# Patient Record
Sex: Female | Born: 1985 | Race: White | Hispanic: No | Marital: Married | Smoking: Never smoker
Health system: Southern US, Community
[De-identification: ages and names within clinical notes are randomized; demographics above are authoritative.]

## PROBLEM LIST (undated history)

## (undated) DIAGNOSIS — IMO0002 Reserved for concepts with insufficient information to code with codable children: Secondary | ICD-10-CM

## (undated) DIAGNOSIS — R51 Headache: Secondary | ICD-10-CM

## (undated) DIAGNOSIS — F4310 Post-traumatic stress disorder, unspecified: Secondary | ICD-10-CM

## (undated) DIAGNOSIS — Z8619 Personal history of other infectious and parasitic diseases: Secondary | ICD-10-CM

## (undated) DIAGNOSIS — G8929 Other chronic pain: Secondary | ICD-10-CM

## (undated) DIAGNOSIS — F329 Major depressive disorder, single episode, unspecified: Secondary | ICD-10-CM

## (undated) DIAGNOSIS — F909 Attention-deficit hyperactivity disorder, unspecified type: Secondary | ICD-10-CM

## (undated) DIAGNOSIS — J45909 Unspecified asthma, uncomplicated: Secondary | ICD-10-CM

## (undated) DIAGNOSIS — N390 Urinary tract infection, site not specified: Secondary | ICD-10-CM

## (undated) DIAGNOSIS — R519 Headache, unspecified: Secondary | ICD-10-CM

## (undated) DIAGNOSIS — E039 Hypothyroidism, unspecified: Secondary | ICD-10-CM

## (undated) DIAGNOSIS — M797 Fibromyalgia: Secondary | ICD-10-CM

## (undated) DIAGNOSIS — F32A Depression, unspecified: Secondary | ICD-10-CM

## (undated) DIAGNOSIS — M329 Systemic lupus erythematosus, unspecified: Secondary | ICD-10-CM

## (undated) DIAGNOSIS — E079 Disorder of thyroid, unspecified: Secondary | ICD-10-CM

## (undated) DIAGNOSIS — F419 Anxiety disorder, unspecified: Secondary | ICD-10-CM

## (undated) HISTORY — DX: Anxiety disorder, unspecified: F41.9

## (undated) HISTORY — DX: Personal history of other infectious and parasitic diseases: Z86.19

## (undated) HISTORY — DX: Major depressive disorder, single episode, unspecified: F32.9

## (undated) HISTORY — DX: Unspecified asthma, uncomplicated: J45.909

## (undated) HISTORY — DX: Depression, unspecified: F32.A

---
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
## 2007-01-01 ENCOUNTER — Encounter: Admission: RE | Admit: 2007-01-01 | Discharge: 2007-01-01 | Payer: Self-pay | Admitting: Internal Medicine

## 2007-09-30 ENCOUNTER — Emergency Department (HOSPITAL_COMMUNITY): Admission: EM | Admit: 2007-09-30 | Discharge: 2007-09-30 | Payer: Self-pay | Admitting: Emergency Medicine

## 2008-09-28 ENCOUNTER — Emergency Department (HOSPITAL_COMMUNITY): Admission: EM | Admit: 2008-09-28 | Discharge: 2008-09-28 | Payer: Self-pay | Admitting: Emergency Medicine

## 2010-06-16 ENCOUNTER — Encounter: Payer: Self-pay | Admitting: Internal Medicine

## 2010-09-03 LAB — COMPREHENSIVE METABOLIC PANEL
ALT: 11 U/L (ref 0–35)
AST: 19 U/L (ref 0–37)
Albumin: 3.8 g/dL (ref 3.5–5.2)
Alkaline Phosphatase: 61 U/L (ref 39–117)
Creatinine, Ser: 0.61 mg/dL (ref 0.4–1.2)
GFR calc Af Amer: >60 mL/min (ref >60)
Total Bilirubin: 0.4 mg/dL (ref 0.3–1.2)

## 2010-09-03 LAB — URINALYSIS, ROUTINE W REFLEX MICROSCOPIC
Glucose, UA: NEGATIVE mg/dL
Hgb urine dipstick: NEGATIVE
Specific Gravity, Urine: 1.028 (ref 1.005–1.030)
Urobilinogen, UA: 0.2 mg/dL (ref 0.0–1.0)

## 2010-09-03 LAB — CBC
HCT: 31.8 % — ABNORMAL LOW (ref 36.0–46.0)
Hemoglobin: 10.8 g/dL — ABNORMAL LOW (ref 12.0–15.0)
MCHC: 34.1 g/dL (ref 30.0–36.0)
MCV: 82.5 fL (ref 78.0–100.0)
Platelets: 276 10*3/uL (ref 150–400)
RBC: 3.85 MIL/uL — ABNORMAL LOW (ref 3.87–5.11)
RDW: 16.2 % — ABNORMAL HIGH (ref 11.5–15.5)
WBC: 9.8 10*3/uL (ref 4.0–10.5)

## 2010-09-03 LAB — DIFFERENTIAL
Basophils Absolute: 0.1 10*3/uL (ref 0.0–0.1)
Basophils Relative: 1 % (ref 0–1)
Eosinophils Absolute: 0.1 10*3/uL (ref 0.0–0.7)
Monocytes Absolute: 0.5 10*3/uL (ref 0.1–1.0)
Monocytes Relative: 5 % (ref 3–12)
Neutrophils Relative %: 73 % (ref 43–77)

## 2010-09-03 LAB — PREGNANCY, URINE: Preg Test, Ur: NEGATIVE

## 2010-09-03 LAB — LIPASE, BLOOD: Lipase: 22 U/L (ref 11–59)

## 2010-10-11 NOTE — Discharge Summary (Signed)
NAME:  ALAYSIA, LIGHTLE                         ACCOUNT NO.:  [id number]   MEDICAL RECORD NO.:  [id number]                   PATIENT TYPE:  INP   LOCATION:  9320                                 FACILITY:  [facility]   PHYSICIAN:  Charles A. Clearance Coots, M.D.             DATE OF BIRTH:  [birth year]   DATE OF ADMISSION:  01/11/2004  DATE OF DISCHARGE:  01/15/2004                                 DISCHARGE SUMMARY   ADMISSION DIAGNOSIS:  Right pyelonephritis.   DISCHARGE DIAGNOSIS:  Right pyelonephritis, status post intravenous  antibiotic therapy.  Discharged home much improved, in good condition.   REASON FOR ADMISSION:  A 25 year old white female, G0, who presents with  report of hematuria 1-1/2 weeks ago.  She was given Uristat by her sister  and felt better until the day of admission, when she complained of severe  back pain, right flank pain, nausea, and fever.   PAST MEDICAL HISTORY:  Surgery:  None.  Illnesses:  None.   MEDICATIONS:  Advil.   ALLERGIES:  No known drug allergies.   SOCIAL HISTORY:  Occasional tobacco.  Marijuana two weeks ago.   PHYSICAL EXAMINATION:  GENERAL:  A well-nourished, well-developed white  female in moderate distress.  VITAL SIGNS:  Temperature 102.3, pulse 118, respiratory rate 18, blood  pressure 110/53.  CHEST:  Lungs were clear to auscultation bilaterally.  CARDIAC:  Regular rate and rhythm.  ABDOMEN:  Soft, mild tenderness over the suprapubic.  BACK:  Positive right CVA tenderness.   ADMISSION LABORATORY DATA:  Hemoglobin 9.9, hematocrit 30, white blood cell  count 22,000, platelets 428,000.  Urinalysis:  Specific gravity 1.015,  hemoglobin trace, ketones 40, moderate leukocyte esterase.   HOSPITAL COURSE:  The patient was admitted and started on IV antibiotic  therapy.  She was much improved by hospital day #3 with no flank pain and no  fever, and she was discharged home on hospital day #4 with no flank pain and  afebrile for 48 hours.   DISCHARGE  LABORATORY DATA:  Urine culture:  75,000 E. coli, pansensitive.  Blood cultures are negative.  White blood cell count 18,800.   DISCHARGE DISPOSITION:  1. Medications:  Ceftin 500 mg twice a day for seven days.  2. Follow-up in the office in two weeks for scheduling of a urology     consultation.                                               Charles A. Clearance Coots, M.D.    CAH/MEDQ  D:  01/15/2004  T:  01/15/2004  Job:  [id number]

## 2012-05-08 ENCOUNTER — Emergency Department (HOSPITAL_COMMUNITY): Payer: BC Managed Care – PPO

## 2012-05-08 ENCOUNTER — Emergency Department (HOSPITAL_COMMUNITY)
Admission: EM | Admit: 2012-05-08 | Discharge: 2012-05-08 | Disposition: A | Discharge status: Home / Self Care | Payer: BC Managed Care – PPO | Attending: Emergency Medicine | Admitting: Emergency Medicine

## 2012-05-08 ENCOUNTER — Encounter (HOSPITAL_COMMUNITY): Payer: Self-pay | Admitting: Emergency Medicine

## 2012-05-08 DIAGNOSIS — R300 Dysuria: Secondary | ICD-10-CM | POA: Insufficient documentation

## 2012-05-08 DIAGNOSIS — R110 Nausea: Secondary | ICD-10-CM | POA: Insufficient documentation

## 2012-05-08 DIAGNOSIS — R109 Unspecified abdominal pain: Secondary | ICD-10-CM | POA: Insufficient documentation

## 2012-05-08 LAB — URINALYSIS, ROUTINE W REFLEX MICROSCOPIC
Glucose, UA: NEGATIVE mg/dL
Leukocytes, UA: NEGATIVE
Protein, ur: NEGATIVE mg/dL

## 2012-05-08 LAB — PREGNANCY, URINE: Preg Test, Ur: NEGATIVE

## 2012-05-08 LAB — POCT I-STAT, CHEM 8
Calcium, Ion: 1.22 mmol/L (ref 1.12–1.23)
Chloride: 106 mEq/L (ref 96–112)
HCT: 37 % (ref 36.0–46.0)
Hemoglobin: 12.6 g/dL (ref 12.0–15.0)
TCO2: 24 mmol/L (ref 0–100)

## 2012-05-08 LAB — POCT PREGNANCY, URINE: Preg Test, Ur: NEGATIVE

## 2012-05-08 MED ORDER — FENTANYL CITRATE 0.05 MG/ML IJ SOLN
100.0000 ug | Freq: Once | INTRAMUSCULAR | Status: DC
  Filled 2012-05-08: qty 2

## 2012-05-08 MED ORDER — HYDROCODONE-ACETAMINOPHEN 5-325 MG PO TABS
1.0000 | ORAL_TABLET | Freq: Once | ORAL | Status: AC
  Administered 2012-05-08: 1 via ORAL
  Filled 2012-05-08: qty 1

## 2012-05-08 MED ORDER — ONDANSETRON HCL 4 MG/2ML IJ SOLN
4.0000 mg | Freq: Once | INTRAMUSCULAR | Status: DC
  Filled 2012-05-08: qty 2

## 2012-05-08 MED ORDER — ONDANSETRON 4 MG PO TBDP
ORAL_TABLET | ORAL | Status: AC
  Administered 2012-05-08: 4 mg
  Filled 2012-05-08: qty 1

## 2012-05-08 MED ORDER — HYDROCODONE-ACETAMINOPHEN 5-325 MG PO TABS: 1.0000 | ORAL_TABLET | Freq: Four times a day (QID) | ORAL | Status: DC | PRN

## 2012-05-08 NOTE — ED Provider Notes (Signed)
History     CSN: [id number] Arrival date & time 05/08/12  1740 First MD Initiated Contact with Patient 05/08/12 1822      Chief Complaint  Patient presents with  . Flank Pain     HPI Pt had some dysuria a few days ago.  Prior to that she had a "stomach bug" but that resolved. She saw a PCP and was started on abx for a UTI 2 days ago. Since then  she has started to have pain in the left flank which was not there initially.  No fever.  No vomiting.  Still some nausea.  She has been trying [employer] [place] poweder witout relief.  History reviewed. No pertinent past medical history.  History reviewed. No pertinent past surgical history.  No family history on file.  History  Substance Use Topics  . Smoking status: Never Smoker   . Smokeless tobacco: Never Used  . Alcohol Use: Yes     Comment: rarely    OB History    Grav Para Term Preterm Abortions TAB SAB Ect Mult Living                  Review of Systems  All other systems reviewed and are negative.    Allergies  Review of patient's allergies indicates no known allergies.  Home Medications   Current Outpatient Rx  Name  Route  Sig  Dispense  Refill  . BUPROPION HCL ER (XL) 300 MG PO TB24   Oral   Take 300 mg by mouth daily.         Marland Kitchen CIPROFLOXACIN HCL 500 MG PO TABS   Oral   Take 500 mg by mouth 2 (two) times daily. For 10 days. Started on 05-06-12         . DROSPIRENONE-ETHINYL ESTRADIOL 3-0.03 MG PO TABS   Oral   Take 1 tablet by mouth daily.         Marland Kitchen ONDANSETRON 4 MG PO TBDP   Oral   Take 4 mg by mouth every 8 (eight) hours as needed. Nausea           BP 137/86  Pulse 103  Temp 98.5 F (36.9 C) (Oral)  Resp 14  SpO2 100%  LMP 03/21/2012  Physical Exam  Nursing note and vitals reviewed. Constitutional: She appears well-developed and well-nourished. No distress.  HENT:  Head: Normocephalic and atraumatic.  Right Ear: External ear normal.  Left Ear: External ear normal.  Eyes: Conjunctivae  normal are normal. Right eye exhibits no discharge. Left eye exhibits no discharge. No scleral icterus.  Neck: Neck supple. No tracheal deviation present.  Cardiovascular: Normal rate, regular rhythm and intact distal pulses.   Pulmonary/Chest: Effort normal and breath sounds normal. No stridor. No respiratory distress. She has no wheezes. She has no rales.  Abdominal: Soft. Bowel sounds are normal. She exhibits no distension. There is no tenderness. There is CVA tenderness. There is no rebound and no guarding.  Musculoskeletal: She exhibits no edema and no tenderness.  Neurological: She is alert. She has normal strength. No sensory deficit. Cranial nerve deficit:  no gross defecits noted. She exhibits normal muscle tone. She displays no seizure activity. Coordination normal.  Skin: Skin is warm and dry. No rash noted.  Psychiatric: She has a normal mood and affect.    ED Course  Procedures (including critical care time)  Labs Reviewed  URINALYSIS, ROUTINE W REFLEX MICROSCOPIC - Abnormal; Notable for the following:    Specific Gravity,  Urine 1.004 (*)     All other components within normal limits  POCT I-STAT, CHEM 8 - Abnormal; Notable for the following:    BUN 5 (*)     All other components within normal limits  POCT PREGNANCY, URINE  PREGNANCY, URINE   Ct Abdomen Pelvis Wo Contrast  05/08/2012  *RADIOLOGY REPORT*  Clinical Data: Left flank pain question kidney stone, negative pregnancy test  CT ABDOMEN AND PELVIS WITHOUT CONTRAST  Technique:  Multidetector CT imaging of the abdomen and pelvis was performed following the standard protocol without intravenous contrast. Sagittal and coronal MPR images reconstructed from axial data set.  Comparison: 09/05/2005  Findings: Lung bases clear. Tiny nonobstructing right renal calculi. Questionable tiny nonobstructing mid left renal calculus coronal image 38. No additional urinary tract calcification, hydronephrosis, or ureteral dilatation. Within  limits of a nonenhanced exam, liver, spleen, pancreas, kidneys, and adrenal glands unremarkable.  Normal appendix. Unremarkable bladder, uterus and adnexae. Stomach and bowel loops unremarkable for technique. Tiny amount of nonspecific free pelvic fluid. No definite mass, adenopathy, free air or inflammatory process. Few scattered normal-sized mesenteric nodes identified. No hernia or acute bony lesions.  IMPRESSION: Probable tiny bilateral nonobstructing renal calculi. No definite ureteral calcification or hydroureteronephrosis identified.   Original Report Authenticated By: Ulyses Southward, M.D.     Chief complaint: Flank Pain   MDM  The patient presented to the emergency room with complaints of persistent flank pain. Her urinalysis is reassuring. CT scan does not show evidence of a definite ureteral stone. She does have probable tiny bilateral nonobstructing renal calculi but I do not think that would be related to the flank pain that she is having today. At this time there does not appear to be evidence of an acute emergency medical condition. I will have her continue her antibiotics. I will give her prescription for medications take for pain        Celene Kras, MD 05/08/12 2034

## 2012-05-08 NOTE — ED Notes (Signed)
Pt presents w/ left flank pain, starting 2 days ago, nausea w/ emesis but has been getting over a stomach. Bug. Pt Dx at [facility] w/ UTI on Wednesday and has been taking her antibx, her urgency and frequency have subsided, flank pain is a new Sx

## 2012-05-08 NOTE — ED Notes (Signed)
Informed MD about pt's pain level.

## 2013-02-03 ENCOUNTER — Emergency Department (HOSPITAL_COMMUNITY)
Admission: EM | Admit: 2013-02-03 | Discharge: 2013-02-04 | Disposition: A | Discharge status: Home / Self Care | Payer: BC Managed Care – PPO | Attending: Emergency Medicine | Admitting: Emergency Medicine

## 2013-02-03 ENCOUNTER — Emergency Department (HOSPITAL_COMMUNITY): Payer: BC Managed Care – PPO

## 2013-02-03 ENCOUNTER — Encounter (HOSPITAL_COMMUNITY): Payer: Self-pay | Admitting: *Deleted

## 2013-02-03 DIAGNOSIS — H81399 Other peripheral vertigo, unspecified ear: Secondary | ICD-10-CM | POA: Insufficient documentation

## 2013-02-03 DIAGNOSIS — H9319 Tinnitus, unspecified ear: Secondary | ICD-10-CM | POA: Insufficient documentation

## 2013-02-03 DIAGNOSIS — Z3202 Encounter for pregnancy test, result negative: Secondary | ICD-10-CM | POA: Insufficient documentation

## 2013-02-03 DIAGNOSIS — Z79899 Other long term (current) drug therapy: Secondary | ICD-10-CM | POA: Insufficient documentation

## 2013-02-03 DIAGNOSIS — R112 Nausea with vomiting, unspecified: Secondary | ICD-10-CM | POA: Insufficient documentation

## 2013-02-03 DIAGNOSIS — J329 Chronic sinusitis, unspecified: Secondary | ICD-10-CM | POA: Insufficient documentation

## 2013-02-03 DIAGNOSIS — J3489 Other specified disorders of nose and nasal sinuses: Secondary | ICD-10-CM | POA: Insufficient documentation

## 2013-02-03 HISTORY — DX: Other chronic pain: G89.29

## 2013-02-03 HISTORY — DX: Headache, unspecified: R51.9

## 2013-02-03 HISTORY — DX: Headache: R51

## 2013-02-03 LAB — URINALYSIS, ROUTINE W REFLEX MICROSCOPIC
Bilirubin Urine: NEGATIVE
Hgb urine dipstick: NEGATIVE
Ketones, ur: NEGATIVE mg/dL
Protein, ur: NEGATIVE mg/dL
Urobilinogen, UA: 0.2 mg/dL (ref 0.0–1.0)

## 2013-02-03 MED ORDER — PROMETHAZINE HCL 25 MG/ML IJ SOLN
12.5000 mg | Freq: Once | INTRAMUSCULAR | Status: AC
  Administered 2013-02-03: 12.5 mg via INTRAVENOUS
  Filled 2013-02-03: qty 1

## 2013-02-03 MED ORDER — SODIUM CHLORIDE 0.9 % IV BOLUS (SEPSIS)
500.0000 mL | Freq: Once | INTRAVENOUS | Status: AC
  Administered 2013-02-03: 500 mL via INTRAVENOUS

## 2013-02-03 MED ORDER — SODIUM CHLORIDE 0.9 % IV SOLN: INTRAVENOUS | Status: DC

## 2013-02-03 MED ORDER — PROMETHAZINE HCL 25 MG PO TABS: 25.0000 mg | ORAL_TABLET | Freq: Four times a day (QID) | ORAL | Status: DC | PRN

## 2013-02-03 NOTE — ED Notes (Signed)
Patient transported to CT 

## 2013-02-03 NOTE — ED Notes (Signed)
Pt states today started having a headache, nausea/vomiting, and ears ringing.

## 2013-02-03 NOTE — ED Provider Notes (Signed)
CSN: [id number]     Arrival date & time 02/03/13  1718 History   First MD Initiated Contact with Patient 02/03/13 2127     Chief Complaint  Patient presents with  . Tinnitus  . Nausea  . Emesis  . Headache    HPI Pt was seen at 2140. Per pt, c/o gradual onset and persistence of constant "dizziness" for the past 1 week. Pt describes the dizziness as "everything is moving around." Has been associated with nausea, "ringing" in her ears, frontal headache, sinus and nasal congestion. Symptoms worsen when she changes body positions. Denies headache, no focal motor weakness, no tingling/numnbess in extremities, no visual changes, no CP/palpitations, no SOB/cough, no abd pain, no vomiting/diarrhea, no fevers, no rash, no head or neck injury, no syncope/near syncope.    Past Medical History  Diagnosis Date  . Chronic headache     History reviewed. No pertinent past surgical history.  History  Substance Use Topics  . Smoking status: Never Smoker   . Smokeless tobacco: Never Used  . Alcohol Use: Yes     Comment: rarely    Review of Systems ROS: Statement: All systems negative except as marked or noted in the HPI; Constitutional: Negative for fever and chills. ; ; Eyes: Negative for eye pain, redness and discharge. ; ; ENMT: Negative for ear pain, hoarseness, sore throat. +nasal congestion, sinus pressure, ears "ringing.";; Cardiovascular: Negative for chest pain, palpitations, diaphoresis, dyspnea and peripheral edema. ; ; Respiratory: Negative for cough, wheezing and stridor. ; ; Gastrointestinal: +nausea. Negative for vomiting, diarrhea, abdominal pain, blood in stool, hematemesis, jaundice and rectal bleeding. . ; ; Genitourinary: Negative for dysuria, flank pain and hematuria. ; ; Musculoskeletal: Negative for back pain and neck pain. Negative for swelling and trauma.; ; Skin: Negative for pruritus, rash, abrasions, blisters, bruising and skin lesion.; ; Neuro: +"dizziness." Negative for  lightheadedness and neck stiffness. Negative for weakness, altered level of consciousness , altered mental status, extremity weakness, paresthesias, involuntary movement, seizure and syncope.      Allergies  Review of patient's allergies indicates no known allergies.  Home Medications   Current Outpatient Rx  Name  Route  Sig  Dispense  Refill  . aspirin-acetaminophen-caffeine (EXCEDRIN MIGRAINE) 250-250-65 MG per tablet   Oral   Take 2 tablets by mouth every 6 (six) hours as needed for pain.         Marland Kitchen buPROPion (WELLBUTRIN XL) 300 MG 24 hr tablet   Oral   Take 300 mg by mouth daily.         . drospirenone-ethinyl estradiol (OCELLA) 3-0.03 MG tablet   Oral   Take 1 tablet by mouth daily.         Marland Kitchen HYDROcodone-acetaminophen (NORCO) 5-325 MG per tablet   Oral   Take 1-2 tablets by mouth every 6 (six) hours as needed for pain.   20 tablet   0    BP 135/92  Pulse 87  Temp(Src) 98.6 F (37 C) (Oral)  Resp 20  Ht 4\' 11"  (1.499 m)  Wt 98 lb (44.453 kg)  BMI 19.78 kg/m2  SpO2 100%  LMP 01/27/2013 Physical Exam 2145: Physical examination:  Nursing notes reviewed; Vital signs and O2 SAT reviewed;  Constitutional: Well developed, Well nourished, Well hydrated, In no acute distress; Head:  Normocephalic, atraumatic; Eyes: EOMI, PERRL, No scleral icterus; ENMT: TM's clear bilat. +edemetous nasal turbinates bilat with clear rhinorrhea.  Mouth and pharynx normal, Mucous membranes moist; Neck: Supple, Full range of  motion, No lymphadenopathy; Cardiovascular: Regular rate and rhythm, No murmur, rub, or gallop; Respiratory: Breath sounds clear & equal bilaterally, No rales, rhonchi, wheezes.  Speaking full sentences with ease, Normal respiratory effort/excursion; Chest: Nontender, Movement normal; Abdomen: Soft, Nontender, Nondistended, Normal bowel sounds; Genitourinary: No CVA tenderness; Extremities: Pulses normal, No tenderness, No edema, No calf edema or asymmetry.; Neuro: AA&Ox3,  Major CN grossly intact.  Strength 5/5 equal bilat UE's and LE's.  DTR 2/4 equal bilat UE's and LE's.  No gross sensory deficits.  Normal cerebellar testing bilat UE's (finger-nose) and LE's (heel-shin). Speech clear.  No facial droop.  +left horizontal end gaze fatigable nystagmus which reproduces pts symptoms.;; Skin: Color normal, Warm, Dry.   ED Course  Procedures    MDM  MDM Reviewed: previous chart, nursing note and vitals Interpretation: labs and CT scan     Results for orders placed during the hospital encounter of 02/03/13  PREGNANCY, URINE      Result Value Range   Preg Test, Ur NEGATIVE  NEGATIVE  URINALYSIS, ROUTINE W REFLEX MICROSCOPIC      Result Value Range   Color, Urine YELLOW  YELLOW   APPearance CLEAR  CLEAR   Specific Gravity, Urine 1.017  1.005 - 1.030   pH 6.5  5.0 - 8.0   Glucose, UA NEGATIVE  NEGATIVE mg/dL   Hgb urine dipstick NEGATIVE  NEGATIVE   Bilirubin Urine NEGATIVE  NEGATIVE   Ketones, ur NEGATIVE  NEGATIVE mg/dL   Protein, ur NEGATIVE  NEGATIVE mg/dL   Urobilinogen, UA 0.2  0.0 - 1.0 mg/dL   Nitrite NEGATIVE  NEGATIVE   Leukocytes, UA NEGATIVE  NEGATIVE   Ct Head Wo Contrast 02/03/2013   CLINICAL DATA:  Tinnitus, nausea, emesis, and headache.  EXAM: CT HEAD WITHOUT CONTRAST  TECHNIQUE: Contiguous axial images were obtained from the base of the skull through the vertex without intravenous contrast.  COMPARISON:  09/05/2005  FINDINGS: Ventricles and sulci appear symmetrical. No mass effect or midline shift. No abnormal extra-axial fluid collections. Gray-white matter junctions are distinct. Basal cisterns are not effaced. No evidence of acute intracranial hemorrhage. No depressed skull fractures. Visualized paranasal sinuses demonstrate mucosal thickening without acute air-fluid level. Mastoid air cells are not opacified.  IMPRESSION: No acute intracranial abnormalities.   Electronically Signed   By: Burman Nieves   On: 02/03/2013 22:43    2345:   Pt has ambulated while in the ED with steady gait. Denies "dizziness." States nausea has improved. Has tol PO well while in the ED without N/V. Wants to go home now. Dx and testing d/w pt and family.  Questions answered.  Verb understanding, agreeable to d/c home with outpt f/u.   Laray Anger, DO 02/07/13 [phone]

## 2013-02-03 NOTE — ED Notes (Signed)
Pt ambulated in hall with standby assist.  Pt denies any N/V or dizziness.

## 2013-02-03 NOTE — ED Notes (Signed)
Patient now consenting to CT. Cory in CT notified.

## 2013-02-03 NOTE — ED Notes (Signed)
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 Patient reports trying various home remedies such as lavander oil and nausea bands without relief. Patient feeling increased stress due to lack of insurance and concern on how she will pay for today's visit. Patient seen by Amy, Case Management.

## 2013-02-03 NOTE — ED Notes (Signed)
Pt states that standing still makes her more dizzy than walking around.

## 2013-02-03 NOTE — Progress Notes (Signed)
Patient confirms that she does not have a pcp.  Patient also reports that she does not have insurance anymore.  Patient confirms that she takes Welbutrin and sometimes has difficulty paying for it.  [facility] provided patient with a list of pcps who accept self pay patients, list of discount pharmacies and website needymeds.org fro medication assistance.  Patient reports she tried applying for Obama care and Medicaid but was unsuccessful.  EDCM provided patient information on the orange card.  Informed patient that the orange card is not insurance.  Also provided patient with information on the MAP program and infromed patient that she must find a pcp to be eligible for this program.  Patient verbalized understanding.  Patient thankful for resources.  No further needs at this time.

## 2013-03-09 ENCOUNTER — Ambulatory Visit: Payer: Self-pay | Attending: Internal Medicine

## 2013-03-09 ENCOUNTER — Ambulatory Visit: Payer: Self-pay | Attending: Internal Medicine | Admitting: Internal Medicine

## 2013-03-09 VITALS — BP 126/85 | HR 102 | Temp 98.2°F | Resp 17 | Wt 97.6 lb

## 2013-03-09 DIAGNOSIS — J329 Chronic sinusitis, unspecified: Secondary | ICD-10-CM | POA: Insufficient documentation

## 2013-03-09 DIAGNOSIS — J45909 Unspecified asthma, uncomplicated: Secondary | ICD-10-CM | POA: Insufficient documentation

## 2013-03-09 DIAGNOSIS — H8110 Benign paroxysmal vertigo, unspecified ear: Secondary | ICD-10-CM

## 2013-03-09 MED ORDER — MECLIZINE HCL 25 MG PO TABS: 25.0000 mg | ORAL_TABLET | Freq: Three times a day (TID) | ORAL | Status: DC | PRN

## 2013-03-09 MED ORDER — LORATADINE 10 MG PO TABS: 10.0000 mg | ORAL_TABLET | Freq: Every day | ORAL | Status: DC | PRN

## 2013-03-09 NOTE — Progress Notes (Signed)
Patient ID: Valerie Evans, female   DOB: 1985/06/13, 27 y.o.   MRN: [id number] Patient Demographics  Valerie Evans, is a 27 y.o. female  WUJ:[id number]  [id number]  DOB - Oct 06, 1985  Chief Complaint  Patient presents with  . Hospitalization Follow-up        Subjective:   Valerie Evans today is here to establish primary care.  The patient is a 27 year old female with history of asthma, only uses it is cueing he had as needed, was seen in the [facility] ED for vertigo. Patient reports that at that time she had sinusitis, was given Phenergan which did improve her symptoms of nausea however she felt sleepy and affecting schoolwork (currently in nursing school). She states that the right ear discomfort and the vertigo has improved.   Patient has No headache, No chest pain, No abdominal pain - No Nausea, No new weakness tingling or numbness, No Cough - SOB.    Objective:    Filed Vitals:   03/09/13 1030  BP: 126/85  Pulse: 102  Temp: 98.2 F (36.8 C)  Resp: 17  Weight: 97 lb 9.6 oz (44.271 kg)  SpO2: 100%     ALLERGIES:  No Known Allergies  PAST MEDICAL HISTORY: Past Medical History  Diagnosis Date  . Chronic headache     PAST SURGICAL HISTORY: No past surgical history on file.  FAMILY HISTORY: No family history on file.  MEDICATIONS AT HOME: Prior to Admission medications   Medication Sig Start Date End Date Taking? Authorizing Provider  aspirin-acetaminophen-caffeine (EXCEDRIN MIGRAINE) [phone] MG per tablet Take 2 tablets by mouth every 6 (six) hours as needed for pain.    Historical Provider, MD  buPROPion (WELLBUTRIN XL) 300 MG 24 hr tablet Take 300 mg by mouth daily.    Historical Provider, MD  drospirenone-ethinyl estradiol (OCELLA) 3-0.03 MG tablet Take 1 tablet by mouth daily.    Historical Provider, MD  HYDROcodone-acetaminophen (NORCO) 5-325 MG per tablet Take 1-2 tablets by mouth every 6 (six) hours as needed for pain. 05/08/12   Celene Kras, MD   loratadine (CLARITIN) 10 MG tablet Take 1 tablet (10 mg total) by mouth daily as needed for allergies. 03/09/13   Ripudeep Jenna Luo, MD  meclizine (ANTIVERT) 25 MG tablet Take 1 tablet (25 mg total) by mouth 3 (three) times daily as needed for dizziness (vertigo, also available OTC). 03/09/13   Ripudeep Jenna Luo, MD  promethazine (PHENERGAN) 25 MG tablet Take 1 tablet (25 mg total) by mouth every 6 (six) hours as needed for nausea (or dizziness). 02/03/13   Laray Anger, DO    REVIEW OF SYSTEMS:  Constitutional:   No   Fevers, chills, fatigue.  HEENT:    No headaches, Sore throat,   Cardio-vascular: No chest pain,  Orthopnea, swelling in lower extremities, anasarca, palpitations  GI:  No abdominal pain, nausea, vomiting, diarrhea  Resp: No shortness of breath,  No coughing up of blood.No cough.No wheezing.  Skin:  no rash or lesions.  GU:  no dysuria, change in color of urine, no urgency or frequency.  No flank pain.  Musculoskeletal: No joint pain or swelling.  No decreased range of motion.  No back pain.  Psych: No change in mood or affect. No depression or anxiety.  No memory loss.   Exam  General appearance :Awake, alert, NAD, Speech Clear. HEENT: Atraumatic and Normocephalic, PERLA both ears clean no significant discharge or tenderness.  Neck: supple, no JVD. No cervical lymphadenopathy.  Chest:  clear to auscultation bilaterally, no wheezing, rales or rhonchi CVS: S1 S2 regular, no murmurs.  Abdomen: soft, NBS, NT, ND, no gaurding, rigidity or rebound. Extremities: No cyanosis, clubbing, B/L Lower Ext shows no edema,  Neurology: Awake alert, and oriented X 3, CN II-XII intact, Non focal Skin:No Rash or lesions Wounds: N/A    Data Review   Basic Metabolic Panel: No results found for this basename: NA, K, CL, CO2, GLUCOSE, BUN, CREATININE, CALCIUM, MG, PHOS,  in the last 168 hours Liver Function Tests: No results found for this basename: AST, ALT, ALKPHOS,  BILITOT, PROT, ALBUMIN,  in the last 168 hours  CBC: No results found for this basename: WBC, NEUTROABS, HGB, HCT, MCV, PLT,  in the last 168 hours ------------------------------------------------------------------------------------------------------------------ No results found for this basename: HGBA1C,  in the last 72 hours ------------------------------------------------------------------------------------------------------------------ No results found for this basename: CHOL, HDL, LDLCALC, TRIG, CHOLHDL, LDLDIRECT,  in the last 72 hours ------------------------------------------------------------------------------------------------------------------ No results found for this basename: TSH, T4TOTAL, FREET3, T3FREE, THYROIDAB,  in the last 72 hours ------------------------------------------------------------------------------------------------------------------ No results found for this basename: VITAMINB12, FOLATE, FERRITIN, TIBC, IRON, RETICCTPCT,  in the last 72 hours  Coagulation profile  No results found for this basename: INR, PROTIME,  in the last 168 hours    Assessment & Plan   Active Problems: Sinusitis with vertigo - Place on claritin and meclizine PRN  History of chronic asthma: Currently stable - States that she does not need any albuterol inhaler at this time, has many doses and she does not use it daily  Health screening: Patient reports that she had a Pap smear this summer - States that she has received Flu shot through the school  Follow-up in 3 months PRN   RAI,RIPUDEEP M.D. 03/09/2013, 10:42 AM

## 2013-03-09 NOTE — Progress Notes (Signed)
Patient here for follow up from ED Was diagnosed with vertigo

## 2013-03-14 ENCOUNTER — Encounter (HOSPITAL_COMMUNITY): Payer: Self-pay | Admitting: Emergency Medicine

## 2013-03-14 ENCOUNTER — Emergency Department (INDEPENDENT_AMBULATORY_CARE_PROVIDER_SITE_OTHER)
Admission: EM | Admit: 2013-03-14 | Discharge: 2013-03-14 | Disposition: A | Discharge status: Home / Self Care | Payer: Self-pay | Source: Home / Self Care | Attending: Family Medicine | Admitting: Family Medicine

## 2013-03-14 DIAGNOSIS — F411 Generalized anxiety disorder: Secondary | ICD-10-CM

## 2013-03-14 DIAGNOSIS — F419 Anxiety disorder, unspecified: Secondary | ICD-10-CM

## 2013-03-14 LAB — POCT I-STAT, CHEM 8
BUN: 12 mg/dL (ref 6–23)
Sodium: 141 mEq/L (ref 135–145)
TCO2: 25 mmol/L (ref 0–100)

## 2013-03-14 MED ORDER — ALPRAZOLAM 0.25 MG PO TABS: 0.2500 mg | ORAL_TABLET | Freq: Three times a day (TID) | ORAL | Status: DC | PRN

## 2013-03-14 NOTE — ED Notes (Signed)
C/o feeling shaky, nausea, rapid heart rate, difficulty speaking, working hard to breath. States also experienced same symptoms yesterday morning. Hx of anxiety.  Pt has taking meclizine with some relief of symptoms.

## 2013-03-14 NOTE — ED Provider Notes (Signed)
CSN: [id number]     Arrival date & time 03/14/13  1141 History   First MD Initiated Contact with Patient 03/14/13 1348     Chief Complaint  Patient presents with  . Anxiety   (Consider location/radiation/quality/duration/timing/severity/associated sxs/prior Treatment) Patient is a 27 y.o. female presenting with anxiety. The history is provided by the patient. No language interpreter was used.  Anxiety This is a new problem. The current episode started 2 days ago. The problem occurs constantly. Associated symptoms include shortness of breath. Pertinent negatives include no chest pain. Nothing aggravates the symptoms. Nothing relieves the symptoms. She has tried nothing for the symptoms.  Pt complains of feeling tremulous yesterday.    Past Medical History  Diagnosis Date  . Chronic headache    History reviewed. No pertinent past surgical history. History reviewed. No pertinent family history. History  Substance Use Topics  . Smoking status: Never Smoker   . Smokeless tobacco: Never Used  . Alcohol Use: Yes     Comment: rarely   OB History   Grav Para Term Preterm Abortions TAB SAB Ect Mult Living                 Review of Systems  Respiratory: Positive for shortness of breath.   Cardiovascular: Negative for chest pain.  All other systems reviewed and are negative.    Allergies  Review of patient's allergies indicates no known allergies.  Home Medications   Current Outpatient Rx  Name  Route  Sig  Dispense  Refill  . buPROPion (WELLBUTRIN XL) 300 MG 24 hr tablet   Oral   Take 300 mg by mouth daily.         . drospirenone-ethinyl estradiol (OCELLA) 3-0.03 MG tablet   Oral   Take 1 tablet by mouth daily.         Marland Kitchen HYDROcodone-acetaminophen (NORCO) 5-325 MG per tablet   Oral   Take 1-2 tablets by mouth every 6 (six) hours as needed for pain.   20 tablet   0   . meclizine (ANTIVERT) 25 MG tablet   Oral   Take 1 tablet (25 mg total) by mouth 3 (three) times  daily as needed for dizziness (vertigo, also available OTC).   60 tablet   3   . aspirin-acetaminophen-caffeine (EXCEDRIN MIGRAINE) 250-250-65 MG per tablet   Oral   Take 2 tablets by mouth every 6 (six) hours as needed for pain.         Marland Kitchen loratadine (CLARITIN) 10 MG tablet   Oral   Take 1 tablet (10 mg total) by mouth daily as needed for allergies.   30 tablet   5   . promethazine (PHENERGAN) 25 MG tablet   Oral   Take 1 tablet (25 mg total) by mouth every 6 (six) hours as needed for nausea (or dizziness).   8 tablet   0    BP 126/87  Pulse 108  Temp(Src) 97.2 F (36.2 C) (Oral)  Resp 16  SpO2 100%  LMP 03/07/2013 Physical Exam  Nursing note and vitals reviewed. Constitutional: She is oriented to person, place, and time. She appears well-developed and well-nourished.  HENT:  Head: Normocephalic.  Right Ear: External ear normal.  Left Ear: External ear normal.  Nose: Nose normal.  Mouth/Throat: Oropharynx is clear and moist.  Eyes: Conjunctivae and EOM are normal. Pupils are equal, round, and reactive to light.  Neck: Normal range of motion. Neck supple.  Cardiovascular: Normal rate.   Pulmonary/Chest: Effort  normal.  Musculoskeletal: Normal range of motion.  Neurological: She is alert and oriented to person, place, and time. She has normal reflexes.  Skin: Skin is warm.  Psychiatric: She has a normal mood and affect.    ED Course  Procedures (including critical care time) Labs Review Labs Reviewed  POCT I-STAT, CHEM 8 - Abnormal; Notable for the following:    Calcium, Ion 1.26 (*)    Hemoglobin 15.6 (*)    All other components within normal limits   Imaging Review No results found.  EKG Interpretation     Ventricular Rate:    PR Interval:    QRS Duration:   QT Interval:    QTC Calculation:   R Axis:     Text Interpretation:              MDM   1. Anxiety     I advised pt to stop sudafed.  Encourage fluids.  Lonia Skinner [place],  PA-C 03/14/13 [phone]

## 2013-03-14 NOTE — ED Notes (Addendum)
Was asked to eval this pt on arrival by front office clerk. Pt c/o no energy, heart racing, feels hard to breathe, fingers and toes are tingling, arms shaking; w/d/color good NAD; alert conversant; denies pain; pt to wait in lobby until a bed is later available

## 2013-03-16 NOTE — ED Provider Notes (Signed)
Medical screening examination/treatment/procedure(s) were performed by resident physician or non-physician practitioner and as supervising physician I was immediately available for consultation/collaboration.   Lima Chillemi DOUGLAS MD.   Jariyah Hackley D Kaitlynn Tramontana, MD 03/16/13 2059 

## 2013-03-17 ENCOUNTER — Encounter: Payer: Self-pay | Admitting: Internal Medicine

## 2013-03-17 ENCOUNTER — Ambulatory Visit: Payer: No Typology Code available for payment source | Attending: Internal Medicine | Admitting: Internal Medicine

## 2013-03-17 VITALS — BP 118/76 | HR 89 | Temp 97.8°F | Ht 60.0 in | Wt 97.6 lb

## 2013-03-17 DIAGNOSIS — R4589 Other symptoms and signs involving emotional state: Secondary | ICD-10-CM

## 2013-03-17 DIAGNOSIS — F410 Panic disorder [episodic paroxysmal anxiety] without agoraphobia: Secondary | ICD-10-CM

## 2013-03-17 DIAGNOSIS — F411 Generalized anxiety disorder: Secondary | ICD-10-CM

## 2013-03-17 LAB — TSH: TSH: 2.304 u[IU]/mL (ref 0.350–4.500)

## 2013-03-17 MED ORDER — ALPRAZOLAM 0.25 MG PO TABS: 0.2500 mg | ORAL_TABLET | Freq: Three times a day (TID) | ORAL | Status: DC | PRN

## 2013-03-17 NOTE — Patient Instructions (Signed)

## 2013-03-17 NOTE — Progress Notes (Signed)
Patient ID: Valerie Evans, female   DOB: Jun 26, 1985, 27 y.o.   MRN: [id number] Patient Demographics  Valerie Evans, is a 27 y.o. female  WUJ:[id number]  [id number]  DOB - 07/18/1985  Chief Complaint  Patient presents with  . Anxiety        Subjective:   Valerie Evans is a 27 y.o. female here today for a follow up visit. Patient was seen in the ER recently because of panic attacks. 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 She is due to graduate in December, she is also looking for job, no distinct constitute stress giving her panic attacks. She holds after all this done she will be free of anxiety. She has history of depression. He is on Wellbutrin by psychiatrist. Today she has no symptoms. She is here for followup of ER visit. Patient has No headache, No chest pain, No abdominal pain - No Nausea, No new weakness tingling or numbness, No Cough - SOB.  ALLERGIES: No Known Allergies  PAST MEDICAL HISTORY: Past Medical History  Diagnosis Date  . Chronic headache     MEDICATIONS AT HOME: Prior to Admission medications   Medication Sig Start Date End Date Taking? Authorizing Provider  ALPRAZolam (XANAX) 0.25 MG tablet Take 1 tablet (0.25 mg total) by mouth 3 (three) times daily as needed for anxiety. 03/17/13  Yes Jeanann Lewandowsky, MD  aspirin-acetaminophen-caffeine (EXCEDRIN MIGRAINE) [phone] MG per tablet Take 2 tablets by mouth every 6 (six) hours as needed for pain.   Yes Historical Provider, MD  buPROPion (WELLBUTRIN XL) 300 MG 24 hr tablet Take 300 mg by mouth daily.   Yes Historical Provider, MD  drospirenone-ethinyl estradiol (OCELLA) 3-0.03 MG tablet Take 1 tablet by mouth daily.   Yes Historical Provider, MD  HYDROcodone-acetaminophen (NORCO) 5-325 MG per tablet Take 1-2 tablets by mouth every 6 (six) hours as needed for pain. 05/08/12  Yes Celene Kras, MD  loratadine (CLARITIN) 10 MG tablet Take 1 tablet (10 mg total)  by mouth daily as needed for allergies. 03/09/13  Yes Ripudeep Jenna Luo, MD  meclizine (ANTIVERT) 25 MG tablet Take 1 tablet (25 mg total) by mouth 3 (three) times daily as needed for dizziness (vertigo, also available OTC). 03/09/13  Yes Ripudeep Jenna Luo, MD  promethazine (PHENERGAN) 25 MG tablet Take 1 tablet (25 mg total) by mouth every 6 (six) hours as needed for nausea (or dizziness). 02/03/13  Yes Laray Anger, DO     Objective:   Filed Vitals:   03/17/13 0938  BP: 118/76  Pulse: 89  Temp: 97.8 F (36.6 C)  TempSrc: Oral  Height: 5' (1.524 m)  Weight: 97 lb 9.6 oz (44.271 kg)  SpO2: 100%    Exam General appearance : Awake, alert, not in any distress. Speech Clear. Not toxic looking HEENT: Atraumatic and Normocephalic, pupils equally reactive to light and accomodation Neck: supple, no JVD. No cervical lymphadenopathy.  Chest:Good air entry bilaterally, no added sounds  CVS: S1 S2 regular, no murmurs.  Abdomen: Bowel sounds present, Non tender and not distended with no gaurding, rigidity or rebound. Extremities: B/L Lower Ext shows no edema, both legs are warm to touch Neurology: Awake alert, and oriented X 3, CN II-XII intact, Non focal Skin:No Rash Wounds:N/A   Data Review   CBC  Recent Labs Lab 03/14/13 1509  HGB 15.6*  HCT 46.0    Chemistries    Recent Labs Lab 03/14/13 1509  NA  141  K 4.2  CL 103  GLUCOSE 80  BUN 12  CREATININE 1.00   ------------------------------------------------------------------------------------------------------------------ No results found for this basename: HGBA1C,  in the last 72 hours ------------------------------------------------------------------------------------------------------------------ No results found for this basename: CHOL, HDL, LDLCALC, TRIG, CHOLHDL, LDLDIRECT,  in the last 72 hours ------------------------------------------------------------------------------------------------------------------ No  results found for this basename: TSH, T4TOTAL, FREET3, T3FREE, THYROIDAB,  in the last 72 hours ------------------------------------------------------------------------------------------------------------------ No results found for this basename: VITAMINB12, FOLATE, FERRITIN, TIBC, IRON, RETICCTPCT,  in the last 72 hours  Coagulation profile  No results found for this basename: INR, PROTIME,  in the last 168 hours    Assessment & Plan   Patient Active Problem List   Diagnosis Date Noted  . Panic attack as reaction to stress 03/17/2013  . Generalized anxiety disorder 03/17/2013  . Sinusitis, chronic 03/09/2013  . Asthma, chronic 03/09/2013  . Benign paroxysmal positional vertigo 03/09/2013     Plan: Xanax 0.25 mg tablet by mouth 3 times a day when necessary anxiety Patient has been extensively counseled about panic attacks, behavioral modification therapy for anxiety  Labs: TSH  Follow up in 3 months when necessary  The patient was given clear instructions to go to ER or return to medical center if symptoms don't improve, worsen or new problems develop. The patient verbalized understanding. The patient was told to call to get lab results if they haven't heard anything in the next week.    Jeanann Lewandowsky, MD, MHA, FACP, FAAP [facility] [place], [place] [phone]   03/17/2013, 10:08 AM

## 2013-03-18 ENCOUNTER — Telehealth: Payer: Self-pay

## 2013-03-18 NOTE — Telephone Encounter (Signed)
Message copied by Lestine Mount on Fri Mar 18, 2013 11:32 AM ------      Message from: Quentin Angst      Created: Fri Mar 18, 2013  9:37 AM       Please inform patient that her TSH is normal ------

## 2013-03-18 NOTE — Telephone Encounter (Signed)
Patient is aware of her lab results 

## 2013-03-31 ENCOUNTER — Ambulatory Visit: Payer: No Typology Code available for payment source | Attending: Internal Medicine

## 2013-03-31 VITALS — BP 123/86 | HR 87 | Temp 98.7°F | Resp 16

## 2013-03-31 DIAGNOSIS — F410 Panic disorder [episodic paroxysmal anxiety] without agoraphobia: Secondary | ICD-10-CM

## 2013-03-31 DIAGNOSIS — F411 Generalized anxiety disorder: Secondary | ICD-10-CM

## 2013-03-31 MED ORDER — ALPRAZOLAM 0.25 MG PO TABS: 0.2500 mg | ORAL_TABLET | Freq: Three times a day (TID) | ORAL | Status: DC | PRN

## 2013-03-31 NOTE — Progress Notes (Unsigned)
Pt here for medication refill Xanax for severe anxiety Pt prescribed #30 10/23. Has 9 pills left. Taking 3/day

## 2013-04-10 ENCOUNTER — Emergency Department (INDEPENDENT_AMBULATORY_CARE_PROVIDER_SITE_OTHER)
Admission: EM | Admit: 2013-04-10 | Discharge: 2013-04-10 | Disposition: A | Discharge status: Home / Self Care | Payer: No Typology Code available for payment source | Source: Home / Self Care

## 2013-04-10 ENCOUNTER — Encounter (HOSPITAL_COMMUNITY): Payer: Self-pay | Admitting: Emergency Medicine

## 2013-04-10 DIAGNOSIS — R300 Dysuria: Secondary | ICD-10-CM

## 2013-04-10 DIAGNOSIS — N390 Urinary tract infection, site not specified: Secondary | ICD-10-CM

## 2013-04-10 HISTORY — DX: Urinary tract infection, site not specified: N39.0

## 2013-04-10 LAB — POCT URINALYSIS DIP (DEVICE)
Glucose, UA: 100 mg/dL — AB
Nitrite: POSITIVE — AB
Specific Gravity, Urine: <=1.005 (ref 1.005–1.030)
Urobilinogen, UA: 1 mg/dL (ref 0.0–1.0)

## 2013-04-10 MED ORDER — NITROFURANTOIN MONOHYD MACRO 100 MG PO CAPS: 100.0000 mg | ORAL_CAPSULE | Freq: Two times a day (BID) | ORAL | Status: DC

## 2013-04-10 NOTE — ED Provider Notes (Signed)
Medical screening examination/treatment/procedure(s) were performed by non-physician practitioner and as supervising physician I was immediately available for consultation/collaboration.  Leslee Home, M.D.   Reuben Likes, MD 04/10/13 [phone]

## 2013-04-10 NOTE — ED Notes (Signed)
Reports history of uti.  Patient has taken azo this am.  Reports noticing painful urination and frequency yesterday and symptoms have worsened.  Denies back pain, denies abdominal pain.

## 2013-04-10 NOTE — ED Provider Notes (Signed)
CSN: [id number]     Arrival date & time 04/10/13  0920 History   None    Chief Complaint  Patient presents with  . Urinary Tract Infection   (Consider location/radiation/quality/duration/timing/severity/associated sxs/prior Treatment) HPI Comments: Patient presents with a 2-3 day history of dysuria, hematuria and urgency. She denies abdominal or flank pain. No N or V. No CVA pain. No fever or chills.   Patient is a 27 y.o. female presenting with urinary tract infection. The history is provided by the patient.  Urinary Tract Infection    Past Medical History  Diagnosis Date  . Chronic headache   . UTI (lower urinary tract infection)    History reviewed. No pertinent past surgical history. History reviewed. No pertinent family history. History  Substance Use Topics  . Smoking status: Never Smoker   . Smokeless tobacco: Never Used  . Alcohol Use: Yes     Comment: rarely   OB History   Grav Para Term Preterm Abortions TAB SAB Ect Mult Living                 Review of Systems  All other systems reviewed and are negative.    Allergies  Review of patient's allergies indicates no known allergies.  Home Medications   Current Outpatient Rx  Name  Route  Sig  Dispense  Refill  . AMITRIPTYLINE HCL PO   Oral   Take by mouth.         . drospirenone-ethinyl estradiol (OCELLA) 3-0.03 MG tablet   Oral   Take 1 tablet by mouth daily.         Marland Kitchen HYDROcodone-acetaminophen (NORCO) 5-325 MG per tablet   Oral   Take 1-2 tablets by mouth every 6 (six) hours as needed for pain.   20 tablet   0   . meclizine (ANTIVERT) 25 MG tablet   Oral   Take 1 tablet (25 mg total) by mouth 3 (three) times daily as needed for dizziness (vertigo, also available OTC).   60 tablet   3   . ALPRAZolam (XANAX) 0.25 MG tablet   Oral   Take 1 tablet (0.25 mg total) by mouth 3 (three) times daily as needed for anxiety.   15 tablet   0   . aspirin-acetaminophen-caffeine (EXCEDRIN MIGRAINE)  250-250-65 MG per tablet   Oral   Take 2 tablets by mouth every 6 (six) hours as needed for pain.         Marland Kitchen buPROPion (WELLBUTRIN XL) 300 MG 24 hr tablet   Oral   Take 300 mg by mouth daily.         Marland Kitchen loratadine (CLARITIN) 10 MG tablet   Oral   Take 1 tablet (10 mg total) by mouth daily as needed for allergies.   30 tablet   5   . nitrofurantoin, macrocrystal-monohydrate, (MACROBID) 100 MG capsule   Oral   Take 1 capsule (100 mg total) by mouth 2 (two) times daily.   10 capsule   0   . promethazine (PHENERGAN) 25 MG tablet   Oral   Take 1 tablet (25 mg total) by mouth every 6 (six) hours as needed for nausea (or dizziness).   8 tablet   0    BP 113/69  Pulse 93  Temp(Src) 98.5 F (36.9 C) (Oral)  Resp 19  SpO2 95%  LMP 03/31/2013 Physical Exam  Nursing note and vitals reviewed. Constitutional: She is oriented to person, place, and time. She appears well-developed and well-nourished.  HENT:  Head: Normocephalic and atraumatic.  Cardiovascular: Normal rate and regular rhythm.   Pulmonary/Chest: Effort normal and breath sounds normal.  Abdominal: Soft. She exhibits no distension. There is no rebound and no guarding.  Neurological: She is alert and oriented to person, place, and time.  Skin: Skin is warm and dry.  Psychiatric: Her behavior is normal.    ED Course  Procedures (including critical care time) Labs Review Labs Reviewed  POCT URINALYSIS DIP (DEVICE) - Abnormal; Notable for the following:    Glucose, UA 100 (*)    Hgb urine dipstick SMALL (*)    Nitrite POSITIVE (*)    Leukocytes, UA SMALL (*)    All other components within normal limits   Imaging Review No results found.  EKG Interpretation     Ventricular Rate:    PR Interval:    QRS Duration:   QT Interval:    QTC Calculation:   R Axis:     Text Interpretation:              MDM   1. UTI (lower urinary tract infection)   2. Dysuria   Treat with Nitrofurantoin-Push  fluids     Riki Sheer, PA-C 04/10/13 1032

## 2013-04-20 ENCOUNTER — Encounter (HOSPITAL_COMMUNITY): Payer: Self-pay | Admitting: Emergency Medicine

## 2013-04-20 ENCOUNTER — Emergency Department (INDEPENDENT_AMBULATORY_CARE_PROVIDER_SITE_OTHER)
Admission: EM | Admit: 2013-04-20 | Discharge: 2013-04-20 | Disposition: A | Discharge status: Home / Self Care | Payer: No Typology Code available for payment source | Source: Home / Self Care

## 2013-04-20 DIAGNOSIS — R300 Dysuria: Secondary | ICD-10-CM

## 2013-04-20 DIAGNOSIS — N390 Urinary tract infection, site not specified: Secondary | ICD-10-CM

## 2013-04-20 LAB — POCT URINALYSIS DIP (DEVICE)
Ketones, ur: NEGATIVE mg/dL
Protein, ur: NEGATIVE mg/dL
Specific Gravity, Urine: 1.01 (ref 1.005–1.030)
pH: 7 (ref 5.0–8.0)

## 2013-04-20 MED ORDER — CEPHALEXIN 500 MG PO CAPS: 500.0000 mg | ORAL_CAPSULE | Freq: Four times a day (QID) | ORAL | Status: DC

## 2013-04-20 NOTE — ED Provider Notes (Signed)
CSN: [id number]     Arrival date & time 04/20/13  0807 History   None    No chief complaint on file.  (Consider location/radiation/quality/duration/timing/severity/associated sxs/prior Treatment) HPI Comments: 27 year old female was at this urgent care 10 days ago for UTI. She was treated with nitrofurantoin for 5 days. She is  continuing to have some dysuria ,left flank discomfort and mild suprapubic discomfort. Denies urgency.    Past Medical History  Diagnosis Date  . Chronic headache   . UTI (lower urinary tract infection)    No past surgical history on file. No family history on file. History  Substance Use Topics  . Smoking status: Never Smoker   . Smokeless tobacco: Never Used  . Alcohol Use: Yes     Comment: rarely   OB History   Grav Para Term Preterm Abortions TAB SAB Ect Mult Living                 Review of Systems  Constitutional: Negative.  Negative for fever, chills, activity change and fatigue.  Respiratory: Negative.   Gastrointestinal: Negative.   Genitourinary: Positive for dysuria and flank pain. Negative for hematuria, vaginal discharge, difficulty urinating, vaginal pain, menstrual problem and pelvic pain.  Musculoskeletal: Negative.   Neurological: Negative.     Allergies  Review of patient's allergies indicates no known allergies.  Home Medications   Current Outpatient Rx  Name  Route  Sig  Dispense  Refill  . ALPRAZolam (XANAX) 0.25 MG tablet   Oral   Take 1 tablet (0.25 mg total) by mouth 3 (three) times daily as needed for anxiety.   15 tablet   0   . AMITRIPTYLINE HCL PO   Oral   Take by mouth.         Marland Kitchen aspirin-acetaminophen-caffeine (EXCEDRIN MIGRAINE) 250-250-65 MG per tablet   Oral   Take 2 tablets by mouth every 6 (six) hours as needed for pain.         Marland Kitchen buPROPion (WELLBUTRIN XL) 300 MG 24 hr tablet   Oral   Take 300 mg by mouth daily.         . cephALEXin (KEFLEX) 500 MG capsule   Oral   Take 1 capsule (500 mg  total) by mouth 4 (four) times daily.   28 capsule   0   . drospirenone-ethinyl estradiol (OCELLA) 3-0.03 MG tablet   Oral   Take 1 tablet by mouth daily.         Marland Kitchen HYDROcodone-acetaminophen (NORCO) 5-325 MG per tablet   Oral   Take 1-2 tablets by mouth every 6 (six) hours as needed for pain.   20 tablet   0   . loratadine (CLARITIN) 10 MG tablet   Oral   Take 1 tablet (10 mg total) by mouth daily as needed for allergies.   30 tablet   5   . meclizine (ANTIVERT) 25 MG tablet   Oral   Take 1 tablet (25 mg total) by mouth 3 (three) times daily as needed for dizziness (vertigo, also available OTC).   60 tablet   3   . nitrofurantoin, macrocrystal-monohydrate, (MACROBID) 100 MG capsule   Oral   Take 1 capsule (100 mg total) by mouth 2 (two) times daily.   10 capsule   0   . promethazine (PHENERGAN) 25 MG tablet   Oral   Take 1 tablet (25 mg total) by mouth every 6 (six) hours as needed for nausea (or dizziness).   8 tablet  0    BP 162/85  Pulse 72  Temp(Src) 98.9 F (37.2 C) (Oral)  Resp 18  SpO2 98%  LMP 03/31/2013 Physical Exam  Nursing note and vitals reviewed. Constitutional: She is oriented to person, place, and time. She appears well-developed and well-nourished. No distress.  Eyes: EOM are normal.  Neck: Normal range of motion. Neck supple.  Cardiovascular: Normal rate.   Pulmonary/Chest: Effort normal. No respiratory distress.  Abdominal: Soft. She exhibits no distension and no mass. There is no tenderness. There is no rebound and no guarding.  Musculoskeletal: She exhibits no edema and no tenderness.  Neurological: She is alert and oriented to person, place, and time.  Skin: Skin is warm and dry.  Psychiatric: She has a normal mood and affect.    ED Course  Procedures (including critical care time) Labs Review Labs Reviewed  POCT URINALYSIS DIP (DEVICE) - Abnormal; Notable for the following:    Hgb urine dipstick TRACE (*)    All other  components within normal limits  URINE CULTURE   Imaging Review No results found. Results for orders placed during the hospital encounter of 04/20/13  POCT URINALYSIS DIP (DEVICE)      Result Value Range   Glucose, UA NEGATIVE  NEGATIVE mg/dL   Bilirubin Urine NEGATIVE  NEGATIVE   Ketones, ur NEGATIVE  NEGATIVE mg/dL   Specific Gravity, Urine 1.010  1.005 - 1.030   Hgb urine dipstick TRACE (*) NEGATIVE   pH 7.0  5.0 - 8.0   Protein, ur NEGATIVE  NEGATIVE mg/dL   Urobilinogen, UA 0.2  0.0 - 1.0 mg/dL   Nitrite NEGATIVE  NEGATIVE   Leukocytes, UA NEGATIVE  NEGATIVE      MDM   1. Dysuria      Urine culture ordered. Pt continues to have sx's with essentially nl urine. Suspect the course of ABX may have been short or not completely bacteriostatic but was effective to an extent. Keflex 500 qid for 7 d   Hayden Rasmussen, NP 04/20/13 4540  Hayden Rasmussen, NP 04/20/13 [phone]

## 2013-04-20 NOTE — ED Notes (Signed)
C/o UTI  Patient was here recently and could tell UTI wasn't getting better Antibiotics was given and finished.

## 2013-04-20 NOTE — ED Provider Notes (Signed)
Medical screening examination/treatment/procedure(s) were performed by non-physician practitioner and as supervising physician I was immediately available for consultation/collaboration.  Azalie Harbeck, M.D.  Imberly Troxler C Earla Charlie, MD 04/20/13 0902 

## 2013-04-21 LAB — URINE CULTURE: Colony Count: NO GROWTH

## 2013-06-09 ENCOUNTER — Ambulatory Visit: Payer: No Typology Code available for payment source | Attending: Internal Medicine | Admitting: Internal Medicine

## 2013-06-09 ENCOUNTER — Encounter: Payer: Self-pay | Admitting: Internal Medicine

## 2013-06-09 VITALS — BP 118/86 | HR 98 | Temp 98.0°F | Resp 16 | Ht 59.0 in | Wt 102.0 lb

## 2013-06-09 DIAGNOSIS — F430 Acute stress reaction: Secondary | ICD-10-CM

## 2013-06-09 DIAGNOSIS — F410 Panic disorder [episodic paroxysmal anxiety] without agoraphobia: Secondary | ICD-10-CM | POA: Insufficient documentation

## 2013-06-09 DIAGNOSIS — F411 Generalized anxiety disorder: Secondary | ICD-10-CM | POA: Insufficient documentation

## 2013-06-09 DIAGNOSIS — R4589 Other symptoms and signs involving emotional state: Secondary | ICD-10-CM

## 2013-06-09 MED ORDER — ALPRAZOLAM 0.25 MG PO TABS: 0.2500 mg | ORAL_TABLET | Freq: Three times a day (TID) | ORAL | Status: DC | PRN

## 2013-06-09 NOTE — Patient Instructions (Signed)

## 2013-06-09 NOTE — Progress Notes (Signed)
Patient ID: Valerie Evans, female   DOB: 03/08/1986, 28 y.o.   MRN: [id number] Patient Demographics  Valerie Evans, is a 28 y.o. female  [id number]  [id number]  DOB - 02/14/1986  Chief Complaint  Patient presents with  . Follow-up        Subjective:   Valerie Evans is a 28 y.o. female here today for a follow up visit. Patient is known to have generalized anxiety disorder, panic attacks and depression. She is here for followup. She has an exam coming up and she is being very anxious, having palpitations. She claims to have a support system now in a boyfriend, she was seen in the mental health department recently, her Wellbutrin was changed to amitriptyline, she claims amitriptyline is not working as well. She doesn't sleep well, she has headaches when she does not have a good sleep. 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 Patient has No headache, No chest pain, No abdominal pain - No Nausea, No new weakness tingling or numbness, No Cough - SOB.  ALLERGIES: No Known Allergies  PAST MEDICAL HISTORY: Past Medical History  Diagnosis Date  . Chronic headache   . UTI (lower urinary tract infection)   . Anxiety   . Depression     MEDICATIONS AT HOME: Prior to Admission medications   Medication Sig Start Date End Date Taking? Authorizing Provider  ALPRAZolam (XANAX) 0.25 MG tablet Take 1 tablet (0.25 mg total) by mouth 3 (three) times daily as needed for anxiety. 06/09/13  Yes Jeanann Lewandowskylugbemiga Damoney Julia, MD  AMITRIPTYLINE HCL PO Take by mouth.   Yes Historical Provider, MD  aspirin-acetaminophen-caffeine (EXCEDRIN MIGRAINE) [phone] MG per tablet Take 2 tablets by mouth every 6 (six) hours as needed for pain.   Yes Historical Provider, MD  drospirenone-ethinyl estradiol (OCELLA) 3-0.03 MG tablet Take 1 tablet by mouth daily.   Yes Historical Provider, MD  loratadine (CLARITIN) 10 MG tablet Take 1 tablet (10 mg total) by mouth daily as needed for allergies. 03/09/13  Yes  Ripudeep Jenna LuoK Rai, MD  meclizine (ANTIVERT) 25 MG tablet Take 1 tablet (25 mg total) by mouth 3 (three) times daily as needed for dizziness (vertigo, also available OTC). 03/09/13  Yes Ripudeep Jenna LuoK Rai, MD  buPROPion (WELLBUTRIN XL) 300 MG 24 hr tablet Take 300 mg by mouth daily.    Historical Provider, MD  cephALEXin (KEFLEX) 500 MG capsule Take 1 capsule (500 mg total) by mouth 4 (four) times daily. 04/20/13   Hayden Rasmussenavid Mabe, NP  HYDROcodone-acetaminophen (NORCO) 5-325 MG per tablet Take 1-2 tablets by mouth every 6 (six) hours as needed for pain. 05/08/12   Celene KrasJon R Knapp, MD  nitrofurantoin, macrocrystal-monohydrate, (MACROBID) 100 MG capsule Take 1 capsule (100 mg total) by mouth 2 (two) times daily. 04/10/13   Riki SheerMichelle G Young, PA-C  promethazine (PHENERGAN) 25 MG tablet Take 1 tablet (25 mg total) by mouth every 6 (six) hours as needed for nausea (or dizziness). 02/03/13   Laray AngerKathleen M McManus, DO     Objective:   Filed Vitals:   06/09/13 1014  BP: 118/86  Pulse: 98  Temp: 98 F (36.7 C)  TempSrc: Oral  Resp: 16  Height: 4\' 11"  (1.499 m)  Weight: 102 lb (46.267 kg)  SpO2: 100%    Exam General appearance : Awake, alert, not in any distress. Speech Clear. Not toxic looking HEENT: Atraumatic and Normocephalic, pupils equally reactive to light and accomodation Neck: supple, no JVD. No cervical lymphadenopathy.  Chest:Good air  entry bilaterally, no added sounds  CVS: S1 S2 regular, no murmurs.  Abdomen: Bowel sounds present, Non tender and not distended with no gaurding, rigidity or rebound. Extremities: B/L Lower Ext shows no edema, both legs are warm to touch Neurology: Awake alert, and oriented X 3, CN II-XII intact, Non focal Skin:No Rash Wounds:N/A   Data Review   CBC No results found for this basename: WBC, HGB, HCT, PLT, MCV, MCH, MCHC, RDW, NEUTRABS, LYMPHSABS, MONOABS, EOSABS, BASOSABS, BANDABS, BANDSABD,  in the last 168 hours  Chemistries   No results found for this  basename: NA, K, CL, CO2, GLUCOSE, BUN, CREATININE, GFRCGP, CALCIUM, MG, AST, ALT, ALKPHOS, BILITOT,  in the last 168 hours ------------------------------------------------------------------------------------------------------------------ No results found for this basename: HGBA1C,  in the last 72 hours ------------------------------------------------------------------------------------------------------------------ No results found for this basename: CHOL, HDL, LDLCALC, TRIG, CHOLHDL, LDLDIRECT,  in the last 72 hours ------------------------------------------------------------------------------------------------------------------ No results found for this basename: TSH, T4TOTAL, FREET3, T3FREE, THYROIDAB,  in the last 72 hours ------------------------------------------------------------------------------------------------------------------ No results found for this basename: VITAMINB12, FOLATE, FERRITIN, TIBC, IRON, RETICCTPCT,  in the last 72 hours  Coagulation profile  No results found for this basename: INR, PROTIME,  in the last 168 hours    Assessment & Plan   1. Panic attack as reaction to stress  - ALPRAZolam (XANAX) 0.25 MG tablet; Take 1 tablet (0.25 mg total) by mouth 3 (three) times daily as needed for anxiety.  Dispense: 30 tablet; Refill: 0  2. Generalized anxiety disorder  - ALPRAZolam (XANAX) 0.25 MG tablet; Take 1 tablet (0.25 mg total) by mouth 3 (three) times daily as needed for anxiety.  Dispense: 30 tablet; Refill: 0  Patient was again encouraged to follow mental health department Patient was extensively counseled about nutrition and exercise  patient was encouraged to engage in more social activities  Follow up in 6 months or when necessary   The patient was given clear instructions to go to ER or return to medical center if symptoms don't improve, worsen or new problems develop. The patient verbalized understanding. The patient was told to call to get lab  results if they haven't heard anything in the next week.    Jeanann Lewandowsky, MD, MHA, FACP, FAAP [facility] [place], [place] [phone]   06/09/2013, 10:40 AM

## 2013-06-09 NOTE — Progress Notes (Signed)
Pt is here following up on her anxiety and depression.

## 2013-07-04 ENCOUNTER — Encounter (HOSPITAL_COMMUNITY): Payer: Self-pay | Admitting: Emergency Medicine

## 2013-07-04 ENCOUNTER — Emergency Department (INDEPENDENT_AMBULATORY_CARE_PROVIDER_SITE_OTHER)
Admission: EM | Admit: 2013-07-04 | Discharge: 2013-07-04 | Disposition: A | Discharge status: Home / Self Care | Payer: Self-pay | Source: Home / Self Care

## 2013-07-04 DIAGNOSIS — R0600 Dyspnea, unspecified: Secondary | ICD-10-CM

## 2013-07-04 DIAGNOSIS — R0609 Other forms of dyspnea: Secondary | ICD-10-CM

## 2013-07-04 DIAGNOSIS — J029 Acute pharyngitis, unspecified: Secondary | ICD-10-CM

## 2013-07-04 DIAGNOSIS — R0989 Other specified symptoms and signs involving the circulatory and respiratory systems: Secondary | ICD-10-CM

## 2013-07-04 LAB — POCT RAPID STREP A: STREPTOCOCCUS, GROUP A SCREEN (DIRECT): NEGATIVE

## 2013-07-04 MED ORDER — ACETAMINOPHEN-CODEINE #3 300-30 MG PO TABS: 1.0000 | ORAL_TABLET | ORAL | Status: DC | PRN

## 2013-07-04 NOTE — ED Notes (Signed)
C/o severe sore throat.  Pain with swallowing. Dry nonproductive cough.  Runny nose and fever on Saturday.  Denies vomiting and diarrhea.  Mild relief with otc meds.

## 2013-07-04 NOTE — Discharge Instructions (Signed)
Pharyngitis °Pharyngitis is redness, pain, and swelling (inflammation) of your pharynx.  °CAUSES  °Pharyngitis is usually caused by infection. Most of the time, these infections are from viruses (viral) and are part of a cold. However, sometimes pharyngitis is caused by bacteria (bacterial). Pharyngitis can also be caused by allergies. Viral pharyngitis may be spread from person to person by coughing, sneezing, and personal items or utensils (cups, forks, spoons, toothbrushes). Bacterial pharyngitis may be spread from person to person by more intimate contact, such as kissing.  °SIGNS AND SYMPTOMS  °Symptoms of pharyngitis include:   °· Sore throat.   °· Tiredness (fatigue).   °· Low-grade fever.   °· Headache. °· Joint pain and muscle aches. °· Skin rashes. °· Swollen lymph nodes. °· Plaque-like film on throat or tonsils (often seen with bacterial pharyngitis). °DIAGNOSIS  °Your health care provider will ask you questions about your illness and your symptoms. Your medical history, along with a physical exam, is often all that is needed to diagnose pharyngitis. Sometimes, a rapid strep test is done. Other lab tests may also be done, depending on the suspected cause.  °TREATMENT  °Viral pharyngitis will usually get better in 3 4 days without the use of medicine. Bacterial pharyngitis is treated with medicines that kill germs (antibiotics).  °HOME CARE INSTRUCTIONS  °· Drink enough water and fluids to keep your urine clear or pale yellow.   °· Only take over-the-counter or prescription medicines as directed by your health care provider:   °· If you are prescribed antibiotics, make sure you finish them even if you start to feel better.   °· Do not take aspirin.   °· Get lots of rest.   °· Gargle with 8 oz of salt water (½ tsp of salt per 1 qt of water) as often as every 1 2 hours to soothe your throat.   °· Throat lozenges (if you are not at risk for choking) or sprays may be used to soothe your throat. °SEEK MEDICAL  CARE IF:  °· You have large, tender lumps in your neck. °· You have a rash. °· You cough up green, yellow-brown, or bloody spit. °SEEK IMMEDIATE MEDICAL CARE IF:  °· Your neck becomes stiff. °· You drool or are unable to swallow liquids. °· You vomit or are unable to keep medicines or liquids down. °· You have severe pain that does not go away with the use of recommended medicines. °· You have trouble breathing (not caused by a stuffy nose). °MAKE SURE YOU:  °· Understand these instructions. °· Will watch your condition. °· Will get help right away if you are not doing well or get worse. °Document Released: 05/12/2005 Document Revised: 03/02/2013 Document Reviewed: 01/17/2013 °ExitCare® Patient Information ©2014 [employer]. ° °Sore Throat °A sore throat is pain, burning, irritation, or scratchiness of the throat. There is often pain or tenderness when swallowing or talking. A sore throat may be accompanied by other symptoms, such as coughing, sneezing, fever, and swollen neck glands. A sore throat is often the first sign of another sickness, such as a cold, flu, strep throat, or mononucleosis (commonly known as mono). Most sore throats go away without medical treatment. °CAUSES  °The most common causes of a sore throat include: °· A viral infection, such as a cold, flu, or mono. °· A bacterial infection, such as strep throat, tonsillitis, or whooping cough. °· Seasonal allergies. °· Dryness in the air. °· Irritants, such as smoke or pollution. °· Gastroesophageal reflux disease (GERD). °HOME CARE INSTRUCTIONS  °· Only take over-the-counter   medicines as directed by your caregiver. °· Drink enough fluids to keep your urine clear or pale yellow. °· Rest as needed. °· Try using throat sprays, lozenges, or sucking on hard candy to ease any pain (if older than 4 years or as directed). °· Sip warm liquids, such as broth, herbal tea, or warm water with honey to relieve pain temporarily. You may also eat or drink cold or  frozen liquids such as frozen ice pops. °· Gargle with salt water (mix 1 tsp salt with 8 oz of water). °· Do not smoke and avoid secondhand smoke. °· Put a cool-mist humidifier in your bedroom at night to moisten the air. You can also turn on a hot shower and sit in the bathroom with the door closed for 5 10 minutes. °SEEK IMMEDIATE MEDICAL CARE IF: °· You have difficulty breathing. °· You are unable to swallow fluids, soft foods, or your saliva. °· You have increased swelling in the throat. °· Your sore throat does not get better in 7 days. °· You have nausea and vomiting. °· You have a fever or persistent symptoms for more than 2 3 days. °· You have a fever and your symptoms suddenly get worse. °MAKE SURE YOU:  °· Understand these instructions. °· Will watch your condition. °· Will get help right away if you are not doing well or get worse. °Document Released: 06/19/2004 Document Revised: 04/28/2012 Document Reviewed: 01/18/2012 °ExitCare® Patient Information ©2014 [employer]. ° °

## 2013-07-04 NOTE — ED Provider Notes (Signed)
CSN: [id number]     Arrival date & time 07/04/13  1310 History   First MD Initiated Contact with Patient 07/04/13 1409     Chief Complaint  Patient presents with  . Sore Throat  . Cough     (Consider location/radiation/quality/duration/timing/severity/associated sxs/prior Treatment) HPI Comments: 28 year old female presents with a sore throat for 4 days. She states the pain is worse over the weekend but still is "bad". She has had mild PND associated with a loss of voice. Today she developed a cough which she describes as a dry nonproductive. [name] had no fevers since Saturday, 2 days ago,.   Past Medical History  Diagnosis Date  . Chronic headache   . UTI (lower urinary tract infection)   . Anxiety   . Depression    History reviewed. No pertinent past surgical history. Family History  Problem Relation Age of Onset  . Hypertension Mother   . Depression Mother   . Heart disease Father   . Depression Father   . Cancer Maternal Aunt    History  Substance Use Topics  . Smoking status: Never Smoker   . Smokeless tobacco: Never Used  . Alcohol Use: Yes     Comment: rarely   OB History   Grav Para Term Preterm Abortions TAB SAB Ect Mult Living                 Review of Systems  Constitutional: Positive for fever. Negative for activity change and appetite change.  HENT: Positive for postnasal drip and rhinorrhea.   Respiratory: Positive for cough. Negative for wheezing.   Gastrointestinal: Negative.   Genitourinary: Negative.   Musculoskeletal: Negative.       Allergies  Review of patient's allergies indicates no known allergies.  Home Medications   Current Outpatient Rx  Name  Route  Sig  Dispense  Refill  . ALPRAZolam (XANAX) 0.25 MG tablet   Oral   Take 1 tablet (0.25 mg total) by mouth 3 (three) times daily as needed for anxiety.   30 tablet   0   . AMITRIPTYLINE HCL PO   Oral   Take by mouth.         . drospirenone-ethinyl estradiol (OCELLA) 3-0.03  MG tablet   Oral   Take 1 tablet by mouth daily.         Marland Kitchen acetaminophen-codeine (TYLENOL #3) 300-30 MG per tablet   Oral   Take 1 tablet by mouth every 4 (four) hours as needed for moderate pain.   15 tablet   0   . aspirin-acetaminophen-caffeine (EXCEDRIN MIGRAINE) 250-250-65 MG per tablet   Oral   Take 2 tablets by mouth every 6 (six) hours as needed for pain.         Marland Kitchen buPROPion (WELLBUTRIN XL) 300 MG 24 hr tablet   Oral   Take 300 mg by mouth daily.         . cephALEXin (KEFLEX) 500 MG capsule   Oral   Take 1 capsule (500 mg total) by mouth 4 (four) times daily.   28 capsule   0   . HYDROcodone-acetaminophen (NORCO) 5-325 MG per tablet   Oral   Take 1-2 tablets by mouth every 6 (six) hours as needed for pain.   20 tablet   0   . loratadine (CLARITIN) 10 MG tablet   Oral   Take 1 tablet (10 mg total) by mouth daily as needed for allergies.   30 tablet   5   .  meclizine (ANTIVERT) 25 MG tablet   Oral   Take 1 tablet (25 mg total) by mouth 3 (three) times daily as needed for dizziness (vertigo, also available OTC).   60 tablet   3   . nitrofurantoin, macrocrystal-monohydrate, (MACROBID) 100 MG capsule   Oral   Take 1 capsule (100 mg total) by mouth 2 (two) times daily.   10 capsule   0   . promethazine (PHENERGAN) 25 MG tablet   Oral   Take 1 tablet (25 mg total) by mouth every 6 (six) hours as needed for nausea (or dizziness).   8 tablet   0    BP 115/72  Pulse 109  Temp(Src) 98.2 F (36.8 C) (Oral)  Resp 20  SpO2 100%  LMP 05/23/2013 Physical Exam  Nursing note and vitals reviewed. Constitutional: She is oriented to person, place, and time. She appears well-developed and well-nourished. No distress.  HENT:  Bilateral TMs are normal Oral pharynx is erythematous, no exudates.  Eyes: Conjunctivae and EOM are normal.  Neck: Normal range of motion. Neck supple.  Cardiovascular: Normal rate, regular rhythm and normal heart sounds.    Pulmonary/Chest: Effort normal and breath sounds normal. No respiratory distress. She has no wheezes. She has no rales.  Abdominal: Soft. There is no tenderness.  Lymphadenopathy:    She has cervical adenopathy.  Neurological: She is alert and oriented to person, place, and time. She exhibits normal muscle tone.  Skin: Skin is warm.  Psychiatric: She has a normal mood and affect.    ED Course  Procedures (including critical care time) Labs Review Labs Reviewed - No data to display Imaging Review No results found.    MDM  Pharyngitis PND Final diagnoses:  None     Continue to Chloraseptic lozenges Ibuprofen 600 mg every 6-8 hours Drink plenty of fluids and stay well-hydrated Tylenol No. 3 one every 4 hours when necessary pain Followup with her PCP as needed.  Hayden Rasmussenavid Kayliegh Boyers, NP 07/04/13 [phone]

## 2013-07-06 LAB — CULTURE, GROUP A STREP

## 2013-07-06 NOTE — ED Provider Notes (Signed)
Medical screening examination/treatment/procedure(s) were performed by resident physician or non-physician practitioner and as supervising physician I was immediately available for consultation/collaboration.   Barkley BrunsKINDL,JAMES DOUGLAS MD.   Linna HoffJames D Kindl, MD 07/06/13 [phone]

## 2014-03-05 ENCOUNTER — Emergency Department (HOSPITAL_COMMUNITY)
Admission: EM | Admit: 2014-03-05 | Discharge: 2014-03-05 | Disposition: A | Discharge status: Home / Self Care | Payer: 59 | Attending: Emergency Medicine | Admitting: Emergency Medicine

## 2014-03-05 ENCOUNTER — Encounter (HOSPITAL_COMMUNITY): Payer: Self-pay | Admitting: Emergency Medicine

## 2014-03-05 ENCOUNTER — Emergency Department (HOSPITAL_COMMUNITY): Payer: 59

## 2014-03-05 DIAGNOSIS — Z79899 Other long term (current) drug therapy: Secondary | ICD-10-CM | POA: Insufficient documentation

## 2014-03-05 DIAGNOSIS — R519 Headache, unspecified: Secondary | ICD-10-CM

## 2014-03-05 DIAGNOSIS — Z87440 Personal history of urinary (tract) infections: Secondary | ICD-10-CM | POA: Diagnosis not present

## 2014-03-05 DIAGNOSIS — Z792 Long term (current) use of antibiotics: Secondary | ICD-10-CM | POA: Diagnosis not present

## 2014-03-05 DIAGNOSIS — F419 Anxiety disorder, unspecified: Secondary | ICD-10-CM | POA: Diagnosis not present

## 2014-03-05 DIAGNOSIS — R531 Weakness: Secondary | ICD-10-CM | POA: Diagnosis not present

## 2014-03-05 DIAGNOSIS — R51 Headache: Secondary | ICD-10-CM | POA: Insufficient documentation

## 2014-03-05 DIAGNOSIS — R55 Syncope and collapse: Secondary | ICD-10-CM | POA: Insufficient documentation

## 2014-03-05 DIAGNOSIS — F329 Major depressive disorder, single episode, unspecified: Secondary | ICD-10-CM | POA: Insufficient documentation

## 2014-03-05 DIAGNOSIS — Z3202 Encounter for pregnancy test, result negative: Secondary | ICD-10-CM | POA: Diagnosis not present

## 2014-03-05 DIAGNOSIS — G8929 Other chronic pain: Secondary | ICD-10-CM | POA: Diagnosis not present

## 2014-03-05 LAB — CBG MONITORING, ED: Glucose-Capillary: 70 mg/dL (ref 70–99)

## 2014-03-05 LAB — URINE MICROSCOPIC-ADD ON

## 2014-03-05 LAB — BASIC METABOLIC PANEL
Anion gap: 13 (ref 5–15)
BUN: 8 mg/dL (ref 6–23)
CALCIUM: 9.8 mg/dL (ref 8.4–10.5)
CO2: 27 mEq/L (ref 19–32)
CREATININE: 0.73 mg/dL (ref 0.50–1.10)
Chloride: 102 mEq/L (ref 96–112)
GFR calc Af Amer: >90 mL/min (ref >90)
GLUCOSE: 94 mg/dL (ref 70–99)
POTASSIUM: 3.8 meq/L (ref 3.7–5.3)
Sodium: 142 mEq/L (ref 137–147)

## 2014-03-05 LAB — URINALYSIS, ROUTINE W REFLEX MICROSCOPIC
Bilirubin Urine: NEGATIVE
Glucose, UA: NEGATIVE mg/dL
Ketones, ur: NEGATIVE mg/dL
Leukocytes, UA: NEGATIVE
NITRITE: NEGATIVE
Protein, ur: NEGATIVE mg/dL
SPECIFIC GRAVITY, URINE: 1.01 (ref 1.005–1.030)
UROBILINOGEN UA: 0.2 mg/dL (ref 0.0–1.0)
pH: 6 (ref 5.0–8.0)

## 2014-03-05 LAB — CBC
HEMATOCRIT: 40.4 % (ref 36.0–46.0)
HEMOGLOBIN: 13.9 g/dL (ref 12.0–15.0)
MCH: 30.9 pg (ref 26.0–34.0)
MCHC: 34.4 g/dL (ref 30.0–36.0)
MCV: 89.8 fL (ref 78.0–100.0)
Platelets: 241 10*3/uL (ref 150–400)
RBC: 4.5 MIL/uL (ref 3.87–5.11)
RDW: 12.3 % (ref 11.5–15.5)
WBC: 6.1 10*3/uL (ref 4.0–10.5)

## 2014-03-05 LAB — PREGNANCY, URINE: PREG TEST UR: NEGATIVE

## 2014-03-05 NOTE — ED Notes (Signed)
Pt states that she saw the door of room open and close that didn't, denies hearing voices, hx of depression, pt denies SI or HI

## 2014-03-05 NOTE — ED Notes (Addendum)
Pt was working upstairs and had a "[facility] Pt reports feels weak,lethargic and " I can't think." pt reports dizziness with movement.

## 2014-03-05 NOTE — ED Provider Notes (Signed)
CSN: [id number]     Arrival date & time 03/05/14  1617 History  This chart was scribed for Geoffery Lyonsouglas Veto Macqueen, MD by Tonye RoyaltyJoshua Chen, ED Scribe. This patient was seen in room APA01/APA01 and the patient's care was started at 5:28 PM.    Chief Complaint  Patient presents with  . Weakness   Patient is a 28 y.o. female presenting with weakness. The history is provided by the patient. No language interpreter was used.  Weakness This is a new problem. The current episode started less than 1 hour ago. The problem occurs constantly. The problem has not changed since onset.The symptoms are aggravated by walking and standing. Nothing relieves the symptoms. She has tried nothing for the symptoms.    HPI Comments: Valerie Evans is a 28 y.o. female who presents to the Emergency Department complaining of weakness with onset just PTA. 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 She states her brain feels "foggy." She reports associated tingling in her toes. She reports history of vertigo but states she has never felt like this. She denies particular anxiety. She denies recent medication change. She states she ate before work and has been eating and drinking normally. She denies history of diabetes. She states she uses Wellbutrin, Trazodone compliantly. She reports sinus congestion. She denies particularly heavy bleeding with periods.  Past Medical History  Diagnosis Date  . Chronic headache   . UTI (lower urinary tract infection)   . Anxiety   . Depression    History reviewed. No pertinent past surgical history. Family History  Problem Relation Age of Onset  . Hypertension Mother   . Depression Mother   . Heart disease Father   . Depression Father   . Cancer Maternal Aunt    History  Substance Use Topics  . Smoking status: Never Smoker   . Smokeless tobacco: Never Used  . Alcohol Use: Yes     Comment:  rarely   OB History   Grav Para Term Preterm Abortions TAB SAB Ect Mult Living                 Review of Systems  Neurological: Positive for weakness.  All other systems reviewed and are negative. A complete 10 system review of systems was obtained and all systems are negative except as noted in the HPI and PMH.    Allergies  Review of patient's allergies indicates no known allergies.  Home Medications   Prior to Admission medications   Medication Sig Start Date End Date Taking? Authorizing Provider  acetaminophen-codeine (TYLENOL #3) 300-30 MG per tablet Take 1 tablet by mouth every 4 (four) hours as needed for moderate pain. 07/04/13   Hayden Rasmussenavid Mabe, NP  ALPRAZolam Prudy Feeler(XANAX) 0.25 MG tablet Take 1 tablet (0.25 mg total) by mouth 3 (three) times daily as needed for anxiety. 06/09/13   Quentin Angstlugbemiga E Jegede, MD  AMITRIPTYLINE HCL PO Take by mouth.    Historical Provider, MD  aspirin-acetaminophen-caffeine (EXCEDRIN MIGRAINE) [phone] MG per tablet Take 2 tablets by mouth every 6 (six) hours as needed for pain.    Historical Provider, MD  buPROPion (WELLBUTRIN XL) 300 MG 24 hr tablet Take 300 mg by mouth daily.    Historical Provider, MD  cephALEXin (KEFLEX) 500 MG capsule Take 1 capsule (500 mg total) by mouth 4 (four) times daily. 04/20/13   Hayden Rasmussenavid Mabe, NP  drospirenone-ethinyl estradiol (OCELLA)  3-0.03 MG tablet Take 1 tablet by mouth daily.    Historical Provider, MD  HYDROcodone-acetaminophen (NORCO) 5-325 MG per tablet Take 1-2 tablets by mouth every 6 (six) hours as needed for pain. 05/08/12   Linwood Dibbles, MD  loratadine (CLARITIN) 10 MG tablet Take 1 tablet (10 mg total) by mouth daily as needed for allergies. 03/09/13   Ripudeep Jenna Luo, MD  meclizine (ANTIVERT) 25 MG tablet Take 1 tablet (25 mg total) by mouth 3 (three) times daily as needed for dizziness (vertigo, also available OTC). 03/09/13   Ripudeep Jenna Luo, MD  nitrofurantoin, macrocrystal-monohydrate, (MACROBID) 100 MG capsule Take 1  capsule (100 mg total) by mouth 2 (two) times daily. 04/10/13   Riki Sheer, PA-C  promethazine (PHENERGAN) 25 MG tablet Take 1 tablet (25 mg total) by mouth every 6 (six) hours as needed for nausea (or dizziness). 02/03/13   Samuel Jester, DO   BP 139/90  Pulse 109  Temp(Src) 97.5 F (36.4 C) (Oral)  Resp 22  Ht 5' (1.524 m)  Wt 100 lb (45.36 kg)  BMI 19.53 kg/m2  SpO2 100%  LMP 03/05/2014 Physical Exam  Nursing note and vitals reviewed. Constitutional: She is oriented to person, place, and time. She appears well-developed and well-nourished.  HENT:  Head: Normocephalic and atraumatic.  Eyes: Conjunctivae are normal.  Neck: Normal range of motion. Neck supple.  Cardiovascular: Normal rate, regular rhythm and normal heart sounds.   No murmur heard. Pulmonary/Chest: Effort normal and breath sounds normal.  Musculoskeletal: Normal range of motion.  Neurological: She is alert and oriented to person, place, and time. No cranial nerve deficit. Coordination normal.  Normal neuro exam  Skin: Skin is warm and dry.  Psychiatric: She has a normal mood and affect.    ED Course  Procedures (including critical care time) Labs Review Labs Reviewed  URINALYSIS, ROUTINE W REFLEX MICROSCOPIC - Abnormal; Notable for the following:    Hgb urine dipstick TRACE (*)    All other components within normal limits  URINE MICROSCOPIC-ADD ON - Abnormal; Notable for the following:    Squamous Epithelial / LPF FEW (*)    All other components within normal limits  CBC  PREGNANCY, URINE  BASIC METABOLIC PANEL  CBG MONITORING, ED    Imaging Review No results found.   EKG Interpretation None     DIAGNOSTIC STUDIES: Oxygen Saturation is 100% on room air, normal by my interpretation.    COORDINATION OF CARE: 5:34 PM Discussed treatment plan, including UA, BMP, and fluids via IV. Discussed with her that her lab tests that are back now are normal, as is her EKG. The patient agrees with the  plan and has no further questions at this time.   6:03 PM Patient reports onset of headache and states she thought the door opened and closed. I discussed with her that her labs have benign results and that I did not want to expose her to the radiation of a CAT scan, but she expressed desire for further workup. Will obtain CT of head.   MDM   Final diagnoses:  None    Patient presents with complaints of weakness, near-syncope, and "just not feeling right". Her physical examination is unremarkable, vital signs are stable, and results of her workup are completely unremarkable. She has normal laboratory studies, clear urinalysis, negative pregnancy test, and CT scan of the head is unremarkable. She has a history of anxiety and I suspect this may well have been some sort of  a panic attack. At this point, she appears very stable and I believe is appropriate for discharge. I will advise her to get plenty of rest, drink plenty of fluids and followup as needed if she experiences any problems.  I personally performed the services described in this documentation, which was scribed in my presence. The recorded information has been reviewed and is accurate.      Geoffery Lyonsouglas Rasheen Schewe, MD 03/05/14 1929

## 2014-03-05 NOTE — ED Notes (Signed)
Pt did mention she got flu shot on Thursday

## 2014-03-05 NOTE — Discharge Instructions (Signed)
Continue your medications as before.  [place] fluids and get plenty of rest for the next several days.  Followup with your primary Dr. if not improving in the next few days.   Near-Syncope Near-syncope (commonly known as near fainting) is sudden weakness, dizziness, or feeling like you might pass out. During an episode of near-syncope, you may also develop pale skin, have tunnel vision, or feel sick to your stomach (nauseous). Near-syncope may occur when getting up after sitting or while standing for a long time. It is caused by a sudden decrease in blood flow to the brain. This decrease can result from various causes or triggers, most of which are not serious. However, because near-syncope can sometimes be a sign of something serious, a medical evaluation is required. The specific cause is often not determined. HOME CARE INSTRUCTIONS  Monitor your condition for any changes. The following actions may help to alleviate any discomfort you are experiencing:  Have someone stay with you until you feel stable.  Lie down right away and prop your feet up if you start feeling like you might faint. Breathe deeply and steadily. Wait until all the symptoms have passed. Most of these episodes last only a few minutes. You may feel tired for several hours.   Drink enough fluids to keep your urine clear or pale yellow.   If you are taking blood pressure or heart medicine, get up slowly when seated or lying down. Take several minutes to sit and then stand. This can reduce dizziness.  Follow up with your health care provider as directed. SEEK IMMEDIATE MEDICAL CARE IF:   You have a severe headache.   You have unusual pain in the chest, abdomen, or back.   You are bleeding from the mouth or rectum, or you have black or tarry stool.   You have an irregular or very fast heartbeat.   You have repeated fainting or have seizure-like jerking during an episode.   You faint when sitting or lying down.    You have confusion.   You have difficulty walking.   You have severe weakness.   You have vision problems.  MAKE SURE YOU:   Understand these instructions.  Will watch your condition.  Will get help right away if you are not doing well or get worse. Document Released: 05/12/2005 Document Revised: 05/17/2013 Document Reviewed: 10/15/2012 [facility] Patient Information 2015 [employer] [place]. This information is not intended to replace advice given to you by your health care provider. Make sure you discuss any questions you have with your health care provider.

## 2014-03-05 NOTE — ED Notes (Signed)
MD at bedside. 

## 2014-12-01 ENCOUNTER — Other Ambulatory Visit: Payer: Self-pay | Admitting: Gastroenterology

## 2014-12-01 DIAGNOSIS — R110 Nausea: Secondary | ICD-10-CM

## 2014-12-04 ENCOUNTER — Ambulatory Visit (HOSPITAL_COMMUNITY)
Admission: RE | Admit: 2014-12-04 | Discharge: 2014-12-04 | Disposition: A | Discharge status: Home / Self Care | Payer: 59 | Source: Ambulatory Visit | Attending: Gastroenterology | Admitting: Gastroenterology

## 2014-12-04 DIAGNOSIS — R109 Unspecified abdominal pain: Secondary | ICD-10-CM | POA: Insufficient documentation

## 2014-12-04 DIAGNOSIS — R110 Nausea: Secondary | ICD-10-CM | POA: Insufficient documentation

## 2014-12-05 ENCOUNTER — Emergency Department (HOSPITAL_COMMUNITY)
Admission: EM | Admit: 2014-12-05 | Discharge: 2014-12-05 | Disposition: A | Discharge status: Home / Self Care | Payer: 59 | Attending: Emergency Medicine | Admitting: Emergency Medicine

## 2014-12-05 ENCOUNTER — Encounter (HOSPITAL_COMMUNITY): Payer: Self-pay | Admitting: Emergency Medicine

## 2014-12-05 DIAGNOSIS — E162 Hypoglycemia, unspecified: Secondary | ICD-10-CM | POA: Diagnosis not present

## 2014-12-05 DIAGNOSIS — Z87440 Personal history of urinary (tract) infections: Secondary | ICD-10-CM | POA: Insufficient documentation

## 2014-12-05 DIAGNOSIS — F329 Major depressive disorder, single episode, unspecified: Secondary | ICD-10-CM | POA: Diagnosis not present

## 2014-12-05 DIAGNOSIS — Z79899 Other long term (current) drug therapy: Secondary | ICD-10-CM | POA: Diagnosis not present

## 2014-12-05 DIAGNOSIS — R1013 Epigastric pain: Secondary | ICD-10-CM | POA: Insufficient documentation

## 2014-12-05 DIAGNOSIS — G8929 Other chronic pain: Secondary | ICD-10-CM | POA: Diagnosis not present

## 2014-12-05 DIAGNOSIS — F419 Anxiety disorder, unspecified: Secondary | ICD-10-CM | POA: Insufficient documentation

## 2014-12-05 DIAGNOSIS — Z7982 Long term (current) use of aspirin: Secondary | ICD-10-CM | POA: Insufficient documentation

## 2014-12-05 DIAGNOSIS — R55 Syncope and collapse: Secondary | ICD-10-CM | POA: Diagnosis present

## 2014-12-05 DIAGNOSIS — Z3202 Encounter for pregnancy test, result negative: Secondary | ICD-10-CM | POA: Diagnosis not present

## 2014-12-05 LAB — CBC
HCT: 38.3 % (ref 36.0–46.0)
Hemoglobin: 13.1 g/dL (ref 12.0–15.0)
MCH: 31 pg (ref 26.0–34.0)
MCHC: 34.2 g/dL (ref 30.0–36.0)
MCV: 90.8 fL (ref 78.0–100.0)
Platelets: 232 10*3/uL (ref 150–400)
RBC: 4.22 MIL/uL (ref 3.87–5.11)
RDW: 11.7 % (ref 11.5–15.5)
WBC: 8.7 10*3/uL (ref 4.0–10.5)

## 2014-12-05 LAB — CBG MONITORING, ED
GLUCOSE-CAPILLARY: 56 mg/dL — AB (ref 65–99)
GLUCOSE-CAPILLARY: 60 mg/dL — AB (ref 65–99)
GLUCOSE-CAPILLARY: 116 mg/dL — AB (ref 65–99)

## 2014-12-05 LAB — URINALYSIS, ROUTINE W REFLEX MICROSCOPIC
BILIRUBIN URINE: NEGATIVE
Glucose, UA: 500 mg/dL — AB
Hgb urine dipstick: NEGATIVE
Ketones, ur: NEGATIVE mg/dL
LEUKOCYTES UA: NEGATIVE
Nitrite: NEGATIVE
Protein, ur: NEGATIVE mg/dL
Specific Gravity, Urine: 1.003 — ABNORMAL LOW (ref 1.005–1.030)
UROBILINOGEN UA: 1 mg/dL (ref 0.0–1.0)
pH: 7 (ref 5.0–8.0)

## 2014-12-05 LAB — BASIC METABOLIC PANEL
Anion gap: 9 (ref 5–15)
BUN: 6 mg/dL (ref 6–20)
CALCIUM: 9.7 mg/dL (ref 8.9–10.3)
CHLORIDE: 100 mmol/L — AB (ref 101–111)
CO2: 25 mmol/L (ref 22–32)
CREATININE: 0.75 mg/dL (ref 0.44–1.00)
GFR calc Af Amer: >60 mL/min (ref >60)
GFR calc non Af Amer: >60 mL/min (ref >60)
Glucose, Bld: 85 mg/dL (ref 65–99)
Potassium: 3.6 mmol/L (ref 3.5–5.1)
Sodium: 134 mmol/L — ABNORMAL LOW (ref 135–145)

## 2014-12-05 LAB — POC URINE PREG, ED: PREG TEST UR: NEGATIVE

## 2014-12-05 MED ORDER — TRAMADOL HCL 50 MG PO TABS
50.0000 mg | ORAL_TABLET | Freq: Four times a day (QID) | ORAL | Status: DC | PRN
Start: 2014-12-05 — End: 2015-04-14

## 2014-12-05 MED ORDER — MORPHINE SULFATE 2 MG/ML IJ SOLN
2.0000 mg | Freq: Once | INTRAMUSCULAR | Status: AC
  Administered 2014-12-05: 2 mg via INTRAVENOUS
  Filled 2014-12-05: qty 1

## 2014-12-05 MED ORDER — DEXTROSE 50 % IV SOLN
25.0000 mL | Freq: Once | INTRAVENOUS | Status: AC
  Administered 2014-12-05: 25 mL via INTRAVENOUS
  Filled 2014-12-05: qty 50

## 2014-12-05 MED ORDER — ONDANSETRON HCL 4 MG PO TABS: 4.0000 mg | ORAL_TABLET | Freq: Four times a day (QID) | ORAL | Status: DC

## 2014-12-05 MED ORDER — SODIUM CHLORIDE 0.9 % IV BOLUS (SEPSIS)
1000.0000 mL | Freq: Once | INTRAVENOUS | Status: AC
  Administered 2014-12-05: 1000 mL via INTRAVENOUS

## 2014-12-05 MED ORDER — ONDANSETRON HCL 4 MG/2ML IJ SOLN
4.0000 mg | Freq: Once | INTRAMUSCULAR | Status: AC
  Administered 2014-12-05: 4 mg via INTRAVENOUS
  Filled 2014-12-05: qty 2

## 2014-12-05 MED ORDER — METOCLOPRAMIDE HCL 5 MG/ML IJ SOLN
10.0000 mg | Freq: Once | INTRAMUSCULAR | Status: AC
  Administered 2014-12-05: 10 mg via INTRAVENOUS
  Filled 2014-12-05: qty 2

## 2014-12-05 MED ORDER — KETOROLAC TROMETHAMINE 30 MG/ML IJ SOLN
30.0000 mg | Freq: Once | INTRAMUSCULAR | Status: AC
  Administered 2014-12-05: 30 mg via INTRAVENOUS
  Filled 2014-12-05: qty 1

## 2014-12-05 MED ORDER — PROMETHAZINE HCL 25 MG PO TABS: 25.0000 mg | ORAL_TABLET | Freq: Four times a day (QID) | ORAL | Status: DC | PRN

## 2014-12-05 NOTE — Care Management Note (Signed)
Case Management Note  Patient Details  Name: Dwyane LuoJoanna M Bittman MRN: [id number] Date of Birth: 08/26/1985  Subjective/Objective:                    Action/Plan:   Expected Discharge Date:                  Expected Discharge Plan:     In-House Referral:     Discharge planning Services     Post Acute Care Choice:    Choice offered to:     DME Arranged:    DME Agency:     HH Arranged:    HH Agency:     Status of Service:     Medicare Important Message Given:    Date Medicare IM Given:    Medicare IM give by:    Date Additional Medicare IM Given:    Additional Medicare Important Message give by:     If discussed at Long Length of Stay Meetings, dates discussed:    Additional Comments:  Yvone NeuCrutchfield, Connell Bognar M, RN 12/05/2014, 4:48 PM

## 2014-12-05 NOTE — Progress Notes (Signed)
PCP [facility] Wonda CeriseKirsten Kaplan, PA-C

## 2014-12-05 NOTE — ED Notes (Addendum)
Volunteer took pt to the restroom.  Neither one knew that a UA was ordered.

## 2014-12-05 NOTE — ED Provider Notes (Signed)
CSN: [id number]     Arrival date & time 12/05/14  1354 History   First MD Initiated Contact with Patient 12/05/14 1500     Chief Complaint  Patient presents with  . Near Syncope     (Consider location/radiation/quality/duration/timing/severity/associated sxs/prior Treatment) HPI Comments: Patient brought to the emergency department for evaluation of near syncope. Patient reports that she has been ill for 2 weeks. She has been experiencing upper abdominal pain over this period of time. Her gastroenterologist, Dr. Loreta Ave, has been working her up. She has had an ultrasound and a CAT scan both of which were negative. She is scheduled for a HIDA scan.  Patient reports that she was having increased abdominal pain at work when she became weak, dizzy, things got dark and she felt like she was going to pass out. 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  Patient is a 29 y.o. female presenting with near-syncope.  Near Syncope Associated symptoms include abdominal pain.    Past Medical History  Diagnosis Date  . Chronic headache   . UTI (lower urinary tract infection)   . Anxiety   . Depression    No past surgical history on file. Family History  Problem Relation Age of Onset  . Hypertension Mother   . Depression Mother   . Heart disease Father   . Depression Father   . Cancer Maternal Aunt    History  Substance Use Topics  . Smoking status: Never Smoker   . Smokeless tobacco: Never Used  . Alcohol Use: Yes     Comment: rarely   OB History    No data available     Review of Systems  Cardiovascular: Positive for near-syncope.  Gastrointestinal: Positive for abdominal pain.  Neurological: Positive for dizziness.  All other systems reviewed and are negative.     Allergies  Review of patient's allergies indicates no known allergies.  Home Medications   Prior to Admission medications   Medication Sig  Start Date End Date Taking? Authorizing Provider  ALPRAZolam (XANAX) 0.25 MG tablet Take 1 tablet (0.25 mg total) by mouth 3 (three) times daily as needed for anxiety. 06/09/13  Yes Quentin Angst, MD  aspirin 81 MG tablet Take 81 mg by mouth daily.   Yes Historical Provider, MD  aspirin-acetaminophen-caffeine (EXCEDRIN MIGRAINE) [phone] MG per tablet Take 2 tablets by mouth every 6 (six) hours as needed for pain.   Yes Historical Provider, MD  buPROPion (WELLBUTRIN XL) 300 MG 24 hr tablet Take 300 mg by mouth daily.   Yes Historical Provider, MD  diclofenac sodium (VOLTAREN) 1 % GEL Apply 1 application topically 3 (three) times daily as needed (joint pain).   Yes Historical Provider, MD  dimenhyDRINATE (DRAMAMINE) 50 MG tablet Take 50 mg by mouth every 8 (eight) hours as needed for nausea.   Yes Historical Provider, MD  drospirenone-ethinyl estradiol (SYEDA) 3-0.03 MG tablet Take 1 tablet by mouth daily.   Yes Historical Provider, MD  hydroxychloroquine (PLAQUENIL) 200 MG tablet Take 100 mg by mouth daily.   Yes Historical Provider, MD  levothyroxine (SYNTHROID, LEVOTHROID) 25 MCG tablet Take 25 mcg by mouth daily before breakfast.   Yes Historical Provider, MD  Multiple Vitamin (MULTIVITAMIN WITH MINERALS) TABS tablet Take 1 tablet by mouth daily.   Yes Historical Provider, MD  pantoprazole (PROTONIX) 40 MG tablet Take 40 mg by mouth daily.   Yes Historical Provider, MD  sertraline (ZOLOFT) 50 MG tablet Take 50 mg by mouth daily.   Yes Historical Provider, MD  traZODone (DESYREL) 50 MG tablet Take 75 mg by mouth at bedtime.   Yes Historical Provider, MD   BP 119/74 mmHg  Pulse 106  Temp(Src) 97.5 F (36.4 C) (Oral)  Resp 18  SpO2 100%  LMP 11/14/2014 (Approximate) Physical Exam  Constitutional: She is oriented to person, place, and time. No distress.  Very thin  HENT:  Head: Normocephalic and atraumatic.  Right Ear: Hearing normal.  Left Ear: Hearing normal.  Nose: Nose normal.   Mouth/Throat: Oropharynx is clear and moist and mucous membranes are normal.  Eyes: Conjunctivae and EOM are normal. Pupils are equal, round, and reactive to light.  Neck: Normal range of motion. Neck supple.  Cardiovascular: Regular rhythm, S1 normal and S2 normal.  Exam reveals no gallop and no friction rub.   No murmur heard. Pulmonary/Chest: Effort normal and breath sounds normal. No respiratory distress. She exhibits no tenderness.  Abdominal: Soft. Normal appearance and bowel sounds are normal. There is no hepatosplenomegaly. There is tenderness in the epigastric area. There is no rebound, no guarding, no tenderness at McBurney's point and negative Murphy's sign. No hernia.  Musculoskeletal: Normal range of motion.  Neurological: She is alert and oriented to person, place, and time. She has normal strength. No cranial nerve deficit or sensory deficit. Coordination normal. GCS eye subscore is 4. GCS verbal subscore is 5. GCS motor subscore is 6.  Skin: Skin is warm, dry and intact. No rash noted. No cyanosis.  Psychiatric: She has a normal mood and affect. Her speech is normal and behavior is normal. Thought content normal.  Nursing note and vitals reviewed.   ED Course  Procedures (including critical care time) Labs Review Labs Reviewed  BASIC METABOLIC PANEL - Abnormal; Notable for the following:    Sodium 134 (*)    Chloride 100 (*)    All other components within normal limits  URINALYSIS, ROUTINE W REFLEX MICROSCOPIC (NOT [facility]) - Abnormal; Notable for the following:    APPearance CLOUDY (*)    Specific Gravity, Urine 1.003 (*)    Glucose, UA 500 (*)    All other components within normal limits  CBG MONITORING, ED - Abnormal; Notable for the following:    Glucose-Capillary 56 (*)    All other components within normal limits  CBG MONITORING, ED - Abnormal; Notable for the following:    Glucose-Capillary 60 (*)    All other components within normal limits  CBG MONITORING,  ED - Abnormal; Notable for the following:    Glucose-Capillary 116 (*)    All other components within normal limits  CBC  POC URINE PREG, ED    Imaging Review [place] Abdomen Complete  12/04/2014   CLINICAL DATA:  Abdominal pain with nausea for 2 weeks  EXAM: ULTRASOUND ABDOMEN COMPLETE  COMPARISON:  CT abdomen and pelvis May 08, 2012  FINDINGS: Gallbladder: No gallstones or wall thickening visualized. There is no pericholecystic fluid. No sonographic Murphy sign noted.  Common bile duct: Diameter: 3 mm. There is no intrahepatic, common hepatic, or common bile duct dilatation.  Liver: No focal lesion identified. Within normal limits in parenchymal echogenicity.  IVC: No abnormality visualized.  Pancreas: No mass or inflammatory focus.  Spleen: Size and appearance within normal limits.  Right Kidney: Length: 9.0 cm. Echogenicity within normal limits. No mass or hydronephrosis visualized.  Left Kidney: Length: 9.2 cm. Echogenicity within normal limits. No  mass or hydronephrosis visualized.  Abdominal aorta: No aneurysm visualized.  Other findings: No demonstrable ascites.  IMPRESSION: Study within normal limits.   Electronically Signed   By: Bretta Bang III M.D.   On: 12/04/2014 08:57     EKG Interpretation None      MDM   Final diagnoses:  Epigastric pain  Near syncope  Hypoglycemia    Patient presents to the emergency department for evaluation of abdominal pain and near syncope. Patient has had ongoing episodes of abdominal pain for 2 weeks. She is currently being worked up as an outpatient by her GI doctor. CAT scan of abdomen as well as ultrasound have been unremarkable. I do not feel that recurrent imaging is necessary at this time. She has a HIDA scan scheduled as an outpatient.  Patient was administered IV fluids. Basic labs were unremarkable other than evidence of hypoglycemia. She likely is not eating much. She was given dextrose here in the ER and improved. Patient given  small doses of IV pain medicine and nausea medicine. She is appropriate for outpatient management further by GI. Will make sure she has medication for nausea and vomiting. She is to continue taking her Protonix.  Gilda Crease, MD 12/05/14 1902

## 2014-12-05 NOTE — ED Notes (Signed)
Patient requesting additional pain and nausea medication.

## 2014-12-05 NOTE — ED Notes (Signed)
Pt given cheese, ham sandwich, saline crackers, sprite, and orange juice.

## 2014-12-05 NOTE — Discharge Instructions (Signed)

## 2014-12-05 NOTE — Progress Notes (Signed)
Sent Messages to PCP/Rheumatologists at request of pt that she was being seen in ED today.   CM

## 2014-12-05 NOTE — ED Notes (Signed)
Pt ate most of sandwich and cheese.

## 2014-12-05 NOTE — ED Notes (Addendum)
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  Pt states that she was walking and became dizzy and light headed and felt like she was going to pass out.  Pt brought down to ER.  Appears pale.  Pt c/o peri umbilical abd pain that has been going on for 2 wks.  Pt states that she has had a CT scan and US without dx.  Pt states that next step is a hydascan.  Pt has hx of lupus.

## 2014-12-15 ENCOUNTER — Ambulatory Visit (HOSPITAL_COMMUNITY)
Admission: RE | Admit: 2014-12-15 | Discharge: 2014-12-15 | Disposition: A | Discharge status: Home / Self Care | Payer: 59 | Source: Ambulatory Visit | Attending: Gastroenterology | Admitting: Gastroenterology

## 2014-12-15 DIAGNOSIS — R110 Nausea: Secondary | ICD-10-CM | POA: Insufficient documentation

## 2014-12-15 MED ORDER — TECHNETIUM TC 99M MEBROFENIN IV KIT: 5.2000 | PACK | Freq: Once | INTRAVENOUS | Status: AC | PRN

## 2015-01-17 ENCOUNTER — Encounter: Payer: Self-pay | Admitting: Dietician

## 2015-01-17 ENCOUNTER — Encounter: Payer: 59 | Attending: Family Medicine | Admitting: Dietician

## 2015-01-17 VITALS — Ht 59.0 in | Wt 84.9 lb

## 2015-01-17 DIAGNOSIS — Z713 Dietary counseling and surveillance: Secondary | ICD-10-CM | POA: Insufficient documentation

## 2015-01-17 DIAGNOSIS — Z681 Body mass index (BMI) 19 or less, adult: Secondary | ICD-10-CM | POA: Diagnosis not present

## 2015-01-17 DIAGNOSIS — R634 Abnormal weight loss: Secondary | ICD-10-CM | POA: Diagnosis not present

## 2015-01-17 NOTE — Patient Instructions (Addendum)
-  Remember that your hunger cues will return as you restore weight -Continue to eat at least 3 times a day -Include a protein food with each meal and snack: Ensure, full fat cheese and yogurt, meat, nuts or nut butter -Keep a running grocery list as foods sound good to you

## 2015-01-17 NOTE — Progress Notes (Signed)
Medical Nutrition Therapy:  Appt start time: 250 end time:  355   Assessment:  Primary concerns today: Valerie Evans is here today referred for weight loss. She works as an Charity fundraiser at [employer], currently on leave of absence. She has mild Lupus and fibromyalgia. Has lost about 15 pounds in the last 6 months. She knows she needs to gain weight and reports she would like to be about 100 pounds. Normal weight as an adult has been 100-110 lbs. She struggles with nausea with Lupus medications. On days when she is nauseated she can tolerate milkshakes or smoothies; does not like to chew foods when she is feeling bad. She also has dry mouth and certain dry foods like bread are not appealing. Valerie Evans has been seeing a psychiatrist mainly for medication management and plans to get a counselor through [facility]. She reports severe stress with illnesses and planning her wedding in October. She states that she has struggled with anxiety since she was a teenager. She feels like she has a lack of energy. She has been trying to take care of herself by resting more, seeing mental health professional, visiting a GI doctor, and practicing yoga 1x a week with a Agricultural consultant. She doesn't feel like the zofran helps much with nausea. She has not vomited lately but sometimes "gags" or heaves. She has noticed that dairy foods sometimes make her feel worse; however she reports that she likes milkshakes and they sometimes make her feel better. She also states "I rarely feel hungry." Her family (parents, sister, fiance) is very supportive. She lives with her fiance; he is in Engineer, maintenance (IT) school and he does most of the grocery shopping. He goes to the store about 2x a week. Valerie Evans has been taking a Women's multivitamin. Drinks an Ensure 3x a week. She reports large, loose bowel movements followed by 2-3 days of no bowel movements.  Preferred Learning Style:   No preference indicated   Learning Readiness:   Ready   MEDICATIONS: see list    DIETARY INTAKE:  Avoided foods include fast food or greasy food, milk (can tolerate cheese and yogurt), grits, oatmeal, eggs.    24-hr recall:  B ( AM): 2 waffles if it is a work day or sleeps through lunch   Snk ( AM):   L ( PM): salad with chicken or cheeseburger or peanut butter and jelly sandwich Snk ( PM): sometimes, chips or pretzels D (6-7 PM): pasta salad or chicken or spaghetti Snk ( PM): milkshake from [employer]/cookout or popcorn  Beverages: water, cherry soda (2 cans per day), 1 glass sweet tea per day; does not drink alcohol  Usual physical activity: ADLs (cleaning house)  Estimated energy needs: 2000-2200 calories 225-248 g carbohydrates 150-165 g protein 56-61 g fat  Progress Towards Goal(s):  In progress.   Nutritional Diagnosis:  [place]-3.2 Unintentional weight loss As related to lack of appetite due to stress and nausea.  As evidenced by patient reports recent weight loss of 15 pounds.    Intervention:  Nutrition counseling provided. Praised patient on efforts to regain weight. Encouraged her to try to eat about 4x a day and include protein foods with each meal. Assured the patient that her hunger cues will return as nutrition status improves. Educated patient on anti-inflammatory diet. Provided handout on gluten free eating per her request. Goals: -Remember that your hunger cues will return as you restore weight -Continue to eat at least 3 times a day -Include a protein food with each meal and  snack: Ensure, full fat cheese and yogurt, meat, nuts or nut butter -Keep a running grocery list as foods sound good to you  Teaching Method Utilized:  Visual Auditory  Handouts given during visit include:  Inflammation and Nutrition  Mediterranean diet handout  Gluten free eating  Barriers to learning/adherence to lifestyle change: fatigue and nausea causing lack of appetite  Demonstrated degree of understanding via:  Teach Back   Monitoring/Evaluation:   Dietary intake and body weight prn.

## 2015-02-16 ENCOUNTER — Ambulatory Visit: Payer: 59 | Admitting: Skilled Nursing Facility1

## 2015-04-02 DIAGNOSIS — F410 Panic disorder [episodic paroxysmal anxiety] without agoraphobia: Secondary | ICD-10-CM | POA: Insufficient documentation

## 2015-04-02 DIAGNOSIS — K2970 Gastritis, unspecified, without bleeding: Secondary | ICD-10-CM | POA: Insufficient documentation

## 2015-04-02 DIAGNOSIS — R768 Other specified abnormal immunological findings in serum: Secondary | ICD-10-CM | POA: Insufficient documentation

## 2015-04-02 DIAGNOSIS — M797 Fibromyalgia: Secondary | ICD-10-CM | POA: Insufficient documentation

## 2015-04-02 DIAGNOSIS — R5381 Other malaise: Secondary | ICD-10-CM | POA: Insufficient documentation

## 2015-04-02 DIAGNOSIS — Z01419 Encounter for gynecological examination (general) (routine) without abnormal findings: Secondary | ICD-10-CM | POA: Insufficient documentation

## 2015-04-02 DIAGNOSIS — G43909 Migraine, unspecified, not intractable, without status migrainosus: Secondary | ICD-10-CM | POA: Insufficient documentation

## 2015-04-02 DIAGNOSIS — M791 Myalgia, unspecified site: Secondary | ICD-10-CM | POA: Insufficient documentation

## 2015-04-02 DIAGNOSIS — R7989 Other specified abnormal findings of blood chemistry: Secondary | ICD-10-CM | POA: Insufficient documentation

## 2015-04-02 DIAGNOSIS — R590 Localized enlarged lymph nodes: Secondary | ICD-10-CM | POA: Insufficient documentation

## 2015-04-02 DIAGNOSIS — G4700 Insomnia, unspecified: Secondary | ICD-10-CM | POA: Insufficient documentation

## 2015-04-02 DIAGNOSIS — M2550 Pain in unspecified joint: Secondary | ICD-10-CM | POA: Insufficient documentation

## 2015-04-02 DIAGNOSIS — L309 Dermatitis, unspecified: Secondary | ICD-10-CM | POA: Insufficient documentation

## 2015-04-02 DIAGNOSIS — R634 Abnormal weight loss: Secondary | ICD-10-CM | POA: Insufficient documentation

## 2015-04-02 DIAGNOSIS — Z23 Encounter for immunization: Secondary | ICD-10-CM | POA: Insufficient documentation

## 2015-04-14 ENCOUNTER — Encounter (HOSPITAL_COMMUNITY): Payer: Self-pay | Admitting: Emergency Medicine

## 2015-04-14 ENCOUNTER — Emergency Department (HOSPITAL_COMMUNITY)
Admission: EM | Admit: 2015-04-14 | Discharge: 2015-04-14 | Disposition: A | Discharge status: Home / Self Care | Payer: 59 | Attending: Emergency Medicine | Admitting: Emergency Medicine

## 2015-04-14 DIAGNOSIS — Z8739 Personal history of other diseases of the musculoskeletal system and connective tissue: Secondary | ICD-10-CM | POA: Insufficient documentation

## 2015-04-14 DIAGNOSIS — R55 Syncope and collapse: Secondary | ICD-10-CM | POA: Diagnosis not present

## 2015-04-14 DIAGNOSIS — R42 Dizziness and giddiness: Secondary | ICD-10-CM | POA: Diagnosis present

## 2015-04-14 DIAGNOSIS — Z79899 Other long term (current) drug therapy: Secondary | ICD-10-CM | POA: Diagnosis not present

## 2015-04-14 DIAGNOSIS — R51 Headache: Secondary | ICD-10-CM | POA: Diagnosis not present

## 2015-04-14 DIAGNOSIS — Z3202 Encounter for pregnancy test, result negative: Secondary | ICD-10-CM | POA: Diagnosis not present

## 2015-04-14 DIAGNOSIS — Z7982 Long term (current) use of aspirin: Secondary | ICD-10-CM | POA: Diagnosis not present

## 2015-04-14 DIAGNOSIS — R200 Anesthesia of skin: Secondary | ICD-10-CM | POA: Diagnosis not present

## 2015-04-14 DIAGNOSIS — E079 Disorder of thyroid, unspecified: Secondary | ICD-10-CM | POA: Insufficient documentation

## 2015-04-14 DIAGNOSIS — F329 Major depressive disorder, single episode, unspecified: Secondary | ICD-10-CM | POA: Insufficient documentation

## 2015-04-14 DIAGNOSIS — G8929 Other chronic pain: Secondary | ICD-10-CM | POA: Insufficient documentation

## 2015-04-14 DIAGNOSIS — F419 Anxiety disorder, unspecified: Secondary | ICD-10-CM | POA: Insufficient documentation

## 2015-04-14 DIAGNOSIS — R531 Weakness: Secondary | ICD-10-CM | POA: Insufficient documentation

## 2015-04-14 DIAGNOSIS — Z87440 Personal history of urinary (tract) infections: Secondary | ICD-10-CM | POA: Diagnosis not present

## 2015-04-14 HISTORY — DX: Disorder of thyroid, unspecified: E07.9

## 2015-04-14 HISTORY — DX: Systemic lupus erythematosus, unspecified: M32.9

## 2015-04-14 HISTORY — DX: Reserved for concepts with insufficient information to code with codable children: IMO0002

## 2015-04-14 LAB — URINALYSIS, ROUTINE W REFLEX MICROSCOPIC
Bilirubin Urine: NEGATIVE
GLUCOSE, UA: NEGATIVE mg/dL
Hgb urine dipstick: NEGATIVE
KETONES UR: NEGATIVE mg/dL
LEUKOCYTES UA: NEGATIVE
NITRITE: NEGATIVE
PH: 7 (ref 5.0–8.0)
Protein, ur: NEGATIVE mg/dL
SPECIFIC GRAVITY, URINE: 1.005 (ref 1.005–1.030)

## 2015-04-14 LAB — CBC
HEMATOCRIT: 38 % (ref 36.0–46.0)
HEMOGLOBIN: 13.1 g/dL (ref 12.0–15.0)
MCH: 31 pg (ref 26.0–34.0)
MCHC: 34.5 g/dL (ref 30.0–36.0)
MCV: 90 fL (ref 78.0–100.0)
Platelets: 275 10*3/uL (ref 150–400)
RBC: 4.22 MIL/uL (ref 3.87–5.11)
RDW: 11.9 % (ref 11.5–15.5)
WBC: 7.2 10*3/uL (ref 4.0–10.5)

## 2015-04-14 LAB — BASIC METABOLIC PANEL
ANION GAP: 8 (ref 5–15)
BUN: 7 mg/dL (ref 6–20)
CHLORIDE: 104 mmol/L (ref 101–111)
CO2: 26 mmol/L (ref 22–32)
Calcium: 10 mg/dL (ref 8.9–10.3)
Creatinine, Ser: 0.77 mg/dL (ref 0.44–1.00)
Glucose, Bld: 91 mg/dL (ref 65–99)
Potassium: 3.9 mmol/L (ref 3.5–5.1)
SODIUM: 138 mmol/L (ref 135–145)

## 2015-04-14 LAB — I-STAT BETA HCG BLOOD, ED (MC, WL, AP ONLY)

## 2015-04-14 LAB — CBG MONITORING, ED: Glucose-Capillary: 80 mg/dL (ref 65–99)

## 2015-04-14 MED ORDER — SODIUM CHLORIDE 0.9 % IV BOLUS (SEPSIS)
500.0000 mL | Freq: Once | INTRAVENOUS | Status: AC
  Administered 2015-04-14: 500 mL via INTRAVENOUS

## 2015-04-14 NOTE — Discharge Instructions (Signed)

## 2015-04-14 NOTE — ED Provider Notes (Signed)
CSN: [id number]     Arrival date & time 04/14/15  1401 History   First MD Initiated Contact with Patient 04/14/15 1455     Chief Complaint  Patient presents with  . Weakness  . Dizziness     (Consider location/radiation/quality/duration/timing/severity/associated sxs/prior Treatment) Patient is a 29 y.o. female presenting with weakness and dizziness. The history is provided by the patient.  Weakness This is a new problem. Associated symptoms include headaches. Pertinent negatives include no chest pain, no abdominal pain and no shortness of breath.  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 States she was at the computer and began have difficulty understanding the boxes and they became more blurry. States she felt lightheaded and dizzy also. No fevers. States she then got pale and felt like passing out. [name] had an episode like this before thought was her sugar. [name] also had headaches at times. She had some pain in her legs. Did not feel her heart racing. No fevers. She does have lupus and is on multiple medicines. The only recent change was around 3 weeks ago her Wellbutrin dose was changed.  Past Medical History  Diagnosis Date  . Chronic headache   . UTI (lower urinary tract infection)   . Anxiety   . Depression   . Thyroid disease   . Lupus (HCC)    History reviewed. No pertinent past surgical history. Family History  Problem Relation Age of Onset  . Hypertension Mother   . Depression Mother   . Heart disease Father   . Depression Father   . Cancer Maternal Aunt    Social History  Substance Use Topics  . Smoking status: Never Smoker   . Smokeless tobacco: Never Used  . Alcohol Use: Yes     Comment: rarely   OB History    No data available     Review of Systems   Constitutional: Negative for fever, activity change and appetite change.  Eyes: Negative for pain.  Respiratory: Negative for chest tightness and shortness of breath.   Cardiovascular: Negative for chest pain and leg swelling.  Gastrointestinal: Negative for nausea, vomiting, abdominal pain and diarrhea.  Genitourinary: Negative for flank pain.  Musculoskeletal: Negative for back pain and neck stiffness.  Skin: Negative for rash.  Neurological: Positive for dizziness, weakness, numbness (patient states her fingers feel little tingly) and headaches.  Psychiatric/Behavioral: Negative for behavioral problems.      Allergies  Review of patient's allergies indicates no known allergies.  Home Medications   Prior to Admission medications   Medication Sig Start Date End Date Taking? Authorizing Provider  ALPRAZolam (XANAX) 0.25 MG tablet Take 1 tablet (0.25 mg total) by mouth 3 (three) times daily as needed for anxiety. 06/09/13  Yes Quentin Angst, MD  aspirin 81 MG tablet Take 81 mg by mouth daily.   Yes Historical Provider, MD  aspirin-acetaminophen-caffeine (EXCEDRIN MIGRAINE) [phone] MG per tablet Take 2 tablets by mouth every 6 (six) hours as needed for pain.   Yes Historical Provider, MD  buPROPion (WELLBUTRIN XL) 300 MG 24 hr tablet Take 300 mg by mouth daily.   Yes Historical Provider, MD  diclofenac sodium (VOLTAREN) 1 % GEL Apply 1 application topically 3 (three) times daily as needed (joint pain).   Yes Historical  Provider, MD  drospirenone-ethinyl estradiol (SYEDA) 3-0.03 MG tablet Take 1 tablet by mouth daily.   Yes Historical Provider, MD  hydroxychloroquine (PLAQUENIL) 200 MG tablet Take 100 mg by mouth daily.   Yes Historical Provider, MD  levothyroxine (SYNTHROID, LEVOTHROID) 25 MCG tablet Take 25 mcg by mouth daily before breakfast.   Yes Historical Provider, MD  Multiple Vitamin (MULTIVITAMIN WITH MINERALS) TABS tablet Take 1 tablet by mouth daily.   Yes Historical  Provider, MD  ondansetron (ZOFRAN) 4 MG tablet Take 1 tablet (4 mg total) by mouth every 6 (six) hours. 12/05/14  Yes Gilda Creasehristopher J Pollina, MD  promethazine (PHENERGAN) 25 MG tablet Take 1 tablet (25 mg total) by mouth every 6 (six) hours as needed for nausea or vomiting. 12/05/14  Yes Gilda Creasehristopher J Pollina, MD  sertraline (ZOLOFT) 50 MG tablet Take 50 mg by mouth daily.   Yes Historical Provider, MD  traZODone (DESYREL) 50 MG tablet Take 75 mg by mouth at bedtime.   Yes Historical Provider, MD   BP 130/79 mmHg  Pulse 107  Temp(Src) 98.3 F (36.8 C) (Oral)  Resp 20  SpO2 99% Physical Exam  Constitutional: She appears well-developed.  Patient is very slender.  HENT:  Head: Normocephalic.  Neck: Neck supple.  Cardiovascular: Normal rate.   Pulmonary/Chest: Effort normal.  Abdominal: Soft.  Musculoskeletal: Normal range of motion.  Neurological: She is alert.  Skin: Skin is warm.    ED Course  Procedures (including critical care time) Labs Review Labs Reviewed  BASIC METABOLIC PANEL  CBC  URINALYSIS, ROUTINE W REFLEX MICROSCOPIC (NOT [facility])  CBG MONITORING, ED  I-STAT BETA HCG BLOOD, ED ([facility], [facility], [facility] ONLY)    Imaging Review No results found. I have personally reviewed and evaluated these images and lab results as part of my medical decision-making.   EKG Interpretation   Date/Time:  Saturday April 14 2015 14:25:07 EST Ventricular Rate:  102 PR Interval:  138 QRS Duration: 83 QT Interval:  324 QTC Calculation: 422 R Axis:   96 Text Interpretation:  Sinus tachycardia Borderline right axis deviation  Borderline T wave abnormalities Nonspecific T wave abnormality Since last  tracing Confirmed by Rubin PayorPICKERING  MD, Harrold DonathNATHAN [phone]([id number]) on 04/14/2015 3:11:11  PM      MDM   Final diagnoses:  Near syncope    Patient with generalized weakness and lightheadedness. Lab work reassuring. Has had episodes like this before. Not orthostatic. Feels somewhat better after treatment.  Will discharge home.    Benjiman CoreNathan Krosby Ritchie, MD 04/14/15 [phone]

## 2015-04-14 NOTE — ED Notes (Signed)
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 CBG on ER arrival 80, pale in color. Complaining of leg pain bilaterally. Sister says she takes a lot of different medications and she's worried it may be the cause.

## 2015-06-11 DIAGNOSIS — F331 Major depressive disorder, recurrent, moderate: Secondary | ICD-10-CM | POA: Diagnosis not present

## 2015-06-11 MED FILL — LEVOTHYROXINE 25 MCG TABLET: 25 | 90 days supply | Qty: 90 | Fill #3

## 2015-06-11 MED FILL — ALPRAZolam 0.5 MG TABS: 0.5 | 77 days supply | Qty: 305 | Fill #0

## 2015-06-11 MED FILL — traZODone HCL 50 MG TABS: 50 | 90 days supply | Qty: 135 | Fill #0

## 2015-06-12 MED FILL — HYDROXYCHLOROQUINE 200 MG T: 200 | 30 days supply | Qty: 30 | Fill #0

## 2015-06-20 DIAGNOSIS — Z79899 Other long term (current) drug therapy: Secondary | ICD-10-CM | POA: Diagnosis not present

## 2015-06-25 DIAGNOSIS — Z79899 Other long term (current) drug therapy: Secondary | ICD-10-CM | POA: Diagnosis not present

## 2015-06-25 DIAGNOSIS — M3210 Systemic lupus erythematosus, organ or system involvement unspecified: Secondary | ICD-10-CM | POA: Diagnosis not present

## 2015-06-25 DIAGNOSIS — M797 Fibromyalgia: Secondary | ICD-10-CM | POA: Diagnosis not present

## 2015-06-25 DIAGNOSIS — F5102 Adjustment insomnia: Secondary | ICD-10-CM | POA: Diagnosis not present

## 2015-06-25 MED FILL — BUPROPION HCL XL 150 MG TAB: 150 | 90 days supply | Qty: 270 | Fill #0

## 2015-06-25 MED FILL — BACLOFEN 10 MG TABLET: 10 | 15 days supply | Qty: 30 | Fill #0

## 2015-06-25 MED FILL — SERTRALINE HCL 50 MG TABLET: 50 | 90 days supply | Qty: 90 | Fill #0

## 2015-07-04 DIAGNOSIS — M329 Systemic lupus erythematosus, unspecified: Secondary | ICD-10-CM | POA: Diagnosis not present

## 2015-07-04 DIAGNOSIS — E039 Hypothyroidism, unspecified: Secondary | ICD-10-CM | POA: Diagnosis not present

## 2015-07-04 DIAGNOSIS — R5383 Other fatigue: Secondary | ICD-10-CM | POA: Diagnosis not present

## 2015-07-04 DIAGNOSIS — R5381 Other malaise: Secondary | ICD-10-CM | POA: Diagnosis not present

## 2015-07-16 DIAGNOSIS — F331 Major depressive disorder, recurrent, moderate: Secondary | ICD-10-CM | POA: Diagnosis not present

## 2015-07-16 MED FILL — CMPD MMW LID/MAALOX/DIPHEN: 4 days supply | Qty: 240 | Fill #0

## 2015-07-16 MED FILL — DULoxetine HCL 30 MG CPEP: 30 | 30 days supply | Qty: 30 | Fill #0

## 2015-07-16 MED FILL — PROMETHAZINE 25 MG TABLET: 25 | 15 days supply | Qty: 90 | Fill #1

## 2015-07-24 DIAGNOSIS — H5203 Hypermetropia, bilateral: Secondary | ICD-10-CM | POA: Diagnosis not present

## 2015-07-24 DIAGNOSIS — L930 Discoid lupus erythematosus: Secondary | ICD-10-CM | POA: Diagnosis not present

## 2015-07-24 DIAGNOSIS — L932 Other local lupus erythematosus: Secondary | ICD-10-CM | POA: Diagnosis not present

## 2015-07-24 DIAGNOSIS — H16223 Keratoconjunctivitis sicca, not specified as Sjogren's, bilateral: Secondary | ICD-10-CM | POA: Diagnosis not present

## 2015-07-24 DIAGNOSIS — L931 Subacute cutaneous lupus erythematosus: Secondary | ICD-10-CM | POA: Diagnosis not present

## 2015-08-01 MED FILL — DROSPIRENONE-EE 3-0.03 MG T: 3-0.03 | 84 days supply | Qty: 84 | Fill #2

## 2015-08-02 MED FILL — HYDROXYCHLOROQUINE 200 MG T: 200 | 90 days supply | Qty: 90 | Fill #0

## 2015-08-08 ENCOUNTER — Ambulatory Visit (HOSPITAL_COMMUNITY): Payer: Self-pay | Admitting: Physical Therapy

## 2015-08-09 MED FILL — BACLOFEN 10 MG TABLET: 10 | 15 days supply | Qty: 30 | Fill #1

## 2015-08-09 MED FILL — DULoxetine HCL 30 MG CPEP: 30 | 30 days supply | Qty: 30 | Fill #1

## 2015-08-13 DIAGNOSIS — F331 Major depressive disorder, recurrent, moderate: Secondary | ICD-10-CM | POA: Diagnosis not present

## 2015-08-14 MED FILL — ZALEPLON 5 MG CAPSULE: 5 | 30 days supply | Qty: 60 | Fill #0

## 2015-08-15 ENCOUNTER — Ambulatory Visit (HOSPITAL_COMMUNITY): Payer: 59 | Attending: Family Medicine | Admitting: Physical Therapy

## 2015-08-15 DIAGNOSIS — G729 Myopathy, unspecified: Secondary | ICD-10-CM | POA: Diagnosis not present

## 2015-08-15 DIAGNOSIS — S239XXD Sprain of unspecified parts of thorax, subsequent encounter: Secondary | ICD-10-CM | POA: Insufficient documentation

## 2015-08-15 DIAGNOSIS — R293 Abnormal posture: Secondary | ICD-10-CM | POA: Diagnosis not present

## 2015-08-15 DIAGNOSIS — X58XXXD Exposure to other specified factors, subsequent encounter: Secondary | ICD-10-CM | POA: Insufficient documentation

## 2015-08-15 DIAGNOSIS — M6289 Other specified disorders of muscle: Secondary | ICD-10-CM

## 2015-08-15 DIAGNOSIS — M6281 Muscle weakness (generalized): Secondary | ICD-10-CM | POA: Insufficient documentation

## 2015-08-15 DIAGNOSIS — M2560 Stiffness of unspecified joint, not elsewhere classified: Secondary | ICD-10-CM | POA: Insufficient documentation

## 2015-08-15 DIAGNOSIS — M546 Pain in thoracic spine: Secondary | ICD-10-CM | POA: Insufficient documentation

## 2015-08-15 NOTE — Patient Instructions (Signed)
   Cat - Cow  Kneeling on hands and knees, round your back and hips up into a "cat" position (see pic), then allow your midsection to lower into a "cow" position. Return to neutral and repeat.   You may hold the "cat" phase longer if you feel like it is helping you and stretching the sore muscles.   Repeat 10 times, twice a day.     THORACIC ROTATION - QUADRUPED  While in a crawl position, lower your buttock a little towards your feet to get in a lower position as shown.   Next, with a hand behind your head, rotate your body and your head to the side, then return.   Repeat 10 times each side, twice a day.

## 2015-08-15 NOTE — Therapy (Signed)
[facility] [address] [place], [place], [address] Phone: [phone]   Fax:  [phone]  Physical Therapy Evaluation  Patient Details  Name: Valerie Evans MRN: [id number] Date of Birth: 03/14/1986 Referring Provider: Lanell Personshomas Kingsley   Encounter Date: 08/15/2015      PT End of Session - 08/15/15 1751    Visit Number 1   Number of Visits 8   Date for PT Re-Evaluation 09/12/15   Authorization Type [employer] (6 sessions given initially but checking to see if we can get more at time of initial eval)   Authorization Time Period 08/15/15 to 09/15/15   Authorization - Visit Number 1   Authorization - Number of Visits 6   PT Start Time 1607   PT Stop Time 1645   PT Time Calculation (min) 38 min   Activity Tolerance Patient tolerated treatment well   [facility] assessed/performed      Past Medical History  Diagnosis Date  . Chronic headache   . UTI (lower urinary tract infection)   . Anxiety   . Depression   . Thyroid disease   . Lupus (HCC)     No past surgical history on file.  There were no vitals filed for this visit.  Visit Diagnosis:  Sprain of thoracic spine, subsequent encounter - Plan: PT plan of care cert/re-cert  Poor posture - Plan: PT plan of care cert/re-cert  Muscle stiffness - Plan: PT plan of care cert/re-cert  Joint stiffness of spine - Plan: PT plan of care cert/re-cert  Right-sided thoracic back pain - Plan: PT plan of care cert/re-cert  Muscle weakness - Plan: PT plan of care cert/re-cert      Subjective Assessment - 08/15/15 1610    Subjective Patient reports that she works as Charity fundraiserN at [employer]; she was trying to keep a patient, who was much taller and larger than her and combative, from falling. She tried to keep her up and from falling. She reports that she hurt her back in this situation, this all occurred in January.  It takes her awhile wo get cofmortable  during sleep. She reports that the pain radiates out into her shoulder a bit; not constant, but when it does hurt, its a spasming and sore sort of feeling. Has been using heat and biofreeze as well as essential oils; usese aleive and advil during the day. Has been trying to do some exercises to stretch area, which was recommended by MD.    Pertinent History hx of BPPV, anxiety, lupus    How long can you sit comfortably? sitting still would be just minutes; if she can wiggle around and weight shift, maybe an hour    Patient Stated Goals go back to full duty at work; sleep better    Currently in Pain? Yes   Pain Score 8    Pain Location Thoracic   Pain Orientation Right   Pain Descriptors / Indicators Spasm;Sore;Aching   Pain Type Acute pain   Pain Radiating Towards radiates out towards shoulder a bit    Pain Onset More than a month ago   Pain Frequency Intermittent   Aggravating Factors  sitting, sleeping/laying down, not getting enough sleep    Pain Relieving Factors heat   Effect of Pain on Daily Activities not at full duty at work, has a hard time with her regular yoga exercises  [facility] PT Assessment - 08/15/15 0001    Assessment   Medical Diagnosis thoracic spien sprain    Referring Provider Lanell Persons    Onset Date/Surgical Date --  January 2017   Next MD Visit Theresia Lo in one week    Precautions   Precaution Comments cannot lift more than 20 pounds, no overhead reaching , pushing, pulling    Balance Screen   Has the patient fallen in the past 6 months Yes   How many times? 1- went down steps and just missed a step and fell    Has the patient had a decrease in activity level because of a fear of falling?  No   Is the patient reluctant to leave their home because of a fear of falling?  No   Prior Function   Level of Independence Independent;Independent with basic ADLs;Independent with gait;Independent with transfers   Vocation Full time employment   Vocation  Requirements on her feet a lot, overhead reaching ,patient management    Leisure yoga    Observation/Other Assessments   Observations significant scapular winging noted with quadruped/pushup position, indicating scapular muscle weakness and possible dyskinesia    Focus on Therapeutic Outcomes (FOTO)  44% limited    Posture/Postural Control   Posture Comments forward head, reduced spinal curves    AROM   Overall AROM Comments cervical and shoulder ROM WFL    Right Shoulder Internal Rotation --  T6   Right Shoulder External Rotation --  T2   Left Shoulder Internal Rotation --  T6   Left Shoulder External Rotation --  T2   Thoracic Flexion moderate limitation    Thoracic Extension min limitation, some discomfort    Thoracic - Right Side Bend [facility]    Thoracic - Left Side Bend moderate limitation    Thoracic - Right Rotation [facility]    Thoracic - Left Rotation min-mod limitation, some discomfort    Strength   Overall Strength Comments L upper trap 4+/5, R 4/5; scap retraction L 4+/5 and L 4/5 measured in sitting    Palpation   Palpation comment displays multiple trigger points, however R levator scap and rhomboids are especially tight; L subscapularis much more knotted than R    Ambulation/Gait   Gait Comments very stiff posturing of spine, R shoulder lower than L                            PT Education - 08/15/15 1750    Education provided Yes   Education Details prognosis, plan of care, HEP; front desk checking to see if we can get more than 6 sessions from workers comp   [employer]) Educated Patient   Methods Explanation;Demonstration;Handout   Comprehension Verbalized understanding;Returned demonstration;Need further instruction          PT Short Term Goals - 08/15/15 1803    PT SHORT TERM GOAL #1   Title Patient to demonstate no to minimal limitation on all planes of Thoracic spine motion in order to improve overall function and posture, reduce pain    Time 2    Period Weeks   Status New   PT SHORT TERM GOAL #2   Title Patient to be able to maintain correct posture during all functional tasks and in all functional positions at least 75% of the time in order to assist in improving mechanics and reducing pain    Time 2   Period Weeks   Status New  PT SHORT TERM GOAL #3   Title Patient to experience pain no more than 4/10 in her thoracic region in order to improve general QOL and functional task performance    Time 2   Period Weeks   Status New   PT SHORT TERM GOAL #4   Title Patient to be independent in correctly and consistently performing appriopriate HEP, to be updated PRN    Time 2   Period Weeks   Status New           PT Long Term Goals - 08/15/15 1805    PT LONG TERM GOAL #1   Title Patient to demonstrate 5/5 strength in all tested muscle groups and will demonstrate no to minimal scapular winging in order to demonstrate improved stability of region and to assist in preventing reoccurrance of injury    Time 4   Period Weeks   Status New   PT LONG TERM GOAL #2   Title Patient to demonstrate no to minimal muscle guarding/tightness and trigger points throughout bilateral shoulder girdle and thoracic spine regions in order to assist in reducing pain and improving posture    Time 4   Period Weeks   Status New   PT LONG TERM GOAL #3   Title Patient to report that she has been experiencing no more than 2/10 pain in her thoracic region in order to improve her sleep patterns and allow her to return to yoga exercises for improved QOL    Time 4   Period Weeks   Status New   PT LONG TERM GOAL #4   Title Patient to report she has been able to return to full duties at work as an Charity fundraiser with no exacerbation of pain and with correct posture/mechanics throughout in order to reduce chances of injury recurrance and improve QOL    Time 4   Period Weeks   Status New               Plan - 08/15/15 1752    Clinical Impression Statement  Patient arrives reporting of R sided thoracic spine pain that radiates out into her shoulder a bit; the initial injjury occurred when she was trying to keep a combative patient from falling at her job as a Charity fundraiser with [employer]. Patient very thin in general, ribs and rib heads, other boney structures very easily palpated today; no increased pain with inspiration and note that patient does have Lupus. Upon examination, patient does display  considerable weakness of scapular stabilizers and scapular winging with quadruped, some thoracic spine ROM limitations, significant amount of trigger points and muscule stiffness/guarding, poor posture, and reduced functional task performance at this time. Recommend skilled PT services to address functional limitations and to assist in reaching optimal level of function.    Pt will benefit from skilled therapeutic intervention in order to improve on the following deficits Hypomobility;Decreased strength;Pain;Increased muscle spasms;Decreased range of motion;Improper body mechanics;Impaired flexibility;Postural dysfunction   Rehab Potential Excellent   PT Frequency 2x / week   PT Duration 4 weeks   PT Treatment/Interventions ADLs/Self Care Home Management;Cryotherapy;Biofeedback;Moist Heat;Therapeutic activities;Therapeutic exercise;Neuromuscular re-education;Patient/family education;Manual techniques;Passive range of motion;Taping   PT Next Visit Plan reivew HEP and goals; mobility exercises for thoracic spine; manual PRN; postural training; follow up on extension of visits by workers comp    PT Home Exercise Plan given    Consulted and Agree with Plan of Care Patient         Problem List Patient Active Problem  List   Diagnosis Date Noted  . Panic attack as reaction to stress 03/17/2013  . Generalized anxiety disorder 03/17/2013  . Sinusitis, chronic 03/09/2013  . Asthma, chronic 03/09/2013  . Benign paroxysmal positional vertigo 03/09/2013    Nedra Hai PT,  DPT [phone]  [facility] [address] [place], [place], [address] Phone: [phone]   Fax:  [phone]  Name: Valerie Evans MRN: [id number] Date of Birth: [birth year]

## 2015-08-17 ENCOUNTER — Telehealth (HOSPITAL_COMMUNITY): Payer: Self-pay | Admitting: Physical Therapy

## 2015-08-17 NOTE — Telephone Encounter (Signed)
Amy from [employer] called to state that patient will have f/u w/MD on monday. After that visit she will review the MD note and will call us if more visit will be approved for this patient at that time. NF 08/17/15

## 2015-08-21 DIAGNOSIS — Z09 Encounter for follow-up examination after completed treatment for conditions other than malignant neoplasm: Secondary | ICD-10-CM | POA: Diagnosis not present

## 2015-08-21 DIAGNOSIS — R5381 Other malaise: Secondary | ICD-10-CM | POA: Diagnosis not present

## 2015-08-21 DIAGNOSIS — M797 Fibromyalgia: Secondary | ICD-10-CM | POA: Diagnosis not present

## 2015-08-21 DIAGNOSIS — M3219 Other organ or system involvement in systemic lupus erythematosus: Secondary | ICD-10-CM | POA: Diagnosis not present

## 2015-08-21 MED FILL — VENTOLIN HFA 90 MCG INHALER: 108 (90 BAS | 16 days supply | Qty: 18 | Fill #0

## 2015-08-23 ENCOUNTER — Encounter (HOSPITAL_COMMUNITY): Payer: Self-pay

## 2015-08-27 ENCOUNTER — Ambulatory Visit (HOSPITAL_COMMUNITY): Payer: PRIVATE HEALTH INSURANCE | Attending: Family Medicine | Admitting: Physical Therapy

## 2015-08-27 DIAGNOSIS — R252 Cramp and spasm: Secondary | ICD-10-CM | POA: Diagnosis not present

## 2015-08-27 DIAGNOSIS — M546 Pain in thoracic spine: Secondary | ICD-10-CM | POA: Insufficient documentation

## 2015-08-27 DIAGNOSIS — G729 Myopathy, unspecified: Secondary | ICD-10-CM | POA: Insufficient documentation

## 2015-08-27 DIAGNOSIS — S239XXD Sprain of unspecified parts of thorax, subsequent encounter: Secondary | ICD-10-CM | POA: Insufficient documentation

## 2015-08-27 DIAGNOSIS — X58XXXD Exposure to other specified factors, subsequent encounter: Secondary | ICD-10-CM | POA: Insufficient documentation

## 2015-08-27 DIAGNOSIS — M2560 Stiffness of unspecified joint, not elsewhere classified: Secondary | ICD-10-CM | POA: Diagnosis not present

## 2015-08-27 DIAGNOSIS — R293 Abnormal posture: Secondary | ICD-10-CM | POA: Insufficient documentation

## 2015-08-27 DIAGNOSIS — M6281 Muscle weakness (generalized): Secondary | ICD-10-CM | POA: Insufficient documentation

## 2015-08-27 DIAGNOSIS — M6289 Other specified disorders of muscle: Secondary | ICD-10-CM

## 2015-08-27 NOTE — Therapy (Signed)
[facility] [address] [place], [place], [address] Phone: [phone]   Fax:  [phone]  Physical Therapy Treatment  Patient Details  Name: Valerie LuoJoanna M Labarbera MRN: [id number] Date of Birth: 12/28/1985 Referring Provider: Lanell Personshomas Kingsley   Encounter Date: 08/27/2015      PT End of Session - 08/27/15 1810    Visit Number 2   Number of Visits 8   Date for PT Re-Evaluation 09/12/15   Authorization Type [employer] (6 sessions given initially but checking to see if we can get more at time of initial eval)   Authorization Time Period 08/15/15 to 09/15/15   Authorization - Visit Number 2   Authorization - Number of Visits 6   PT Start Time 1515   PT Stop Time 1600   PT Time Calculation (min) 45 min   Activity Tolerance Patient tolerated treatment well   Behavior During Therapy [facility] for tasks assessed/performed      Past Medical History  Diagnosis Date  . Chronic headache   . UTI (lower urinary tract infection)   . Anxiety   . Depression   . Thyroid disease   . Lupus (HCC)     No past surgical history on file.  There were no vitals filed for this visit.  Visit Diagnosis:  Sprain of thoracic spine, subsequent encounter  Poor posture  Muscle stiffness  Joint stiffness of spine  Right-sided thoracic back pain  Muscle weakness      Subjective Assessment - 08/27/15 1521    Subjective Pt reporting she is about the same since she was last here. Says she has been performing her HEP at home without much difficulty. No other complaints or concerns.    Pertinent History hx of BPPV, anxiety, lupus    Currently in Pain? Yes   Pain Score 7    Pain Location Thoracic   Pain Orientation Right   Pain Descriptors / Indicators Spasm;Sore;Aching   Pain Type Acute pain   Pain Radiating Towards none noted    Pain Onset More than a month ago   Pain Frequency Intermittent                         [facility]  Adult PT Treatment/Exercise - 08/27/15 0001    Exercises   Exercises Shoulder;Lumbar   Lumbar Exercises: Quadruped   [place]/Old Horse 10 reps   Shoulder Exercises: Supine   Other Supine Exercises BUE pressdown with alternating SLR, shoulder retraction and depression, x10 each    Shoulder Exercises: Seated   Row Both;10 reps;Theraband   Theraband Level (Shoulder Row) Level 2 (Red)   Row Limitations cuing for shoulder retraction and depression, (+) shoulder shrug noted   Shoulder Exercises: Stretch   Corner Stretch 3 reps;30 seconds   Manual Therapy   Manual Therapy Soft tissue mobilization   Manual therapy comments Performed separately from all other interventions   Soft tissue mobilization Trigger point release along thoracic paraspinals, Rt rhomboids, Rt levator scap                 PT Education - 08/27/15 1809    Education provided Yes   Education Details Provided copy of initial evaluation; updated HEP; discussed implications for manual treatment and importance of correct posture   Person(s) Educated Patient   Methods Explanation;Demonstration;Handout   Comprehension Returned demonstration;Verbalized understanding          PT Short Term Goals - 08/15/15 1803  PT SHORT TERM GOAL #1   Title Patient to demonstate no to minimal limitation on all planes of Thoracic spine motion in order to improve overall function and posture, reduce pain    Time 2   Period Weeks   Status New   PT SHORT TERM GOAL #2   Title Patient to be able to maintain correct posture during all functional tasks and in all functional positions at least 75% of the time in order to assist in improving mechanics and reducing pain    Time 2   Period Weeks   Status New   PT SHORT TERM GOAL #3   Title Patient to experience pain no more than 4/10 in her thoracic region in order to improve general QOL and functional task performance    Time 2   Period Weeks   Status New   PT SHORT TERM GOAL #4    Title Patient to be independent in correctly and consistently performing appriopriate HEP, to be updated PRN    Time 2   Period Weeks   Status New           PT Long Term Goals - 08/15/15 1805    PT LONG TERM GOAL #1   Title Patient to demonstrate 5/5 strength in all tested muscle groups and will demonstrate no to minimal scapular winging in order to demonstrate improved stability of region and to assist in preventing reoccurrance of injury    Time 4   Period Weeks   Status New   PT LONG TERM GOAL #2   Title Patient to demonstrate no to minimal muscle guarding/tightness and trigger points throughout bilateral shoulder girdle and thoracic spine regions in order to assist in reducing pain and improving posture    Time 4   Period Weeks   Status New   PT LONG TERM GOAL #3   Title Patient to report that she has been experiencing no more than 2/10 pain in her thoracic region in order to improve her sleep patterns and allow her to return to yoga exercises for improved QOL    Time 4   Period Weeks   Status New   PT LONG TERM GOAL #4   Title Patient to report 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 Today's session focused on manual treatment for pain relief as well as therex and education to improve her postural control. Pt resonded well to manual treatment and therex with decreased pain report to 4/10 by the end of today's session. Will continue current POC.    Pt will benefit from skilled therapeutic intervention in order to improve on the following deficits Hypomobility;Decreased strength;Pain;Increased muscle  spasms;Decreased range of motion;Improper body mechanics;Impaired flexibility;Postural dysfunction   Rehab Potential Excellent   PT Frequency 2x / week   PT Duration 4 weeks   PT Treatment/Interventions ADLs/Self Care Home Management;Cryotherapy;Biofeedback;Moist Heat;Therapeutic activities;Therapeutic exercise;Neuromuscular re-education;Patient/family education;Manual techniques;Passive range of motion;Taping   PT Next Visit Plan self mob for thoracic extension; manual PRN for soft tissue restrictions; postural training; follow up  on extension of visits by workers comp    PT Home Exercise Plan updated with supine UE grip and pressdown with alternating SLR; corner stretch and scapular rows with red TB   Consulted and Agree with Plan of Care Patient        Problem List Patient Active Problem List   Diagnosis Date Noted  . Panic attack as reaction to stress 03/17/2013  . Generalized anxiety disorder 03/17/2013  . Sinusitis, chronic 03/09/2013  . Asthma, chronic 03/09/2013  . Benign paroxysmal positional vertigo 03/09/2013    6:15 PM,08/27/2015 Marylyn Ishihara PT, DPT [facility] [phone]  [facility] [address] [place], [place], [address] Phone: [phone]   Fax:  [phone]  Name: TELINA KLECKLEY MRN: [id number] Date of Birth: November 20, 1985

## 2015-08-29 ENCOUNTER — Ambulatory Visit (HOSPITAL_COMMUNITY): Payer: PRIVATE HEALTH INSURANCE

## 2015-08-29 DIAGNOSIS — G729 Myopathy, unspecified: Secondary | ICD-10-CM | POA: Diagnosis not present

## 2015-08-29 DIAGNOSIS — M6281 Muscle weakness (generalized): Secondary | ICD-10-CM | POA: Diagnosis not present

## 2015-08-29 DIAGNOSIS — M546 Pain in thoracic spine: Secondary | ICD-10-CM | POA: Diagnosis not present

## 2015-08-29 DIAGNOSIS — R293 Abnormal posture: Secondary | ICD-10-CM

## 2015-08-29 DIAGNOSIS — M2560 Stiffness of unspecified joint, not elsewhere classified: Secondary | ICD-10-CM | POA: Diagnosis not present

## 2015-08-29 DIAGNOSIS — S239XXD Sprain of unspecified parts of thorax, subsequent encounter: Secondary | ICD-10-CM

## 2015-08-29 DIAGNOSIS — M6289 Other specified disorders of muscle: Secondary | ICD-10-CM

## 2015-08-29 DIAGNOSIS — R252 Cramp and spasm: Secondary | ICD-10-CM | POA: Diagnosis not present

## 2015-08-29 NOTE — Therapy (Signed)
[facility] [address] [place], [place], [address] Phone: [phone]   Fax:  [phone]  Physical Therapy Treatment  Patient Details  Name: Valerie Evans MRN: [id number] Date of Birth: 04/24/1986 Referring Provider: Lanell Persons   Encounter Date: 08/29/2015      PT End of Session - 08/29/15 1733    Visit Number 3   Number of Visits 8   Date for PT Re-Evaluation 09/12/15   Authorization Type [employer] (6 sessions given initially but checking to see if we can get more at time of initial eval)   Authorization Time Period 08/15/15 to 09/15/15   Authorization - Visit Number 3   Authorization - Number of Visits 6   PT Start Time 1728   PT Stop Time 1812   PT Time Calculation (min) 44 min   Activity Tolerance Patient tolerated treatment well   Behavior During Therapy [facility] for tasks assessed/performed      Past Medical History  Diagnosis Date  . Chronic headache   . UTI (lower urinary tract infection)   . Anxiety   . Depression   . Thyroid disease   . Lupus (HCC)     No past surgical history on file.  There were no vitals filed for this visit.  Visit Diagnosis:  Sprain of thoracic spine, subsequent encounter  Poor posture  Muscle stiffness  Joint stiffness of spine  Right-sided thoracic back pain  Muscle weakness      Subjective Assessment - 08/29/15 1726    Subjective Pt stated she has had a rough day, her Lupus and thoracic pain today, pain scale 8/10.  Pt reports relief following manual last session   Pertinent History hx of BPPV, anxiety, lupus    Patient Stated Goals go back to full duty at work; sleep better    Currently in Pain? Yes   Pain Score 8    Pain Location Thoracic   Pain Orientation Right   Pain Descriptors / Indicators Sore;Aching   Pain Type Acute pain   Pain Onset More than a month ago   Pain Frequency Intermittent   Aggravating Factors  sitting, sleeping/laying down,  not getting enough sleep   Pain Relieving Factors heat   Effect of Pain on Daily Activities not at full duty at work, has a hard time with her regular yoga exercises            [facility] Adult PT Treatment/Exercise - 08/29/15 0001    Lumbar Exercises: Quadruped   Madcat/Old Horse 10 reps   Shoulder Exercises: Seated   Other Seated Exercises 3D thoracic excursion 10x   Shoulder Exercises: Standing   Extension 10 reps;Theraband   Theraband Level (Shoulder Extension) Level 2 (Red)   Row Both;10 reps;Theraband   Theraband Level (Shoulder Row) Level 2 (Red)   Other Standing Exercises standing infront of door with shoulder flexion 10x for posture strengthening   Shoulder Exercises: Stretch   Corner Stretch 3 reps;30 seconds   Manual Therapy   Manual Therapy Soft tissue mobilization   Manual therapy comments Performed separately from all other interventions, prone position   Soft tissue mobilization Trigger point release along thoracic paraspinals, Rt rhomboids, Rt levator scap              PT Short Term Goals - 08/15/15 1803    PT SHORT TERM GOAL #1   Title Patient to demonstate no to minimal limitation on all planes of Thoracic spine motion in  order to improve overall function and posture, reduce pain    Time 2   Period Weeks   Status New   PT SHORT TERM GOAL #2   Title Patient to be able to maintain correct posture during all functional tasks and in all functional positions at least 75% of the time in order to assist in improving mechanics and reducing pain    Time 2   Period Weeks   Status New   PT SHORT TERM GOAL #3   Title Patient to experience pain no more than 4/10 in her thoracic region in order to improve general QOL and functional task performance    Time 2   Period Weeks   Status New   PT SHORT TERM GOAL #4   Title Patient to be independent in correctly and consistently performing appriopriate HEP, to be updated PRN    Time 2   Period Weeks   Status New            PT Long Term Goals - 08/15/15 1805    PT LONG TERM GOAL #1   Title Patient to demonstrate 5/5 strength in all tested muscle groups and will demonstrate no to minimal scapular winging in order to demonstrate improved stability of region and to assist in preventing reoccurrance of injury    Time 4   Period Weeks   Status New   PT LONG TERM GOAL #2   Title Patient to demonstrate no to minimal muscle guarding/tightness and trigger points throughout bilateral shoulder girdle and thoracic spine regions in order to assist in reducing pain and improving posture    Time 4   Period Weeks   Status New   PT LONG TERM GOAL #3   Title Patient to report that she has been experiencing no more than 2/10 pain in her thoracic region in order to improve her sleep patterns and allow her to return to yoga exercises for improved QOL    Time 4   Period Weeks   Status New   PT LONG TERM GOAL #4   Title Patient to report 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  Session focus on manual techniques to reduce spasms and trigger point pain Rt thoracic region, able to reduce spasms Levator scapula and paraspinals with reports of relief following.  Educated importance of proper posture for pain relief, added therex for postural strengthening wtih no reports of increased pain throughout session.     Pt will benefit from skilled therapeutic intervention in order to improve on the following deficits Hypomobility;Decreased strength;Pain;Increased muscle spasms;Decreased range of motion;Improper body mechanics;Impaired flexibility;Postural  dysfunction   Rehab Potential Excellent   PT Frequency 2x / week   PT Duration 4 weeks   PT Treatment/Interventions ADLs/Self Care Home Management;Cryotherapy;Biofeedback;Moist Heat;Therapeutic activities;Therapeutic exercise;Neuromuscular re-education;Patient/family education;Manual techniques;Passive range of motion;Taping   PT Next Visit Plan self mob for thoracic extension; manual PRN for soft tissue restrictions; postural training; follow up on extension of visits by workers comp  Problem List Patient Active Problem List   Diagnosis Date Noted  . Panic attack as reaction to stress 03/17/2013  . Generalized anxiety disorder 03/17/2013  . Sinusitis, chronic 03/09/2013  . Asthma, chronic 03/09/2013  . Benign paroxysmal positional vertigo 03/09/2013   Becky Sax, LPTA; CBIS [phone]  Juel Burrow 08/29/2015, 6:33 PM   [facility] [address] [place], [place], [address] Phone: [phone]   Fax:  [phone]  Name: Valerie Evans MRN: [id number] Date of Birth: August 17, 1985

## 2015-08-30 ENCOUNTER — Telehealth (HOSPITAL_COMMUNITY): Payer: Self-pay | Admitting: Vascular Surgery

## 2015-08-30 ENCOUNTER — Telehealth (HOSPITAL_COMMUNITY): Payer: Self-pay | Admitting: *Deleted

## 2015-08-30 DIAGNOSIS — R0600 Dyspnea, unspecified: Secondary | ICD-10-CM

## 2015-08-30 NOTE — Telephone Encounter (Signed)
Left pt message to make new pt appt 

## 2015-08-30 NOTE — Telephone Encounter (Signed)
Received referral from Dr Fatima Sangereveshwar's office, per Dr Gala RomneyBensimhon pt needs echo, pft's and new pt appt with him, orders placed will schedule

## 2015-09-03 ENCOUNTER — Ambulatory Visit (HOSPITAL_COMMUNITY): Payer: PRIVATE HEALTH INSURANCE | Admitting: Physical Therapy

## 2015-09-03 DIAGNOSIS — S239XXD Sprain of unspecified parts of thorax, subsequent encounter: Secondary | ICD-10-CM | POA: Diagnosis not present

## 2015-09-03 DIAGNOSIS — R293 Abnormal posture: Secondary | ICD-10-CM | POA: Diagnosis not present

## 2015-09-03 DIAGNOSIS — M546 Pain in thoracic spine: Secondary | ICD-10-CM

## 2015-09-03 DIAGNOSIS — G729 Myopathy, unspecified: Secondary | ICD-10-CM | POA: Diagnosis not present

## 2015-09-03 DIAGNOSIS — M6281 Muscle weakness (generalized): Secondary | ICD-10-CM | POA: Diagnosis not present

## 2015-09-03 DIAGNOSIS — M2560 Stiffness of unspecified joint, not elsewhere classified: Secondary | ICD-10-CM | POA: Diagnosis not present

## 2015-09-03 DIAGNOSIS — R252 Cramp and spasm: Secondary | ICD-10-CM | POA: Diagnosis not present

## 2015-09-03 NOTE — Therapy (Signed)
[facility] [address] [place], [place], [address] Phone: [phone]   Fax:  [phone]  Physical Therapy Treatment  Patient Details  Name: Valerie Evans MRN: [id number] Date of Birth: [birth year] Referring Provider: Lanell Persons   Encounter Date: 09/03/2015      PT End of Session - 09/03/15 1804    Visit Number 4   Number of Visits 8   Date for PT Re-Evaluation 09/12/15   Authorization Type [employer] [employer] (6 sessions given initially but checking to see if we can get more at time of initial eval)   Authorization Time Period 08/15/15 to 09/15/15   Authorization - Visit Number 4   Authorization - Number of Visits 6   PT Start Time 1601   PT Stop Time 1642   PT Time Calculation (min) 41 min   Activity Tolerance Patient tolerated treatment well   Behavior During Therapy [facility] for tasks assessed/performed      Past Medical History  Diagnosis Date  . Chronic headache   . UTI (lower urinary tract infection)   . Anxiety   . Depression   . Thyroid disease   . Lupus (HCC)     No past surgical history on file.  There were no vitals filed for this visit.      Subjective Assessment - 09/03/15 1602    Subjective Patient reports pain 6/10 today, cleaned some things yesterday and reports that she feels like she aggravated it a bit. Trigger point release has been working really well for her, she is feeling better with PT. Reaching is still difficult for her, pushing and pulling is still difficult as well.    Pertinent History hx of BPPV, anxiety, lupus    How long can you sit comfortably? sitting still would be just minutes; if she can wiggle around and weight shift, maybe an hour    Patient Stated Goals go back to full duty at work; sleep better    Currently in Pain? Yes   Pain Score 6    Pain Location Thoracic   Pain Orientation Right                         [facility] Adult PT Treatment/Exercise -  09/03/15 0001    Lumbar Exercises: Quadruped   Madcat/Old Horse 10 reps   Madcat/Old Horse Limitations also thoracic rotations in quadruped 1x10, also prone thoracic rotations 3x30 seconds each side    Shoulder Exercises: Seated   Other Seated Exercises 3D throacic and cervical excursions 1x10   Shoulder Exercises: Standing   Extension 10 reps;Theraband   Theraband Level (Shoulder Extension) Level 2 (Red)   Row Both;10 reps;Theraband   Theraband Level (Shoulder Row) Level 2 (Red)   Shoulder Exercises: ROM/Strengthening   UBE (Upper Arm Bike) x4 minutes backwards on level 1    Manual Therapy   Manual Therapy Soft tissue mobilization   Manual therapy comments Performed separately from all other interventions, prone position   Soft tissue mobilization Trigger point release along thoracic paraspinals, Rt rhomboids, Rt levator scap                 PT Education - 09/03/15 1804    Education provided No          PT Short Term Goals - 08/15/15 1803    PT SHORT TERM GOAL #1   Title Patient to demonstate no to minimal limitation on all planes of Thoracic  spine motion in order to improve overall function and posture, reduce pain    Time 2   Period Weeks   Status New   PT SHORT TERM GOAL #2   Title Patient to be able to maintain correct posture during all functional tasks and in all functional positions at least 75% of the time in order to assist in improving mechanics and reducing pain    Time 2   Period Weeks   Status New   PT SHORT TERM GOAL #3   Title Patient to experience pain no more than 4/10 in her thoracic region in order to improve general QOL and functional task performance    Time 2   Period Weeks   Status New   PT SHORT TERM GOAL #4   Title Patient to be independent in correctly and consistently performing appriopriate HEP, to be updated PRN    Time 2   Period Weeks   Status New           PT Long Term Goals - 08/15/15 1805    PT LONG TERM GOAL #1    Title Patient to demonstrate 5/5 strength in all tested muscle groups and will demonstrate no to minimal scapular winging in order to demonstrate improved stability of region and to assist in preventing reoccurrance of injury    Time 4   Period Weeks   Status New   PT LONG TERM GOAL #2   Title Patient to demonstrate no to minimal muscle guarding/tightness and trigger points throughout bilateral shoulder girdle and thoracic spine regions in order to assist in reducing pain and improving posture    Time 4   Period Weeks   Status New   PT LONG TERM GOAL #3   Title Patient to report that she has been experiencing no more than 2/10 pain in her thoracic region in order to improve her sleep patterns and allow her to return to yoga exercises for improved QOL    Time 4   Period Weeks   Status New   PT LONG TERM GOAL #4   Title Patient to report 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 Began session with mobility work and stretches primarily for thoracic spine, also performed standing functional UE exercises and introduced UBE today. Finished session with trigger point release performed prone on table; patient reported reduction in pain by 1/10 points.    Rehab Potential Excellent   PT Frequency 2x / week   PT Duration 4 weeks   PT Treatment/Interventions ADLs/Self Care Home Management;Cryotherapy;Biofeedback;Moist Heat;Therapeutic activities;Therapeutic exercise;Neuromuscular re-education;Patient/family education;Manual techniques;Passive range of motion;Taping   PT Next Visit Plan  re-assess as it will be last scheduled session; check in on if workers comp extended visits    PT Home Exercise Plan updated with supine UE grip and pressdown with alternating SLR; corner stretch and scapular rows with red TB   Consulted and Agree with Plan of Care Patient      Patient will benefit from skilled  therapeutic intervention in order to improve the following deficits and impairments:  Hypomobility, Decreased strength, Pain, Increased muscle spasms, Decreased range of motion, Improper body mechanics, Impaired flexibility, Postural dysfunction  Visit Diagnosis: Pain in thoracic spine - Plan: PT plan of care cert/re-cert  Abnormal posture - Plan: PT plan of care cert/re-cert  Cramp and spasm - Plan: PT plan of care cert/re-cert  Muscle weakness (generalized) - Plan: PT plan of care cert/re-cert     Problem List Patient Active Problem List   Diagnosis Date Noted  . Panic attack as reaction to stress 03/17/2013  . Generalized anxiety disorder 03/17/2013  . Sinusitis, chronic 03/09/2013  . Asthma, chronic 03/09/2013  . Benign paroxysmal positional vertigo 03/09/2013    Nedra HaiKristen Dishawn Bhargava PT, DPT [phone]  [facility] [address] [place], [place], [address] Phone: [phone]   Fax:  [phone]  Name: Valerie LuoJoanna M Evans MRN: [id number] Date of Birth: 12/21/1985

## 2015-09-04 ENCOUNTER — Ambulatory Visit (HOSPITAL_COMMUNITY): Payer: PRIVATE HEALTH INSURANCE | Admitting: Physical Therapy

## 2015-09-04 DIAGNOSIS — G729 Myopathy, unspecified: Secondary | ICD-10-CM | POA: Diagnosis not present

## 2015-09-04 DIAGNOSIS — R293 Abnormal posture: Secondary | ICD-10-CM

## 2015-09-04 DIAGNOSIS — M6281 Muscle weakness (generalized): Secondary | ICD-10-CM | POA: Diagnosis not present

## 2015-09-04 DIAGNOSIS — M546 Pain in thoracic spine: Secondary | ICD-10-CM

## 2015-09-04 DIAGNOSIS — M2560 Stiffness of unspecified joint, not elsewhere classified: Secondary | ICD-10-CM | POA: Diagnosis not present

## 2015-09-04 DIAGNOSIS — S239XXD Sprain of unspecified parts of thorax, subsequent encounter: Secondary | ICD-10-CM | POA: Diagnosis not present

## 2015-09-04 DIAGNOSIS — R252 Cramp and spasm: Secondary | ICD-10-CM | POA: Diagnosis not present

## 2015-09-04 MED FILL — CMPD MMW LID/MAALOX/DIPHEN: 4 days supply | Qty: 240 | Fill #1

## 2015-09-04 MED FILL — BACLOFEN 10 MG TABLET: 10 | 15 days supply | Qty: 30 | Fill #2

## 2015-09-04 NOTE — Therapy (Signed)
[facility] [address] [place], [place], [address] Phone: [phone]   Fax:  [phone]  Physical Therapy Treatment  Patient Details  Name: Valerie Evans MRN: [id number] Date of Birth: 06/19/1985 Referring Provider: Lanell Personshomas Kingsley   Encounter Date: 09/04/2015      PT End of Session - 09/04/15 1736    Visit Number 5   Number of Visits 7   Date for PT Re-Evaluation 09/12/15   Authorization Type [employer] [employer] approved for 6 visits not counting her eval   Authorization Time Period 08/15/15 to 09/15/15   Authorization - Visit Number 5   Authorization - Number of Visits 7   PT Start Time 1653   PT Stop Time 1735   PT Time Calculation (min) 42 min   Activity Tolerance Patient tolerated treatment well   [facility] tasks assessed/performed      Past Medical History  Diagnosis Date  . Chronic headache   . UTI (lower urinary tract infection)   . Anxiety   . Depression   . Thyroid disease   . Lupus (HCC)     No past surgical history on file.  There were no vitals filed for this visit.      Subjective Assessment - 09/04/15 1657    Subjective Pt reports she is a little sore today from her exercises yesterday. Rt side is a 4/10, Lt side is a 8/10. She has been doing her exercises without too much difficulty. She feels she is beginning to have a Lupus flare-up and is very tired today.    Pertinent History hx of BPPV, anxiety, lupus    Currently in Pain? Yes   Pain Score 4    Pain Location Thoracic   Pain Orientation Right   Pain Descriptors / Indicators Aching;Sore   Pain Type Acute pain   Pain Radiating Towards none noted   Pain Onset More than a month ago   Pain Frequency Intermittent                         [facility] Adult PT Treatment/Exercise - 09/04/15 0001    Lumbar Exercises: Supine   Straight Leg Raise 10 reps  x1 set    Straight Leg Raises Limitations with BUE  pressdown    Lumbar Exercises: Quadruped   Single Arm Raise 20 reps;Right;Left   Shoulder Exercises: Supine   Other Supine Exercises serratus punches with each UE 2x10 reps usign 2# wt   Shoulder Exercises: Standing   Row Both;10 reps  x2 sets   Theraband Level (Shoulder Row) Level 3 (Green)   Shoulder Exercises: Stretch   Other Shoulder Stretches upper trap stretch 2x30 sec each   Other Shoulder Stretches ;evator stretch 2x30 sec each   Manual Therapy   Manual Therapy Soft tissue mobilization   Manual therapy comments Performed separately from all other interventions, prone position   Soft tissue mobilization Trigger point release along Rt rhomboids, Rt levator scap                 PT Education - 09/04/15 1740    Education provided Yes   Education Details updated HEP; reviewed importance of good posture during exercise for optimal benefit   Person(s) Educated Patient   Methods Explanation;Demonstration;Handout   Comprehension Verbalized understanding;Returned demonstration          PT Short Term Goals - 08/15/15 1803    PT SHORT TERM  GOAL #1   Title Patient to demonstate no to minimal limitation on all planes of Thoracic spine motion in order to improve overall function and posture, reduce pain    Time 2   Period Weeks   Status New   PT SHORT TERM GOAL #2   Title Patient to be able to maintain correct posture during all functional tasks and in all functional positions at least 75% of the time in order to assist in improving mechanics and reducing pain    Time 2   Period Weeks   Status New   PT SHORT TERM GOAL #3   Title Patient to experience pain no more than 4/10 in her thoracic region in order to improve general QOL and functional task performance    Time 2   Period Weeks   Status New   PT SHORT TERM GOAL #4   Title Patient to be independent in correctly and consistently performing appriopriate HEP, to be updated PRN    Time 2   Period Weeks   Status New            PT Long Term Goals - 08/15/15 1805    PT LONG TERM GOAL #1   Title Patient to demonstrate 5/5 strength in all tested muscle groups and will demonstrate no to minimal scapular winging in order to demonstrate improved stability of region and to assist in preventing reoccurrance of injury    Time 4   Period Weeks   Status New   PT LONG TERM GOAL #2   Title Patient to demonstrate no to minimal muscle guarding/tightness and trigger points throughout bilateral shoulder girdle and thoracic spine regions in order to assist in reducing pain and improving posture    Time 4   Period Weeks   Status New   PT LONG TERM GOAL #3   Title Patient to report that she has been experiencing no more than 2/10 pain in her thoracic region in order to improve her sleep patterns and allow her to return to yoga exercises for improved QOL    Time 4   Period Weeks   Status New   PT LONG TERM GOAL #4   Title Patient to report 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 This session focused on therex to address postural control as well as manual techniques to improve soft tissue restrictions and pain. Pt continues to demonstrate upper trap compensation with UE elevation activity. She responded well to manual techniques with improved pain report by the end of today's session. Will continue with current POC.   Rehab Potential Excellent   PT Frequency 2x / week   PT Duration 4 weeks   PT Treatment/Interventions ADLs/Self Care Home Management;Cryotherapy;Biofeedback;Moist Heat;Therapeutic activities;Therapeutic exercise;Neuromuscular  re-education;Patient/family education;Manual techniques;Passive range of motion;Taping   PT Next Visit Plan Continue with shoulder stablization therex, serratus, lower trap/rhomboid activation with elevation. manual techniques as appropriate   PT Home Exercise Plan updated with supine UE grip and pressdown with alternating SLR; corner stretch and scapular rows with  red TB; updated 09/04/15 with upper trap/levator stretch   Consulted and Agree with Plan of Care Patient      Patient will benefit from skilled therapeutic intervention in order to improve the following deficits and impairments:  Hypomobility, Decreased strength, Pain, Increased muscle spasms, Decreased range of motion, Improper body mechanics, Impaired flexibility, Postural dysfunction  Visit Diagnosis: Pain in thoracic spine  Abnormal posture  Cramp and spasm  Muscle weakness (generalized)     Problem List Patient Active Problem List   Diagnosis Date Noted  . Panic attack as reaction to stress 03/17/2013  . Generalized anxiety disorder 03/17/2013  . Sinusitis, chronic 03/09/2013  . Asthma, chronic 03/09/2013  . Benign paroxysmal positional vertigo 03/09/2013    5:51 PM,09/04/2015 Marylyn Ishihara PT, DPT [facility] [phone]  [facility] [address] [place], [place], [address] Phone: [phone]   Fax:  [phone]  Name: Valerie Evans MRN: [id number] Date of Birth: 24-Jan-1986

## 2015-09-07 MED FILL — ALPRAZolam 0.5 MG TABS: 0.5 | 76 days supply | Qty: 305 | Fill #0

## 2015-09-10 ENCOUNTER — Ambulatory Visit (HOSPITAL_COMMUNITY)
Admission: RE | Admit: 2015-09-10 | Discharge: 2015-09-10 | Disposition: A | Discharge status: Home / Self Care | Payer: 59 | Source: Ambulatory Visit | Attending: Internal Medicine | Admitting: Internal Medicine

## 2015-09-10 DIAGNOSIS — R0600 Dyspnea, unspecified: Secondary | ICD-10-CM | POA: Insufficient documentation

## 2015-09-10 LAB — PULMONARY FUNCTION TEST
DL/VA % PRED: 117 %
DL/VA: 4.78 ml/min/mmHg/L
DLCO UNC: 18.63 ml/min/mmHg
DLCO unc % pred: 106 %
FEF 25-75 POST: 1.37 L/s
FEF 25-75 Pre: 2.39 L/sec
FEF2575-%Change-Post: -42 %
FEF2575-%Pred-Post: 42 %
FEF2575-%Pred-Pre: 73 %
FEV1-%Change-Post: -11 %
FEV1-%PRED-POST: 70 %
FEV1-%Pred-Pre: 79 %
FEV1-PRE: 2.19 L
FEV1-Post: 1.95 L
FEV1FVC-%CHANGE-POST: -5 %
FEV1FVC-%Pred-Pre: 97 %
FEV6-%CHANGE-POST: -5 %
FEV6-%PRED-PRE: 83 %
FEV6-%Pred-Post: 78 %
FEV6-POST: 2.5 L
FEV6-PRE: 2.64 L
FEV6FVC-%PRED-POST: 100 %
FEV6FVC-%PRED-PRE: 100 %
FVC-%Change-Post: -5 %
FVC-%Pred-Post: 78 %
FVC-%Pred-Pre: 82 %
FVC-Post: 2.5 L
FVC-Pre: 2.64 L
POST FEV6/FVC RATIO: 100 %
PRE FEV1/FVC RATIO: 83 %
Post FEV1/FVC ratio: 78 %
Pre FEV6/FVC Ratio: 100 %
RV % pred: 168 %
RV: 1.87 L
TLC % PRED: 99 %
TLC: 4.29 L

## 2015-09-10 MED ORDER — ALBUTEROL SULFATE (2.5 MG/3ML) 0.083% IN NEBU
2.5000 mg | INHALATION_SOLUTION | Freq: Once | RESPIRATORY_TRACT | Status: AC
  Administered 2015-09-10: 2.5 mg via RESPIRATORY_TRACT

## 2015-09-11 ENCOUNTER — Ambulatory Visit (HOSPITAL_COMMUNITY): Payer: PRIVATE HEALTH INSURANCE | Admitting: Physical Therapy

## 2015-09-11 DIAGNOSIS — M6281 Muscle weakness (generalized): Secondary | ICD-10-CM | POA: Diagnosis not present

## 2015-09-11 DIAGNOSIS — R293 Abnormal posture: Secondary | ICD-10-CM

## 2015-09-11 DIAGNOSIS — R252 Cramp and spasm: Secondary | ICD-10-CM

## 2015-09-11 DIAGNOSIS — G729 Myopathy, unspecified: Secondary | ICD-10-CM | POA: Diagnosis not present

## 2015-09-11 DIAGNOSIS — M2560 Stiffness of unspecified joint, not elsewhere classified: Secondary | ICD-10-CM | POA: Diagnosis not present

## 2015-09-11 DIAGNOSIS — S239XXD Sprain of unspecified parts of thorax, subsequent encounter: Secondary | ICD-10-CM | POA: Diagnosis not present

## 2015-09-11 DIAGNOSIS — M546 Pain in thoracic spine: Secondary | ICD-10-CM | POA: Diagnosis not present

## 2015-09-11 NOTE — Therapy (Signed)
[facility] [address] [place], [place], [address] Phone: [phone]   Fax:  [phone]  Physical Therapy Treatment  Patient Details  Name: Valerie Evans MRN: [id number] Date of Birth: 02/18/1986 Referring Provider: Lanell Personshomas Kingsley   Encounter Date: 09/11/2015      PT End of Session - 09/11/15 1825    Visit Number 6   Number of Visits 7   Date for PT Re-Evaluation 09/13/15   Authorization Type [employer] approved for 6 visits not counting her eval (2 more approved on 4/12)   Authorization Time Period 08/15/15 to 09/15/15   Authorization - Visit Number 6   Authorization - Number of Visits 7   PT Start Time 1434   PT Stop Time 1510  6 minutes moist heat, not included in billing    PT Time Calculation (min) 36 min   Activity Tolerance Patient tolerated treatment well   Behavior [facility] Therapy [facility] tasks assessed/performed      Past Medical History  Diagnosis Date  . Chronic headache   . UTI (lower urinary tract infection)   . Anxiety   . Depression   . Thyroid disease   . Lupus (HCC)     No past surgical history on file.  There were no vitals filed for this visit.      Subjective Assessment - 09/11/15 1438    Subjective Patient reports she is feeling down today, she has official word that she lost her job; confirms that she was having the start of lupus flare up around the time of the last couple sessions    Pertinent History hx of BPPV, anxiety, lupus    Currently in Pain? Yes   Pain Score 4    Pain Location Thoracic   Pain Orientation Right   Pain Descriptors / Indicators Aching;Sore   Pain Type Acute pain;Chronic pain   Pain Radiating Towards none noted    Pain Onset More than a month ago   Pain Frequency Intermittent   Aggravating Factors  prolonged sitting, laying down just gets uncomfortable, overdoing activity    Pain Relieving Factors heat    Effect of Pain on Daily Activities hard  time with regular yoga exercise                         [facility] Adult [facility] Treatment/Exercise - 09/11/15 0001    Shoulder Exercises: Supine   Other Supine Exercises serratus punches with each UE 2x10 reps usign 1# wt   Other Supine Exercises bench press with 4# 1x10   Shoulder Exercises: Seated   Other Seated Exercises 3D thoracic and cervical excursions 1x15    Shoulder Exercises: Stretch   Other Shoulder Stretches upper trap stretch 2x30 sec each   Other Shoulder Stretches flamingo stretch 2 minutes x2 each side   Modalities   Modalities Moist Heat   Moist Heat Therapy   Number Minutes Moist Heat 6 Minutes   Moist Heat Location Other (comment)  thoracic spine    Manual Therapy   Manual Therapy Soft tissue mobilization   Manual therapy comments Performed separately from all other interventions, prone position   Soft tissue mobilization Trigger point release along Rt rhomboids, Rt levator scap                 PT Education - 09/11/15 1825    Education provided Yes   Education Details last session is last approved; can ask  for more or can make it last session next time    Person(s) Educated Patient   Methods Explanation   Comprehension Verbalized understanding          PT Short Term Goals - 08/15/15 1803    PT SHORT TERM GOAL #1   Title Patient to demonstate no to minimal limitation on all planes of Thoracic spine motion in order to improve overall function and posture, reduce pain    Time 2   Period Weeks   Status New   PT SHORT TERM GOAL #2   Title Patient to be able to maintain correct posture during all functional tasks and in all functional positions at least 75% of the time in order to assist in improving mechanics and reducing pain    Time 2   Period Weeks   Status New   PT SHORT TERM GOAL #3   Title Patient to experience pain no more than 4/10 in her thoracic region in order to improve general QOL and functional task performance    Time 2    Period Weeks   Status New   PT SHORT TERM GOAL #4   Title Patient to be independent in correctly and consistently performing appriopriate HEP, to be updated PRN    Time 2   Period Weeks   Status New           PT Long Term Goals - 08/15/15 1805    PT LONG TERM GOAL #1   Title Patient to demonstrate 5/5 strength in all tested muscle groups and will demonstrate no to minimal scapular winging in order to demonstrate improved stability of region and to assist in preventing reoccurrance of injury    Time 4   Period Weeks   Status New   PT LONG TERM GOAL #2   Title Patient to demonstrate no to minimal muscle guarding/tightness and trigger points throughout bilateral shoulder girdle and thoracic spine regions in order to assist in reducing pain and improving posture    Time 4   Period Weeks   Status New   PT LONG TERM GOAL #3   Title Patient to report that she has been experiencing no more than 2/10 pain in her thoracic region in order to improve her sleep patterns and allow her to return to yoga exercises for improved QOL    Time 4   Period Weeks   Status New   PT LONG TERM GOAL #4   Title Patient to report 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 Continued functional mobility and stretching for thoracic and spine in general; did use moist heat today prior to part of functional stretching, heat was not inculded in billing. Finsihed session with soft tisseu work to R side of thoracic spine. No change in pain at end of session  today.    Rehab Potential Excellent   PT Frequency 2x / week   PT  Duration 4 weeks   PT Treatment/Interventions ADLs/Self Care Home Management;Cryotherapy;Biofeedback;Moist Heat;Therapeutic activities;Therapeutic exercise;Neuromuscular re-education;Patient/family education;Manual techniques;Passive range of motion;Taping   PT Next Visit Plan Continue with shoulder stablization therex, serratus, lower trap/rhomboid activation with elevation. manual techniques as appropriate. See if pt would like to continue with PT (need to request more visits) vs DC.    PT Home Exercise Plan updated with supine UE grip and pressdown with alternating SLR; corner stretch and scapular rows with red TB; updated 09/04/15 with upper trap/levator stretch   Consulted and Agree with Plan of Care Patient      Patient will benefit from skilled therapeutic intervention in order to improve the following deficits and impairments:  Hypomobility, Decreased strength, Pain, Increased muscle spasms, Decreased range of motion, Improper body mechanics, Impaired flexibility, Postural dysfunction  Visit Diagnosis: Pain in thoracic spine  Abnormal posture  Cramp and spasm  Muscle weakness (generalized)     Problem List Patient Active Problem List   Diagnosis Date Noted  . Panic attack as reaction to stress 03/17/2013  . Generalized anxiety disorder 03/17/2013  . Sinusitis, chronic 03/09/2013  . Asthma, chronic 03/09/2013  . Benign paroxysmal positional vertigo 03/09/2013    Nedra Hai PT, DPT [phone]  [facility] [address] [place], [place], [address] Phone: [phone]   Fax:  [phone]  Name: Valerie Evans MRN: [id number] Date of Birth: [birth year]

## 2015-09-12 ENCOUNTER — Encounter (HOSPITAL_COMMUNITY): Payer: Self-pay | Admitting: Physical Therapy

## 2015-09-13 ENCOUNTER — Ambulatory Visit (HOSPITAL_COMMUNITY): Payer: PRIVATE HEALTH INSURANCE | Admitting: Physical Therapy

## 2015-09-13 DIAGNOSIS — M546 Pain in thoracic spine: Secondary | ICD-10-CM | POA: Diagnosis not present

## 2015-09-13 DIAGNOSIS — R293 Abnormal posture: Secondary | ICD-10-CM

## 2015-09-13 DIAGNOSIS — M6281 Muscle weakness (generalized): Secondary | ICD-10-CM | POA: Diagnosis not present

## 2015-09-13 DIAGNOSIS — R252 Cramp and spasm: Secondary | ICD-10-CM

## 2015-09-13 DIAGNOSIS — G729 Myopathy, unspecified: Secondary | ICD-10-CM | POA: Diagnosis not present

## 2015-09-13 DIAGNOSIS — S239XXD Sprain of unspecified parts of thorax, subsequent encounter: Secondary | ICD-10-CM | POA: Diagnosis not present

## 2015-09-13 DIAGNOSIS — M2560 Stiffness of unspecified joint, not elsewhere classified: Secondary | ICD-10-CM | POA: Diagnosis not present

## 2015-09-13 NOTE — Therapy (Signed)
[place], [place], [address] Phone: [phone]   Fax:  [phone]  Physical Therapy Discharge  Patient Details  Name: Valerie Evans MRN: [id number] Date of Birth: 1986-02-06 Referring Provider: Odis Luster  Encounter Date: 09/13/2015      PT End of Session - 09/13/15 1524    Visit Number 7   Number of Visits 7   Date for PT Re-Evaluation 09/13/15   Authorization Type [employer] [employer] Work[employer] approved for 6 visits not counting her eval (2 more approved on 4/12)   Authorization Time Period 08/15/15 to 09/15/15   Authorization - Visit Number 6   Authorization - Number of Visits 7   PT Start Time 8333   PT Stop Time 1517   PT Time Calculation (min) 40 min   Activity Tolerance Patient tolerated treatment well   Behavior During Therapy [facility] for tasks assessed/performed      Past Medical History  Diagnosis Date  . Chronic headache   . UTI (lower urinary tract infection)   . Anxiety   . Depression   . Thyroid disease   . Lupus ([place])     No past surgical history on file.  There were no vitals filed for this visit.      Subjective Assessment - 09/13/15 1439    Subjective Pt feels she has improved about 45-50% since she initially began PT. She feels she is doing better but the pain is not completely gone. She is going back to the MD on Monday to discuss progress so far. She says it bothers her more when sitting and laying down than anything else, but she is not waking up anymore due to the pain. She only really notices the pain when someone asks her about it.    Pertinent History hx of BPPV, anxiety, lupus    How long can you sit comfortably? 45 minutes to an hour   Patient Stated Goals go back to full duty at work; sleep better    Currently in Pain? Yes   Pain Score 4    Pain Location Thoracic   Pain Orientation Right   Pain Descriptors / Indicators Aching   Pain Onset More than a month ago             [facility] PT Assessment - 09/13/15 0001    Assessment   Medical Diagnosis thoracic spien sprain    Referring Provider Odis Luster   Onset Date/Surgical Date --  January 2017   Next MD Visit Maylon Peppers in one week    Precautions   Precaution Comments cannot lift more than 20 pounds, no overhead reaching , pushing, pulling    Balance Screen   Has the patient fallen in the past 6 months No   Has the patient had a decrease in activity level because of a fear of falling?  No   Is the patient reluctant to leave their home because of a fear of falling?  No   Prior Function   Level of Independence Independent;Independent with basic ADLs;Independent with gait;Independent with transfers   Leisure yoga    Observation/Other Assessments   Observations forward head rounded shoulders, walking into the clinic with no noted pain/discomfort    Focus on Therapeutic Outcomes (FOTO)  --   Posture/Postural Control   Posture Comments forward head rounded shoulders   AROM   Overall AROM Comments cervical and shoulder ROM WFL    Right Shoulder Internal Rotation --  T2  Right Shoulder External Rotation --  T4   Left Shoulder Internal Rotation --  T2   Left Shoulder External Rotation --  T4   Thoracic Flexion [facility]   Thoracic Extension [facility]   Thoracic - Right Side Bend [facility]    Thoracic - Left Side Bend [facility]   Thoracic - Right Rotation [facility]   Thoracic - Left Rotation [facility]   Strength   Overall Strength Comments lower/middle trap/serratus ant 4+/5   Palpation   Palpation comment non tender to palpation except 1 TrP noted in R distal rhomboids which resolved with manual treatment   Ambulation/Gait   Gait Comments No limitations noted                     [facility] Adult PT Treatment/Exercise - 09/13/15 0001    Exercises   Exercises Other Exercises   Other Exercises  thoracic rotation x5 reps each side   Shoulder Exercises: Standing   Other Standing Exercises Grip, lift and twist 10  reps x5 sec hold   Manual Therapy   Manual Therapy Soft tissue mobilization   Manual therapy comments Performed separately from all other interventions, prone position   Soft tissue mobilization Trigger point release along Rt rhomboids                PT Education - 09/13/15 1524    Education provided Yes   Education Details discussed POC and goals met this period; HEP   Person(s) Educated Patient   Methods Explanation;Demonstration   Comprehension Verbalized understanding;Returned demonstration          PT Short Term Goals - 09/13/15 1452    PT SHORT TERM GOAL #1   Title Patient to demonstate no to minimal limitation on all planes of Thoracic spine motion in order to improve overall function and posture, reduce pain    Baseline --   Time 2   Period Weeks   Status Achieved   PT SHORT TERM GOAL #2   Title Patient to be able to maintain correct posture during all functional tasks and in all functional positions at least 75% of the time in order to assist in improving mechanics and reducing pain    Time 2   Period Weeks   Status Not Met   PT SHORT TERM GOAL #3   Title Patient to experience pain no more than 4/10 in her thoracic region in order to improve general QOL and functional task performance    Baseline Max 6 or 7/10 on days with increased activity, mostly 4 or 5/10, lowest pain report is 0/10 as most days she doesn't notice pain unless someone asks her   Time 2   Period Weeks   Status Partially Met   PT SHORT TERM GOAL #4   Title Patient to be independent in correctly and consistently performing appriopriate HEP, to be updated PRN    Time 2   Period Weeks   Status Achieved           PT Long Term Goals - 09/13/15 1455    PT LONG TERM GOAL #1   Title Patient to demonstrate 5/5 strength in all tested muscle groups and will demonstrate no to minimal scapular winging in order to demonstrate improved stability of region and to assist in preventing reoccurrance  of injury    Baseline 4+/5   Time 4   Period Weeks   Status Partially Met   PT LONG TERM GOAL #2   Title Patient  to demonstrate no to minimal muscle guarding/tightness and trigger points throughout bilateral shoulder girdle and thoracic spine regions in order to assist in reducing pain and improving posture    Baseline 1 mild TrP noted in Rt distal rhomboids, resolved with manual treatment   Time 4   Period Weeks   Status Achieved   PT LONG TERM GOAL #3   Title Patient to report that she has been experiencing no more than 2/10 pain in her thoracic region in order to improve her sleep patterns and allow her to return to yoga exercises for improved QOL    Baseline 4/10 currently   Time 4   Period Weeks   Status Not Met   PT LONG TERM GOAL #4   Title Patient to report she has been able to 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 She feels she has made almost 50% improvement in pain and mobility and is regularly performing her HEP without issues. At this time, she does report pain or "discomfort" in her Rt thoracic region mostly only noted when someone asks her if she is in pain or at the end of a busy day. She responds well to manual treatment with resolution of pain however this is only temporary as she continues to demonstrate poor posture of forward head and rounded shoulders which will continue to result in overuse of her thoracic and periscapular musculature. Her periscapular muscle strength has improved slightly and will continue to improve with HEP adherence. Therapist discussed status of approved visits with pt and she feels she will be  able to manage at home with self trigger point release techniques as well as a adherence to a finalized home management program. Therapist encouraged pt to contact massage therapist for continued management of soft tissue restrictions/trigger points if the pain begins to become severe. Pt is in agreement with discharge at this time due to no further need for skilled PT services and independence with a home management program.    Rehab Potential Excellent   PT Frequency 2x / week   PT Duration 4 weeks   PT Treatment/Interventions ADLs/Self Care Home Management;Cryotherapy;Biofeedback;Moist Heat;Therapeutic activities;Therapeutic exercise;Neuromuscular re-education;Patient/family education;Manual techniques;Passive range of motion;Taping   PT Next Visit Plan Continue with shoulder stablization therex, serratus, lower trap/rhomboid activation with elevation. manual techniques as appropriate. See if pt would like to continue with PT (need to request more visits) vs DC.    PT Home Exercise Plan finalized home managment program: pec stretch, thoracic rotation, scapular rows, shoulder pressdown with SLR, grip lift and twist, posture awareness and self treatment techniques for trigger point release.    Consulted and Agree with Plan of Care Patient      Patient will benefit from skilled therapeutic intervention in order to improve the following deficits and impairments:  Hypomobility, Decreased strength, Pain, Increased muscle spasms, Decreased range of motion, Improper body mechanics, Impaired flexibility, Postural dysfunction  Visit Diagnosis: Pain in thoracic spine  Abnormal posture  Cramp and spasm  Muscle weakness (generalized)     Problem List  Patient Active Problem List   Diagnosis Date Noted  . Panic attack as reaction to stress 03/17/2013  . Generalized anxiety disorder 03/17/2013  . Sinusitis, chronic 03/09/2013  . Asthma, chronic 03/09/2013  . Benign paroxysmal positional vertigo  03/09/2013   PHYSICAL THERAPY DISCHARGE SUMMARY  Visits from Start of Care: 7  Current functional level related to goals / functional outcomes: Pt demonstrates independence with HEP, improved overall pain intensity/frequency, thoracic AROM, shoulder strength and postural awareness.  See clinical impression above for more details.   Remaining deficits: Trigger points and pain along Rt rhomboid, poor sitting posture   Education / Equipment: Advanced home management program Plan: Patient agrees to discharge.  Patient goals were partially met. Patient is being discharged due to financial reasons.  ????? *Ran out of insurance authorization              3:43 PM,09/13/2015 Elly Modena PT, DPT [facility] [address] [place], [place], [address] Phone: [phone]   Fax:  [phone]  Name: Valerie Evans MRN: [id number] Date of Birth: Feb 26, 1986

## 2015-09-21 MED FILL — traZODone HCL 50 MG TABS: 50 | 90 days supply | Qty: 135 | Fill #0

## 2015-10-03 MED FILL — busPIRone HCL 15 MG TABS: 15 | 30 days supply | Qty: 60 | Fill #0

## 2015-10-03 MED FILL — LEVOTHYROXINE 25 MCG TABLET: 25 | 30 days supply | Qty: 30 | Fill #0

## 2015-10-10 DIAGNOSIS — E038 Other specified hypothyroidism: Secondary | ICD-10-CM | POA: Diagnosis not present

## 2015-10-10 DIAGNOSIS — M329 Systemic lupus erythematosus, unspecified: Secondary | ICD-10-CM | POA: Diagnosis not present

## 2015-10-24 DIAGNOSIS — R300 Dysuria: Secondary | ICD-10-CM | POA: Diagnosis not present

## 2015-10-24 DIAGNOSIS — M2550 Pain in unspecified joint: Secondary | ICD-10-CM | POA: Diagnosis not present

## 2015-10-24 DIAGNOSIS — Z79899 Other long term (current) drug therapy: Secondary | ICD-10-CM | POA: Diagnosis not present

## 2015-11-06 MED FILL — METHOCARBAMOL 500 MG TABLET: 500 | 15 days supply | Qty: 60 | Fill #0

## 2015-11-06 MED FILL — LEVOTHYROXINE 25 MCG TABLET: 25 | 30 days supply | Qty: 30 | Fill #1

## 2015-11-07 ENCOUNTER — Other Ambulatory Visit (HOSPITAL_COMMUNITY): Payer: Self-pay | Admitting: Orthopedic Surgery

## 2015-11-07 DIAGNOSIS — M546 Pain in thoracic spine: Secondary | ICD-10-CM

## 2015-11-07 DIAGNOSIS — S239XXA Sprain of unspecified parts of thorax, initial encounter: Secondary | ICD-10-CM

## 2015-11-14 ENCOUNTER — Ambulatory Visit (HOSPITAL_COMMUNITY)
Admission: RE | Admit: 2015-11-14 | Discharge: 2015-11-14 | Disposition: A | Discharge status: Home / Self Care | Payer: PRIVATE HEALTH INSURANCE | Source: Ambulatory Visit | Attending: Orthopedic Surgery | Admitting: Orthopedic Surgery

## 2015-11-14 DIAGNOSIS — M546 Pain in thoracic spine: Secondary | ICD-10-CM | POA: Diagnosis present

## 2015-11-14 MED FILL — DROSPIRENONE-EE 3-0.03 MG T: 3-0.03 | 28 days supply | Qty: 28 | Fill #3

## 2015-11-14 MED FILL — diazePAM 5 MG TABS: 5 | 1 days supply | Qty: 2 | Fill #0

## 2015-11-30 MED FILL — BACLOFEN 10 MG TABLET: 10 | 15 days supply | Qty: 60 | Fill #0

## 2015-11-30 MED FILL — LEVOTHYROXINE 25 MCG TABLET: 25 | 30 days supply | Qty: 30 | Fill #2

## 2015-12-03 ENCOUNTER — Ambulatory Visit: Payer: PRIVATE HEALTH INSURANCE | Attending: Orthopedic Surgery | Admitting: Physical Therapy

## 2015-12-03 DIAGNOSIS — R293 Abnormal posture: Secondary | ICD-10-CM | POA: Diagnosis present

## 2015-12-03 DIAGNOSIS — M546 Pain in thoracic spine: Secondary | ICD-10-CM | POA: Insufficient documentation

## 2015-12-03 NOTE — Therapy (Signed)
[facility] [place] [address]  [address] [place], [place], [address] Phone: [phone]   Fax:  [phone]  Physical Therapy Evaluation  Patient Details  Name: Valerie LuoJoanna M Evans MRN: [id number] Date of Birth: 09/25/1985 Referring Provider: Estill BambergMark Dumonski, MD  Encounter Date: 12/03/2015      PT End of Session - 12/03/15 1810    Visit Number 1   Number of Visits 8   Date for PT Re-Evaluation 01/04/16   Authorization Type [employer] approved for 8 visits not counting her eval    Authorization - Visit Number 1   Authorization - Number of Visits 8   PT Start Time 1658   PT Stop Time 1802   PT Time Calculation (min) 64 min   Activity Tolerance Patient tolerated treatment well   Behavior [facility] Therapy [facility] for tasks assessed/performed      Past Medical History  Diagnosis Date  . Chronic headache   . UTI (lower urinary tract infection)   . Anxiety   . Depression   . Thyroid disease   . Lupus (HCC)     No past surgical history on file.  There were no vitals filed for this visit.       Subjective Assessment - 12/03/15 1703    Subjective Pt worked as a Charity fundraiserN at [employer] and was injured at work while attempting to prevent an obese pt from falling back in January 2017. [facility] x 7 visits in March & April 2017 with some improvment noted but continues to have R thoracic pain along inferior-medial scapula. Pain is worst with lying or sitting and better with standing. 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   Pertinent History hx of BPPV, anxiety, lupus    Diagnostic tests 11/14/15: negative thoracic spine MRI   Patient Stated Goals "Not to be in so much pain all the time; sleep better"   Currently in Pain? Yes   Pain Score --  6-7/10 currently; Least 2/10, Avg 4-5/10, Worst 8/10   Pain Location Thoracic   Pain Orientation Right   Pain Type Acute pain   Pain Radiating Towards none noted   Pain Onset More than a month ago   Pain Frequency Constant  but varies in intensity   Aggravating Factors  reaching up, carrying things, prolonged sitting or laying, excessive activity (busy days at work)   Pain Relieving Factors lidocaine patches, heat, limited relief from Advil   Effect of Pain on Daily Activities difficulty with lifting and reaching, interferes with sleep         Today's Treatment  Manual STM/TPR to distal rhomboids and suscapularis with manual stretching to the same  TherEx Rhomboid stretch 2x30" - seated and doorway Doorway Pec stretch x30" B Shoulder Horizontal ABD + Scapular retraction - demonstrated in standing against FR/pool noodle on wall and with back along doorframe  Modalities IFC (80-150 Hz) to R thoracic region in L sidelying (pt preference) - intensity to pt tolerance x10'          PT Education - 12/03/15 1826    Education provided Yes   Education Details PT eval findings, POC, role of TDN & TENS, and initial HEP   Person(s) Educated Patient   Methods Explanation;Demonstration;Handout   Comprehension Verbalized understanding;Returned demonstration;Need further instruction          PT Short Term  Goals - 12/03/15 1827    PT SHORT TERM GOAL #1   Title Independent with initial HEP by 12/14/15   Status New           PT Long Term Goals - 12/03/15 1829    PT LONG TERM GOAL #1   Title Independent with advanced HEP as indicated by 01/04/16   Status New   PT LONG TERM GOAL #2   Title Pt to report no greater than 2/10 pain in thoracic region on average for improved comfort while sitting or sleeping by 01/04/16   Status New   PT LONG TERM GOAL #3   Title Pt will report ability to reach overhead and lift up to 20# w/o limitation due to thoracic pain by 01/04/16   Status New               Plan - 12/03/15 1817    Clinical Impression Statement Valerie Evans is a 30 y/o female  who is returning to OP PT for continued R sided thoracic spine pain stemming from an initial injury which occurred in January 2017 when she was trying to keep a combative patient from falling at her job as a Charity fundraiser at [employer]. Pt previously received PT x 7 visits in March & April of this year with some improvement noted, but pain has persisted and continues to impact all aspects of daily activities especially where reaching overhead or lifting is required, with pt also reporting pain limiting sleep. Pain at time of eval was 6-7/10 in R lower thoracic spine and inferior-medial scapular border; with least pain reported as 2/10, 4-5/10 on average, and up to 8/10 at worst. Patient very thin in general, ribs and rib heads, other boney structures very easily palpated today; with ttp noted as above. Assessment reveals forward head and rounded shoulder posture with thoracic ROM WFL except mild limitation in L rotation. POC will focus on improving LE soft tissue pliability along with core/scapular stability training, postural training with emphasis on neutral spine alignment, and manual therapy and modalities including TENS, [place] +/- [place] PRN for pain. May consider dry needling as pt has previously responded well to TPR.   Rehab Potential Good   Clinical Impairments Affecting Rehab Potential chronicity of pain   PT Frequency 2x / week   PT Duration 4 weeks   PT Treatment/Interventions Patient/family education;Neuromuscular re-education;Therapeutic exercise;Manual techniques;Dry needling;Taping;Electrical Stimulation;Moist Heat;Ultrasound;Cryotherapy;Iontophoresis 4mg /ml Dexamethasone;ADLs/Self Care Home Management   PT Next Visit Plan Assess response to TENS; Review new additions to HEP and prior HEP for contined appropraiteness; Shoulder/scapular stablization therex, serratus, lower trap/rhomboid activation with elevation; Manual techniques & modaliites as appropriate   Consulted and Agree with Plan of Care  Patient      Patient will benefit from skilled therapeutic intervention in order to improve the following deficits and impairments:  Hypomobility, Decreased strength, Pain, Increased muscle spasms, Decreased range of motion, Improper body mechanics, Impaired flexibility, Postural dysfunction, Impaired UE functional use  Visit Diagnosis: Pain in thoracic spine  Abnormal posture     Problem List Patient Active Problem List   Diagnosis Date Noted  . Panic attack as reaction to stress 03/17/2013  . Generalized anxiety disorder 03/17/2013  . Sinusitis, chronic 03/09/2013  . Asthma, chronic 03/09/2013  . Benign paroxysmal positional vertigo 03/09/2013    Marry Guan, PT, MPT 12/04/2015, 1:07 PM  [facility] [address]  [address] [place], [place], [address] Phone: [phone]  Fax:  [phone]  Name: YULENI BURICH MRN: [id number] Date of Birth: 1985-06-26

## 2015-12-06 ENCOUNTER — Ambulatory Visit: Payer: PRIVATE HEALTH INSURANCE

## 2015-12-06 DIAGNOSIS — M546 Pain in thoracic spine: Secondary | ICD-10-CM

## 2015-12-06 NOTE — Therapy (Signed)
[facility] Out[facility] [address]  [address] [place], [place], [address] Phone: [phone]   Fax:  [phone]  Physical Therapy Treatment  Patient Details  Name: Valerie Evans MRN: [id number] Date of Birth: Jan 12, 1986 Referring Provider: Estill Bamberg, MD  Encounter Date: 12/06/2015      PT End of Session - 12/06/15 1708    Visit Number 2   Number of Visits 8   Date for PT Re-Evaluation 01/04/16   Authorization Type [employer] [employer] approved for 8 visits not counting her eval    Authorization - Visit Number 2   Authorization - Number of Visits 8   PT Start Time 1704   PT Stop Time 1750   PT Time Calculation (min) 46 min   Activity Tolerance Patient tolerated treatment well   Behavior During Therapy [facility] for tasks assessed/performed      Past Medical History  Diagnosis Date  . Chronic headache   . UTI (lower urinary tract infection)   . Anxiety   . Depression   . Thyroid disease   . Lupus (HCC)     History reviewed. No pertinent past surgical history.  There were no vitals filed for this visit.      Subjective Assessment - 12/06/15 1707    Subjective Pt. reports R-sided thoracic mid back pain 6/10 initially today.  No other pain or complaints reported.     Patient Stated Goals "Not to be in so much pain all the time; sleep better"   Currently in Pain? Yes   Pain Score 6    Pain Location Thoracic   Pain Orientation Right   Pain Type Acute pain   Pain Onset More than a month ago   Pain Frequency Constant   Multiple Pain Sites No       Today's Treatment:  Therex: UBE: 2 min forward / backward, 1.0 level Supine scapular retraction 5" x 10 reps Doorway stretch x 30 sec  Rhomboid stretch 2x30"  Manual: R medial scapular border TPR  R medial scapular border (1000Gr) med ball pressure rolls on wall  R medial scapular border (1000Gr) med ball pressure rolls in supine  Therex: Standing scapular  retraction with green TB x 10 reps   Modalities IFC (80-150 Hz) to R thoracic region in L sidelying (pt preference) - intensity to pt tolerance x10'          PT Short Term Goals - 12/06/15 1733    PT SHORT TERM GOAL #1   Title Independent with initial HEP by 12/14/15   Status On-going           PT Long Term Goals - 12/06/15 1733    PT LONG TERM GOAL #1   Title Independent with advanced HEP as indicated by 01/04/16   Status On-going   PT LONG TERM GOAL #2   Title Pt to report no greater than 2/10 pain in thoracic region on average for improved comfort while sitting or sleeping by 01/04/16   Status On-going   PT LONG TERM GOAL #3   Title Pt will report ability to reach overhead and lift up to 20# w/o limitation due to thoracic pain by 01/04/16   Status On-going               Plan - 12/06/15 1712    Clinical Impression Statement Pt. reports R-sided thoracic mid back pain 6/10 initially today.  No other pain or complaints reported.  Pt. reported  that she had been inconsistent with HEP since evaluation.  Pt. able to recall some of the HEP accurately however would benefit from continued HEP review in future treatments.  Pt. tolerated all conservative scapular strengthening and anterior chest stretching well.  Pt. reports she felt benefit from E-stim. with pain relief.     PT Treatment/Interventions Patient/family education;Neuromuscular re-education;Therapeutic exercise;Manual techniques;Dry needling;Taping;Electrical Stimulation;Moist Heat;Ultrasound;Cryotherapy;Iontophoresis 4mg /ml Dexamethasone;ADLs/Self Care Home Management   PT Next Visit Plan Shoulder/scapular stablization therex, serratus, lower trap/rhomboid activation with elevation; Manual techniques & modaliites as appropriate      Patient will benefit from skilled therapeutic intervention in order to improve the following deficits and impairments:  Hypomobility, Decreased strength, Pain, Increased muscle spasms,  Decreased range of motion, Improper body mechanics, Impaired flexibility, Postural dysfunction, Impaired UE functional use  Visit Diagnosis: Pain in thoracic spine     Problem List Patient Active Problem List   Diagnosis Date Noted  . Panic attack as reaction to stress 03/17/2013  . Generalized anxiety disorder 03/17/2013  . Sinusitis, chronic 03/09/2013  . Asthma, chronic 03/09/2013  . Benign paroxysmal positional vertigo 03/09/2013    Kermit BaloMicah Laylanie Kruczek, PTA 12/06/2015, 6:24 PM  [facility] [place] [address]  [address] [place], [place], [address] Phone: [phone]   Fax:  [phone]  Name: Valerie Evans MRN: [id number] Date of Birth: 06/28/1985

## 2015-12-07 DIAGNOSIS — F331 Major depressive disorder, recurrent, moderate: Secondary | ICD-10-CM | POA: Diagnosis not present

## 2015-12-07 MED FILL — ALPRAZolam 0.5 MG TABS: 0.5 | 76 days supply | Qty: 305 | Fill #0

## 2015-12-10 ENCOUNTER — Ambulatory Visit: Payer: PRIVATE HEALTH INSURANCE | Admitting: Physical Therapy

## 2015-12-10 DIAGNOSIS — R293 Abnormal posture: Secondary | ICD-10-CM

## 2015-12-10 DIAGNOSIS — M546 Pain in thoracic spine: Secondary | ICD-10-CM | POA: Diagnosis not present

## 2015-12-10 MED FILL — YASMIN 28 TABLET: 3-0.03 | 84 days supply | Qty: 84 | Fill #0

## 2015-12-10 NOTE — Therapy (Signed)
[facility] Out[facility] [address]  [address] [place], [place], [address] Phone: [phone]   Fax:  [phone]  Physical Therapy Treatment  Patient Details  Name: Valerie Evans MRN: [id number] Date of Birth: January 21, 1986 Referring Provider: Estill Bamberg, MD  Encounter Date: 12/10/2015      PT End of Session - 12/10/15 1705    Visit Number 3   Number of Visits 8   Date for PT Re-Evaluation 01/04/16   Authorization Type [employer] approved for 8 visits    Authorization - Visit Number 3   Authorization - Number of Visits 8   PT Start Time 1705   PT Stop Time 1807   PT Time Calculation (min) 62 min   Activity Tolerance Patient tolerated treatment well   Behavior During Therapy [employer] for tasks assessed/performed      Past Medical History  Diagnosis Date  . Chronic headache   . UTI (lower urinary tract infection)   . Anxiety   . Depression   . Thyroid disease   . Lupus (HCC)     No past surgical history on file.  There were no vitals filed for this visit.      Subjective Assessment - 12/10/15 1721    Subjective Pt reports TENS electrode position at last visit was uncomfortable, but otherwise without complaints or concerns.   Patient Stated Goals "Not to be in so much pain all the time; sleep better"   Currently in Pain? Yes   Pain Score --  5-6/10   Pain Location Thoracic   Pain Orientation Right          Today's Treatment  TherEx UBE - lvl 1.5 x fwd/back x 2 min each Standing Shoulder Row with green TB x15 (green TB provided for home) Standing Scapular retraction + shoulder extension to neutral with green TB x10 (pt noting very difficult with green TB, therefore instructed to perform with red TB at home)  Manual R medial scapular border STM & TPR in L sidelying R scapular mobs in L sidelying   TherEx Open book stretch 3x15" R shoulder ABD to 90 dg with elbow flexed x10 focusing on scapular  movement POE Serratus push-up 10x2" (pt noting increased LB discomfort) BATCA Chest press Plus 10# x15  Manual R medial scapular border (1000Gr) med ball pressure rolls on wall   Modalities IFC (80-150 Hz) to R thoracic/scapular region in L sidelying (pt preference) - intensity to pt tolerance x15'  Kinesiotape - 2 roughly parallel "I" strips (50%) horizontally extending to B lateral scapulae with slight upward curve on superior strip & slight downward curve on inferior strip (scapular winging pattern)          PT Short Term Goals - 12/06/15 1733    PT SHORT TERM GOAL #1   Title Independent with initial HEP by 12/14/15   Status On-going           PT Long Term Goals - 12/06/15 1733    PT LONG TERM GOAL #1   Title Independent with advanced HEP as indicated by 01/04/16   Status On-going   PT LONG TERM GOAL #2   Title Pt to report no greater than 2/10 pain in thoracic region on average for improved comfort while sitting or sleeping by 01/04/16   Status On-going   PT LONG TERM GOAL #3   Title Pt will report ability to reach overhead and lift up to 20# w/o limitation due  to thoracic pain by 01/04/16   Status On-going               Plan - 12/10/15 1807    Clinical Impression Statement Pt reports pain slightly less today at 5-6/10 and continues to note benefit from estim, although did not like elecrode placement last visit. Pt has been approved for home TENS unit by worker's comp, but awaiting MD order for WC to obtain TENS unit. Pt remains very weak with limited tolerance for scapular strengthening. Introduced trial of kinesiotaping to reduce scapular winging and pain in R medial thoracic/scapular region and will assess response at next visit.   PT Treatment/Interventions Patient/family education;Neuromuscular re-education;Therapeutic exercise;Manual techniques;Dry needling;Taping;Electrical Stimulation;Moist Heat;Ultrasound;Cryotherapy;Iontophoresis 4mg /ml  Dexamethasone;ADLs/Self Care Home Management   PT Next Visit Plan Assess response to taping; Shoulder/scapular stablization therex, serratus, lower trap/rhomboid activation with elevation; Manual techniques & modaliites as appropriate   Consulted and Agree with Plan of Care Patient      Patient will benefit from skilled therapeutic intervention in order to improve the following deficits and impairments:  Hypomobility, Decreased strength, Pain, Increased muscle spasms, Decreased range of motion, Improper body mechanics, Impaired flexibility, Postural dysfunction, Impaired UE functional use  Visit Diagnosis: Pain in thoracic spine  Abnormal posture     Problem List Patient Active Problem List   Diagnosis Date Noted  . Panic attack as reaction to stress 03/17/2013  . Generalized anxiety disorder 03/17/2013  . Sinusitis, chronic 03/09/2013  . Asthma, chronic 03/09/2013  . Benign paroxysmal positional vertigo 03/09/2013    Marry GuanJoAnne M Olamide Lahaie, PT, MPT 12/10/2015, 6:29 PM  [facility] [place] [address]  [address] [place], [place], [address] Phone: [phone]   Fax:  [phone]  Name: Dwyane LuoJoanna M Warn MRN: [id number] Date of Birth: 02/21/1986

## 2015-12-12 ENCOUNTER — Ambulatory Visit: Payer: PRIVATE HEALTH INSURANCE

## 2015-12-12 DIAGNOSIS — M546 Pain in thoracic spine: Secondary | ICD-10-CM

## 2015-12-12 DIAGNOSIS — R293 Abnormal posture: Secondary | ICD-10-CM

## 2015-12-12 NOTE — Therapy (Signed)
[facility] [place] [address]  [address] [place], [place], [address] Phone: [phone]   Fax:  [phone]  Physical Therapy Treatment  Patient Details  Name: [name] MRN: [id number] Date of Birth: 03/23/1986 Referring Provider: Estill BambergMark Dumonski, MD  Encounter Date: 12/12/2015      PT End of Session - 12/12/15 1809    Visit Number 4   Number of Visits 8   Date for PT Re-Evaluation 01/04/16   Authorization Type [employer] approved for 8 visits    Authorization - Visit Number 4   Authorization - Number of Visits 8   PT Start Time 1705   PT Stop Time 1759   PT Time Calculation (min) 54 min   Activity Tolerance Patient tolerated treatment well   Behavior During Therapy [facility] for tasks assessed/performed      Past Medical History  Diagnosis Date  . Chronic headache   . UTI (lower urinary tract infection)   . Anxiety   . Depression   . Thyroid disease   . Lupus (HCC)     History reviewed. No pertinent past surgical history.  There were no vitals filed for this visit.      Subjective Assessment - 12/12/15 1807    Subjective Pt. reports taping still intact to scapular area however would like reapplication of tape due to her being gone on vacation all of next week.  Pt. with 4/10 thoracic pain initially today.     Patient Stated Goals "Not to be in so much pain all the time; sleep better"   Currently in Pain? Yes   Pain Score 4    Pain Location Thoracic   Pain Orientation Right   Pain Descriptors / Indicators Aching   Pain Type Acute pain   Pain Onset More than a month ago   Multiple Pain Sites No      Today's Treatment  TherEx: UBE - lvl 2.0 x fwd/back x 2 min each Standing Shoulder Row with green TB x15  Standing Scapular retraction + shoulder extension to neutral with green TB x 15  Prone I's, T's, and Y's x 10 reps each POE Serratus scapular brace x 30 sec  BATCA Chest press Plus 10#  x15 BATCA pull down machine 15# x 15 reps  Modalities: IFC (80-150 Hz) to R thoracic/scapular region in L sidelying (pt preference) - intensity to pt tolerance x10'  Self-care management: Kinesiotape - 2 roughly parallel "I" strips (50%) horizontally extending to B lateral scapulae with slight upward curve on superior strip & slight downward curve on inferior strip (scapular winging pattern)          PT Short Term Goals - 12/12/15 1810    PT SHORT TERM GOAL #1   Title Independent with initial HEP by 12/14/15   Status Achieved           PT Long Term Goals - 12/06/15 1733    PT LONG TERM GOAL #1   Title Independent with advanced HEP as indicated by 01/04/16   Status On-going   PT LONG TERM GOAL #2   Title Pt to report no greater than 2/10 pain in thoracic region on average for improved comfort while sitting or sleeping by 01/04/16   Status On-going   PT LONG TERM GOAL #3   Title Pt will report ability to reach overhead and lift up to 20# w/o limitation due to thoracic pain by 01/04/16   Status On-going  Plan - 12/12/15 1810    Clinical Impression Statement Pt. reports taping still intact to scapular area however would like reapplication of tape due to her being gone on vacation all of next week.  Pt. with 4/10 thoracic pain initially today.  Pt. tolerated all scapular strengthening today well with focus on retraction strengthening activities; BATCA machine pulldown added today with pt. tolerating well.  Taping reapplied to scapular region today.  Pt. to be gone on vacation all next week and return for next treatment on 7/31.     PT Treatment/Interventions Patient/family education;Neuromuscular re-education;Therapeutic exercise;Manual techniques;Dry needling;Taping;Electrical Stimulation;Moist Heat;Ultrasound;Cryotherapy;Iontophoresis /ml Dexamethasone;ADLs/Self Care Home Management   PT Next Visit Plan Assess response to taping #2; Shoulder/scapular  stablization therex, serratus, lower trap/rhomboid activation with elevation; Manual techniques & modaliites as appropriate      Patient will benefit from skilled therapeutic intervention in order to improve the following deficits and impairments:  Hypomobility, Decreased strength, Pain, Increased muscle spasms, Decreased range of motion, Improper body mechanics, Impaired flexibility, Postural dysfunction, Impaired UE functional use  Visit Diagnosis: Pain in thoracic spine  Abnormal posture     Problem List Patient Active Problem List   Diagnosis Date Noted  . Panic attack as reaction to stress 03/17/2013  . Generalized anxiety disorder 03/17/2013  . Sinusitis, chronic 03/09/2013  . Asthma, chronic 03/09/2013  . Benign paroxysmal positional vertigo 03/09/2013    Kermit Balo, PTA 12/12/2015, 6:16 PM  [facility] [address]  [address] [place], [place], [address] Phone: [phone]   Fax:  [phone]  Name: Valerie Evans MRN: [id number] Date of Birth: [birth year]

## 2015-12-13 ENCOUNTER — Ambulatory Visit: Payer: PRIVATE HEALTH INSURANCE | Admitting: Physical Therapy

## 2015-12-17 ENCOUNTER — Encounter: Payer: Self-pay | Admitting: Physical Therapy

## 2015-12-24 ENCOUNTER — Ambulatory Visit: Payer: PRIVATE HEALTH INSURANCE

## 2015-12-24 DIAGNOSIS — R293 Abnormal posture: Secondary | ICD-10-CM

## 2015-12-24 DIAGNOSIS — M546 Pain in thoracic spine: Secondary | ICD-10-CM

## 2015-12-24 NOTE — Therapy (Signed)
[facility] [address]  [address] [place], [place], [address] Phone: [phone]   Fax:  [phone]  Physical Therapy Treatment  Patient Details  Name: Valerie Evans MRN: [id number] Date of Birth: 14-Jan-1986 Referring Provider: Estill Bamberg, MD  Encounter Date: 12/24/2015      PT End of Session - 12/24/15 1807    Visit Number 5   Number of Visits 8   Date for PT Re-Evaluation 01/04/16   Authorization Type [employer] approved for 8 visits    Authorization - Visit Number 5   Authorization - Number of Visits 8   PT Start Time 1707   PT Stop Time 1808   PT Time Calculation (min) 61 min   Activity Tolerance Patient tolerated treatment well   Behavior During Therapy [facility] for tasks assessed/performed      Past Medical History:  Diagnosis Date  . Anxiety   . Chronic headache   . Depression   . Lupus (HCC)   . Thyroid disease   . UTI (lower urinary tract infection)     No past surgical history on file.  There were no vitals filed for this visit.      Subjective Assessment - 12/24/15 1715    Subjective Pt. reports a 5/10 initial R sided mid back pain initially today and reports the taping stayed intact through the weekend following last treatment.     Patient Stated Goals "Not to be in so much pain all the time; sleep better"   Currently in Pain? Yes   Pain Score 5    Pain Location Thoracic   Pain Orientation Right   Pain Descriptors / Indicators Aching   Pain Type Acute pain   Pain Onset More than a month ago   Pain Frequency Constant   Multiple Pain Sites No      Today's Treatment:  Therex: UBE: level 1.5, 2 min each way  Manual: Prone R medial scapular border STM/TPR  STM to R UT  Therex: Laying on 1/2 foam bolster:         B horizontal shoulder abduction with green TB x 15 reps          Alternating flexion/ext. with green TB x 10 each way  Leaning next to doorseal on half  white bolster:           B shoulder ER with yellow TB x 15 reps           B shoulder horizontal abduction with green TB (waist height) x 15 reps  BATCA low row 10# x 15 reps  BATCA pulldown 10# x 15 reps  Prone over green (65cm) p-ball:         I's x 10 reps         T's x 10 reps          Y's x 10 reps           Modalities: IFC (80-150 Hz) to R thoracic/scapular region in L sidelying (pt preference) - intensity to pt tolerance x 12'         PT Short Term Goals - 12/12/15 1810      PT SHORT TERM GOAL #1   Title Independent with initial HEP by 12/14/15   Status Achieved           PT Long Term Goals - 12/06/15 1733      PT LONG TERM GOAL #1   Title Independent  with advanced HEP as indicated by 01/04/16   Status On-going     PT LONG TERM GOAL #2   Title Pt to report no greater than 2/10 pain in thoracic region on average for improved comfort while sitting or sleeping by 01/04/16   Status On-going     PT LONG TERM GOAL #3   Title Pt will report ability to reach overhead and lift up to 20# w/o limitation due to thoracic pain by 01/04/16   Status On-going               Plan - 12/24/15 1813    Clinical Impression Statement Pt. reports a 5/10 initial R sided mid back pain initially today and reports the taping stayed intact through the weekend following last treatment; pt. reported some skin irritation following removal of taping however requested reapplication today.  Taping deferred till next treatment due to skin irritation.  Pt. tolerated scapular strengthening and chest stretching today well however reported nearly unchanged 5/10 thoracic pain throughout therex.  STM/TPR performed to R sided medial scap. Border with pt. tolerating well.  Moist heat / E-stim applied following therex with only slight decrease in pain reported.  Pt. to see PT next treatment   PT Treatment/Interventions Patient/family education;Neuromuscular re-education;Therapeutic exercise;Manual  techniques;Dry needling;Taping;Electrical Stimulation;Moist Heat;Ultrasound;Cryotherapy;Iontophoresis 4mg /ml Dexamethasone;ADLs/Self Care Home Management   PT Next Visit Plan Assess skin response to taping #2; Shoulder/scapular stablization therex, serratus, lower trap/rhomboid activation with elevation; Manual techniques & modaliites as appropriate      Patient will benefit from skilled therapeutic intervention in order to improve the following deficits and impairments:  Hypomobility, Decreased strength, Pain, Increased muscle spasms, Decreased range of motion, Improper body mechanics, Impaired flexibility, Postural dysfunction, Impaired UE functional use  Visit Diagnosis: Pain in thoracic spine  Abnormal posture     Problem List Patient Active Problem List   Diagnosis Date Noted  . Panic attack as reaction to stress 03/17/2013  . Generalized anxiety disorder 03/17/2013  . Sinusitis, chronic 03/09/2013  . Asthma, chronic 03/09/2013  . Benign paroxysmal positional vertigo 03/09/2013    Kermit Balo, PTA 12/24/2015, 6:18 PM  [facility] [address]  [address] [place], [place], [address] Phone: [phone]   Fax:  [phone]  Name: GERANE DURNIL MRN: [id number] Date of Birth: May 16, 1986

## 2015-12-27 ENCOUNTER — Ambulatory Visit: Payer: PRIVATE HEALTH INSURANCE | Attending: Orthopedic Surgery | Admitting: Physical Therapy

## 2015-12-27 DIAGNOSIS — R293 Abnormal posture: Secondary | ICD-10-CM | POA: Insufficient documentation

## 2015-12-27 DIAGNOSIS — M546 Pain in thoracic spine: Secondary | ICD-10-CM | POA: Diagnosis present

## 2015-12-27 NOTE — Therapy (Signed)
[facility] [address]  [place], [place], [address] Phone: [phone]   Fax:  [phone]  Physical Therapy Treatment  Patient Details  Name: Valerie Evans MRN: [id number] Date of Birth: 09-17-1985 Referring Provider: Phylliss Bob, MD  Encounter Date: 12/27/2015      PT End of Session - 12/27/15 1700    Visit Number 6   Number of Visits 8   Date for PT Re-Evaluation 01/04/16   Authorization Type [employer] approved for 8 visits    Authorization - Visit Number 5   Authorization - Number of Visits 8   PT Start Time 1700   PT Stop Time 1807   PT Time Calculation (min) 67 min   Activity Tolerance Patient tolerated treatment well   Behavior During Therapy [facility] for tasks assessed/performed      Past Medical History:  Diagnosis Date  . Anxiety   . Chronic headache   . Depression   . Lupus ([place])   . Thyroid disease   . UTI (lower urinary tract infection)     No past surgical history on file.  There were no vitals filed for this visit.      Subjective Assessment - 12/27/15 1704    Subjective Pt noting increased soreness after last vist but unable to identify what might have been the trigger. Pt noting overall improvement with therapy, with sleeping improving but still limited by pain.   Patient Stated Goals "Not to be in so much pain all the time; sleep better"   Currently in Pain? Yes   Pain Score 7    Pain Location Thoracic   Pain Orientation Right   Pain Descriptors / Indicators Jabbing;Nagging;Tender;Throbbing   Pain Type Chronic pain            [facility] PT Assessment - 12/27/15 1700      Assessment   Medical Diagnosis Thoracic pain   Referring Provider Phylliss Bob, MD   Onset Date/Surgical Date --  January 2017   Next MD Visit 12/28/15     AROM   Overall AROM Comments cervical and shoulder ROM [facility]    Thoracic Flexion [facility]   Thoracic Extension [place]   Thoracic - Right  [place] [facility]   Thoracic - Left [place] [facility]   Thoracic - Right Rotation [facility]   Thoracic - Left Rotation [facility]           Today's Treatment  TherEx UBE - level 1.5, fwd/back x 2 min  Reviewed options for self STM/TRP at home:    TheraCane    4" ball on wall Prone over corner of mat table:    I's x 10 reps    T's x 10 reps     W's x 7 reps (stopped to c/o increase "whole back" pain)       Y's (not attempted due to pain w/ "W's")  3 way prayer stretch x30" each Serratus roll-up with orange (55 cm) Pball on wall x10 Leaning back against doorframe opposite open door:     B shoulder ER with yellow TB x15 15 reps     B shoulder horizontal abduction with red TB x15   Manual Prone R medial scapular border STM/TPR   Kinesiotape - 2 roughly parallel "I" strips (50%) horizontally extending to B lateral scapulae with slight upward curve on superior strip & slight downward curve on inferior strip (scapular winging pattern)  Modalities: IFC (80-150 Hz) to R thoracic/scapular region in prone - intensity to pt tolerance x 15' Moist heat to thoracic spine in prone in conjunction with estim x15'            PT Education - 12/27/15 1754    Education provided Yes   Education Details Reviewed options for self STM/TRP at home: TheraCane or 4" ball (available at [employer] or like store)   Person(s) Educated Patient   Methods Explanation;Demonstration   Comprehension Verbalized understanding;Returned demonstration          PT Short Term Goals - 12/12/15 1810      PT SHORT TERM GOAL #1   Title Independent with initial HEP by 12/14/15   Status Achieved           PT Long Term Goals - 12/27/15 1713      PT LONG TERM GOAL #1   Title Independent with advanced HEP as indicated by 01/04/16   Status On-going     PT LONG TERM GOAL #2   Title Pt to report no greater than 2/10 pain in thoracic region on average for improved comfort while sitting or sleeping by 01/04/16    Status On-going     PT LONG TERM GOAL #3   Title Pt will report ability to reach overhead and lift up to 20# w/o limitation due to thoracic pain by 01/04/16   Status On-going               Plan - 12/27/15 1800    Clinical Impression Statement Pt reporting improvement with PT and wants to continue, although only 2 visits remaining of 8 visits approved by worker's comp. Despite pt's subjective report of improvement including improving sleeping tolerance (although pt still noting sleep disturbance due to pain), pt's pain ratings have remained essentially unchanged since the 6/10 reported on eval, with recent ratings averaging 5-7/10 with only 1 visit where pain was rated at 4/10. Pt reporting she like the strengthening progression but notes increased pain after last visit (unable to identify triggering activity) and demonstrated limited tolerance from prone scapular strengthening today. Pt reports she continues to avoid lifting any object she thinks might hurt. STG for independence with HEP met, but no LTG's met at this time. Pt notes benefit from taping but taping was deferred at last visit due to irritation after being in sun while tape in place; skin appears normal today, therefore taping reapplied. Pt also noting relief from estim and heat and has been approved by worker's comp for a home TENS unit, but has yet to receive the unit.   PT Treatment/Interventions Patient/family education;Neuromuscular re-education;Therapeutic exercise;Manual techniques;Dry needling;Taping;Electrical Stimulation;Moist Heat;Ultrasound;Cryotherapy;Iontophoresis 32m/ml Dexamethasone;ADLs/Self Care Home Management   PT Next Visit Plan Shoulder/scapular stablization therex, serratus, lower trap/rhomboid activation with elevation; Manual techniques & modaliites as appropriate      Patient will benefit from skilled therapeutic intervention in order to improve the following deficits and impairments:  Hypomobility,  Decreased strength, Pain, Increased muscle spasms, Decreased range of motion, Improper body mechanics, Impaired flexibility, Postural dysfunction, Impaired UE functional use  Visit Diagnosis: Pain in thoracic spine  Abnormal posture     Problem List Patient Active Problem List   Diagnosis Date Noted  . Panic attack as reaction to stress 03/17/2013  . Generalized anxiety disorder 03/17/2013  . Sinusitis, chronic 03/09/2013  . Asthma, chronic 03/09/2013  . Benign paroxysmal positional vertigo 03/09/2013    JPercival Spanish PT, MPT 12/27/2015, 6:32 PM  [facility] [address]  [place], [place], [address] Phone: [phone]   Fax:  [phone]  Name: TALULA ISLAND MRN: [id number] Date of Birth: 1986/03/05

## 2015-12-28 MED FILL — BACLOFEN 10 MG TABLET: 10 | 15 days supply | Qty: 60 | Fill #0

## 2015-12-28 MED FILL — MELOXICAM 15 MG TABLET: 15 | 30 days supply | Qty: 30 | Fill #0

## 2015-12-31 ENCOUNTER — Ambulatory Visit: Payer: PRIVATE HEALTH INSURANCE

## 2016-01-01 MED FILL — HYDROXYCHLOROQUINE 200 MG T: 200 | 90 days supply | Qty: 45 | Fill #0

## 2016-01-03 ENCOUNTER — Ambulatory Visit: Payer: PRIVATE HEALTH INSURANCE | Admitting: Physical Therapy

## 2016-01-03 DIAGNOSIS — M546 Pain in thoracic spine: Secondary | ICD-10-CM | POA: Diagnosis not present

## 2016-01-03 DIAGNOSIS — R293 Abnormal posture: Secondary | ICD-10-CM

## 2016-01-03 MED FILL — RESTASIS MULTIDOSE 0.05% EY: 0.05 | 55 days supply | Qty: 6 | Fill #0

## 2016-01-03 NOTE — Therapy (Signed)
[facility] [address]  [address] [place], [place], [address] Phone: [phone]   Fax:  [phone]  Physical Therapy Treatment  Patient Details  Name: Valerie LuoJoanna M Emami MRN: [id number] Date of Birth: 09/18/1985 Referring Provider: Estill BambergMark Dumonski, MD  Encounter Date: 01/03/2016      PT End of Session - 01/03/16 1705    Visit Number 7   Number of Visits 8   Date for PT Re-Evaluation 01/04/16   Authorization Type [employer] [employer] approved for 8 visits    Authorization - Visit Number 7   Authorization - Number of Visits 8   PT Start Time 1705   PT Stop Time 1812   PT Time Calculation (min) 67 min   Activity Tolerance Patient tolerated treatment well   Behavior During Therapy [facility] for tasks assessed/performed      Past Medical History:  Diagnosis Date  . Anxiety   . Chronic headache   . Depression   . Lupus (HCC)   . Thyroid disease   . UTI (lower urinary tract infection)     No past surgical history on file.  There were no vitals filed for this visit.      Subjective Assessment - 01/03/16 1711    Subjective Pt saw MD last Friday and states WC case manager working on having additional therapy visits approved per MD recommendation. Unable to come to therapy appt earlier this week due to Lupus flare.   Patient Stated Goals "Not to be in so much pain all the time; sleep better"   Currently in Pain? Yes   Pain Score 6    Pain Location Thoracic   Pain Orientation Right           Today's Treatment  TherEx Prone over corner of mat table: I's x 10 T's x 10     W's x10 Y's x10 Prone Alt UE/LE lifts x10 3 way prayer stretch x30" each Supine B Serratus punch 3# x10 Supine B Scapular protraction + Shoulder circles CW/CCW 3# x10 Serratus roll-up with orange (55 cm) Pball on wall x10 BATCA Chest press Plus 10# x15 BATCA Pull down 10# x15  Manual Prone R medial scapular border  STM/TPR   Kinesiotape - 2 roughly parallel "I" strips (50%) horizontally extending to B lateral scapulae with slight upward curve on superior strip & slight downward curve on inferior strip (scapular winging pattern)  Modalities Instructed pt in set-up and use of home TENS unit TENS (modulated) to R thoracic/scapular region in prone - intensity to pt tolerance x 15' Moist heat to thoracic spine in prone in conjunction with estim x15'           PT Education - 01/03/16 1800    Education provided Yes   Education Details TENS unit set-up & use   Person(s) Educated Patient   Methods Explanation;Demonstration   Comprehension Verbalized understanding;Returned demonstration          PT Short Term Goals - 12/12/15 1810      PT SHORT TERM GOAL #1   Title Independent with initial HEP by 12/14/15   Status Achieved           PT Long Term Goals - 12/27/15 1713      PT LONG TERM GOAL #1   Title Independent with advanced HEP as indicated by 01/04/16   Status On-going     PT LONG TERM GOAL #2   Title Pt to report no greater  than 2/10 pain in thoracic region on average for improved comfort while sitting or sleeping by 01/04/16   Status On-going     PT LONG TERM GOAL #3   Title Pt will report ability to reach overhead and lift up to 20# w/o limitation due to thoracic pain by 01/04/16   Status On-going               Plan - 01/03/16 1800    Clinical Impression Statement Pt missed last appt due to flare-up on Lupus but would like to reschedule and is waiting to hear from worker's comp case manager to see if she will be approved for further visits. If no further visits approved will plan for final review/update of HEP at next visit.   PT Treatment/Interventions Patient/family education;Neuromuscular re-education;Therapeutic exercise;Manual techniques;Dry needling;Taping;Electrical Stimulation;Moist Heat;Ultrasound;Cryotherapy;Iontophoresis /ml Dexamethasone;ADLs/Self Care  Home Management   PT Next Visit Plan Discharge vs recert per worker's comp approval; If D/C, review/update HEP as indicated   Consulted and Agree with Plan of Care Patient      Patient will benefit from skilled therapeutic intervention in order to improve the following deficits and impairments:  Hypomobility, Decreased strength, Pain, Increased muscle spasms, Decreased range of motion, Improper body mechanics, Impaired flexibility, Postural dysfunction, Impaired UE functional use  Visit Diagnosis: Pain in thoracic spine  Abnormal posture     Problem List Patient Active Problem List   Diagnosis Date Noted  . Panic attack as reaction to stress 03/17/2013  . Generalized anxiety disorder 03/17/2013  . Sinusitis, chronic 03/09/2013  . Asthma, chronic 03/09/2013  . Benign paroxysmal positional vertigo 03/09/2013    Marry Guan, PT, MPT 01/03/2016, 6:34 PM  [facility] [address]  [address] [place], [place], [address] Phone: [phone]   Fax:  [phone]  Name: EUSEBIA GRULKE MRN: [id number] Date of Birth: 1985-06-10

## 2016-01-03 NOTE — Patient Instructions (Signed)
TENS UNIT  TENS unit instructions:   Do not shower or bathe with the unit on  Turn the unit off before removing electrodes or batteries  If the electrodes lose stickiness add a drop of water to the electrodes after they are disconnected from the unit and place on plastic sheet. If you continued to have difficulty, call the TENS unit company to purchase more electrodes.  Do not apply lotion on the skin area prior to use. Make sure the skin is clean and dry as this will help prolong the life of the electrodes.  After use, always check skin for unusual red areas, rash or other skin difficulties. If there are any skin problems, does not apply electrodes to the same area.  Never remove the electrodes from the unit by pulling the wires.  Do not use the TENS unit or electrodes other than as directed.  Do not change electrode placement without consulting your therapist or physician.  Keep 2 fingers with between each electrode.

## 2016-01-08 MED FILL — traZODone HCL 50 MG TABS: 50 | 30 days supply | Qty: 45 | Fill #0

## 2016-01-08 MED FILL — LEVOTHYROXINE 25 MCG TABLET: 25 | 30 days supply | Qty: 30 | Fill #3

## 2016-01-21 ENCOUNTER — Ambulatory Visit: Payer: PRIVATE HEALTH INSURANCE

## 2016-01-21 DIAGNOSIS — M546 Pain in thoracic spine: Secondary | ICD-10-CM | POA: Diagnosis not present

## 2016-01-21 DIAGNOSIS — R293 Abnormal posture: Secondary | ICD-10-CM

## 2016-01-21 NOTE — Addendum Note (Signed)
Addended by: Marry GuanKREIS, JOANNE M on: 01/21/2016 06:34 PM   Modules accepted: Orders

## 2016-01-21 NOTE — Therapy (Addendum)
[facility] [address]  [address] [place], [place], [address] Phone: [phone]   Fax:  [phone]  Physical Therapy Treatment  Patient Details  Name: Valerie Evans MRN: [id number] Date of Birth: 01/04/1986 Referring Provider: Estill BambergMark Dumonski, MD  Encounter Date: 01/21/2016      PT End of Session - 01/21/16 1726    Visit Number 8   Number of Visits 16   Date for PT Re-Evaluation 02/22/16   Authorization Type [employer] [employer] Work[employer] approved for additional 8 visits    Authorization Time Period 01/22/16 - 02/21/16   Authorization - Visit Number 8   Authorization - Number of Visits 16   PT Start Time 1705   PT Stop Time 1745   PT Time Calculation (min) 40 min   Activity Tolerance Patient tolerated treatment well   Behavior During Therapy [facility] for tasks assessed/performed      Past Medical History:  Diagnosis Date  . Anxiety   . Chronic headache   . Depression   . Lupus (HCC)   . Thyroid disease   . UTI (lower urinary tract infection)     No past surgical history on file.  There were no vitals filed for this visit.      Subjective Assessment - 01/21/16 1717    Subjective Pt. reports the amount of pain and overall frequency has been less over the last week.     Patient Stated Goals "Not to be in so much pain all the time; sleep better"   Currently in Pain? Yes   Pain Score 4    Pain Location Thoracic   Pain Orientation Right   Pain Descriptors / Indicators Jabbing;Nagging;Tender   Pain Onset More than a month ago   Pain Frequency Constant   Multiple Pain Sites No        Today's Treatment:  TherEx: UBE: level 1.0, 4 min each way  Prone over corner of mat table:   I's x 10 reps    T's x 10 reps    W's x10 reps  Rhomboids stretch 2 x 30 sec  Standing scapular retraction with green TB x 15 reps; pain increase following this  BATCA Chest press Plus 15# x 15 BATCA Pull down 15# x 15    Manual L sidelying R medial scapular border STM/TPR  L UT STM   Kinesiotape - 2 roughly parallel "I" strips (50%) horizontally extending to B lateral scapulae with slight upward curve on superior strip & slight downward curve on inferior strip (scapular winging pattern)         PT Short Term Goals - 12/12/15 1810      PT SHORT TERM GOAL #1   Title Independent with initial HEP by 12/14/15   Status Achieved           PT Long Term Goals - 01/21/16 1820      PT LONG TERM GOAL #1   Title Independent with advanced HEP as indicated by 02/22/16   Status On-going     PT LONG TERM GOAL #2   Title Pt to report no greater than 2/10 pain in thoracic region on average for improved comfort while sitting or sleeping by 02/22/16   Status On-going     PT LONG TERM GOAL #3   Title Pt will report ability to reach overhead and lift up to 20# w/o limitation due to thoracic pain by 02/21/16   Status On-going  Plan - 01/21/16 1728    Clinical Impression Statement PT session attended by worker's comp case manager. Pt. still with 4/10 initial R scapular pain today.  MD extended therapy and workers comp is allowing 8 more visits at a frequency of 2x/wk.  Pt. reported pain increase following single set of scapular retraction and machine resistant scapular protraction today.  Pt. unable to progress in resistance today due to pain limitation.  Pt. continues to report benefit from scapular taping thus reapplied tape today by female PT.  TPR/STM to R medial scap. border continued today with some relief noted.  Pt. with limited progress at this point.     PT Treatment/Interventions Patient/family education;Neuromuscular re-education;Therapeutic exercise;Manual techniques;Dry needling;Taping;Electrical Stimulation;Moist Heat;Ultrasound;Cryotherapy;Iontophoresis 4mg /ml Dexamethasone;ADLs/Self Care Home Management   PT Next Visit Plan Review/update HEP as indicated; progress scapular  stability activities as tolerated.        Patient will benefit from skilled therapeutic intervention in order to improve the following deficits and impairments:  Hypomobility, Decreased strength, Pain, Increased muscle spasms, Decreased range of motion, Improper body mechanics, Impaired flexibility, Postural dysfunction, Impaired UE functional use  Visit Diagnosis: Pain in thoracic spine  Abnormal posture     Problem List Patient Active Problem List   Diagnosis Date Noted  . Panic attack as reaction to stress 03/17/2013  . Generalized anxiety disorder 03/17/2013  . Sinusitis, chronic 03/09/2013  . Asthma, chronic 03/09/2013  . Benign paroxysmal positional vertigo 03/09/2013    Kermit Balo, PTA 01/21/2016, 6:13 PM  [facility] [address]  [address] [place], [place], [address] Phone: [phone]   Fax:  [phone]  Name: Valerie Evans MRN: [id number] Date of Birth: [birth year]

## 2016-01-22 ENCOUNTER — Ambulatory Visit: Payer: PRIVATE HEALTH INSURANCE | Admitting: Physical Therapy

## 2016-01-22 DIAGNOSIS — M546 Pain in thoracic spine: Secondary | ICD-10-CM

## 2016-01-22 DIAGNOSIS — R293 Abnormal posture: Secondary | ICD-10-CM

## 2016-01-22 NOTE — Therapy (Signed)
[facility] [address]  [address] [place], [place], [address] Phone: [phone]   Fax:  [phone]  Physical Therapy Treatment  Patient Details  Name: Valerie Evans MRN: [id number] Date of Birth: 01/24/1986 Referring Provider: Estill BambergMark Dumonski, MD  Encounter Date: 01/22/2016      PT End of Session - 01/22/16 1615    Visit Number 9   Number of Visits 16   Date for PT Re-Evaluation 02/22/16   Authorization Type [employer] [employer] approved for additional 8 visits as of 01/22/16   Authorization Time Period 01/22/16 - 02/21/16   Authorization - Visit Number 9   Authorization - Number of Visits 16   PT Start Time 1615   PT Stop Time 1657   PT Time Calculation (min) 42 min   Activity Tolerance Patient tolerated treatment well   Behavior During Therapy [facility] for tasks assessed/performed      Past Medical History:  Diagnosis Date  . Anxiety   . Chronic headache   . Depression   . Lupus (HCC)   . Thyroid disease   . UTI (lower urinary tract infection)     No past surgical history on file.  There were no vitals filed for this visit.      Subjective Assessment - 01/22/16 1615    Subjective Pt reports more pain today, but states she thinks it is due to her Lupus.   Patient Stated Goals "Not to be in so much pain all the time; sleep better"   Currently in Pain? Yes   Pain Score --  3-4/10   Pain Location Thoracic   Pain Orientation Right   Pain Onset More than a month ago           Today's Treatment  TherEx UBE - level 1.5 fwd/back x 4' each   Manual R middle trap/rhomboid STM in prone Grade 1-2 A/P T3-T7 mobs (emphasis on R)   TherEx POE Serratus push-up 10x3" Prone Alt UE/LE lifts x10 Bird Dog - Quadruped over peanut ball x10 R UE D2 flexion with yellow TB x10 (c/o clicking in shoulder) R UE D1 extension with yellow TB x10 (increased R medial thoracic soreness) R Single arm row + slight  trunk rotation with red TB x10 Serratus roll-up on orange (55 cm) Pball on wall with yellow TB loop around forearms x10 Wall push-up on orange (55 cm) PBall x10 TRX Low Row x10           PT Short Term Goals - 12/12/15 1810      PT SHORT TERM GOAL #1   Title Independent with initial HEP by 12/14/15   Status Achieved           PT Long Term Goals - 01/21/16 1820      PT LONG TERM GOAL #1   Title Independent with advanced HEP as indicated by 02/22/16   Status On-going     PT LONG TERM GOAL #2   Title Pt to report no greater than 2/10 pain in thoracic region on average for improved comfort while sitting or sleeping by 02/22/16   Status On-going     PT LONG TERM GOAL #3   Title Pt will report ability to reach overhead and lift up to 20# w/o limitation due to thoracic pain by 02/21/16   Status On-going               Plan - 01/22/16 1657    Clinical  Impression Statement Pt noting more global soreness today due to Lupus, but states thoracic pain still constant at 3-4/10. Continued focus on scapular stabilization adding some diagonal and rotational movement patterns, with only discomfort noted with diagonal patterns.   PT Treatment/Interventions Patient/family education;Neuromuscular re-education;Therapeutic exercise;Manual techniques;Dry needling;Taping;Electrical Stimulation;Moist Heat;Ultrasound;Cryotherapy;Iontophoresis 4mg /ml Dexamethasone;ADLs/Self Care Home Management   PT Next Visit Plan Review/update HEP as indicated; progress scapular stability activities as tolerated.        Patient will benefit from skilled therapeutic intervention in order to improve the following deficits and impairments:  Hypomobility, Decreased strength, Pain, Increased muscle spasms, Decreased range of motion, Improper body mechanics, Impaired flexibility, Postural dysfunction, Impaired UE functional use  Visit Diagnosis: Pain in thoracic spine  Abnormal posture     Problem  List Patient Active Problem List   Diagnosis Date Noted  . Panic attack as reaction to stress 03/17/2013  . Generalized anxiety disorder 03/17/2013  . Sinusitis, chronic 03/09/2013  . Asthma, chronic 03/09/2013  . Benign paroxysmal positional vertigo 03/09/2013    Marry Guan, PT, MPT 01/22/2016, 5:09 PM  [facility] [address]  [address] [place], [place], [address] Phone: [phone]   Fax:  [phone]  Name: Valerie Evans MRN: [id number] Date of Birth: [birth year]

## 2016-01-29 ENCOUNTER — Ambulatory Visit: Payer: PRIVATE HEALTH INSURANCE | Attending: Orthopedic Surgery | Admitting: Physical Therapy

## 2016-01-29 DIAGNOSIS — M6281 Muscle weakness (generalized): Secondary | ICD-10-CM | POA: Diagnosis present

## 2016-01-29 DIAGNOSIS — R293 Abnormal posture: Secondary | ICD-10-CM | POA: Insufficient documentation

## 2016-01-29 DIAGNOSIS — M546 Pain in thoracic spine: Secondary | ICD-10-CM | POA: Insufficient documentation

## 2016-01-29 DIAGNOSIS — R252 Cramp and spasm: Secondary | ICD-10-CM | POA: Insufficient documentation

## 2016-01-29 NOTE — Therapy (Signed)
[facility] [place] [address]  [place], [place], [address] Phone: [phone]   Fax:  [phone]  Physical Therapy Treatment  Patient Details  Name: Valerie Evans MRN: [id number] Date of Birth: 01-21-1986 Referring Provider: Phylliss Bob, MD  Encounter Date: 01/29/2016      PT End of Session - 01/29/16 1706    Visit Number 10   Number of Visits 16   Date for PT Re-Evaluation 02/22/16   Authorization Type [employer] [employer] approved for additional 8 visits as of 01/22/16   Authorization Time Period 01/22/16 - 02/21/16   Authorization - Visit Number 10   Authorization - Number of Visits 16   PT Start Time 1706   PT Stop Time 3329   PT Time Calculation (min) 43 min   Activity Tolerance Patient tolerated treatment well   Behavior During Therapy [facility] for tasks assessed/performed      Past Medical History:  Diagnosis Date  . Anxiety   . Chronic headache   . Depression   . Lupus ([place])   . Thyroid disease   . UTI (lower urinary tract infection)     No past surgical history on file.  There were no vitals filed for this visit.      Subjective Assessment - 01/29/16 1706    Subjective Pt reports pain is less constant, more intermittent than it used to be.   Patient Stated Goals "Not to be in so much pain all the time; sleep better"   Currently in Pain? Yes   Pain Score 4    Pain Location Thoracic   Pain Orientation Right   Pain Onset More than a month ago            [facility] PT Assessment - 01/29/16 1706      Observation/Other Assessments   Focus on Therapeutic Outcomes (FOTO)  Thoracic spine 49% (51% limitation)     ROM / Strength   AROM / PROM / Strength Strength     Strength   Strength Assessment Site Shoulder   Right/Left Shoulder Right;Left   Right Shoulder Flexion 4/5   Right Shoulder ABduction 4/5   Right Shoulder Internal Rotation 4/5   Right Shoulder External Rotation 4/5   Left  Shoulder Flexion 4/5   Left Shoulder ABduction 4/5   Left Shoulder Horizontal ABduction 4/5   Left Shoulder Horizontal ADduction 4/5          Today's Treatment  TherEx UBE - level 2.5 fwd/back x 3' each  10# db lift from floor to counter, counter to 1st shelf in cabinet, and counter to 2nd shelf in cabinet (pain/discomfort noted only with highest shelf) TRX Low Row x10 TRX Mid Row x10 "W" Row with red TB x10 Prone over edge of mat table:    Serratus rock/wt shift on inverted BOSU x10    Serratus push-up on inverted BOSU x10 Quadruped Bird Dog x10 BATCA Pull down15# x15   Kinesiotape - 2 parallel "I" strips (30%) extending vertically along thoracic paraspinals + perpendicular "I" strip (30-50%) at level of greatest pain          PT Short Term Goals - 12/12/15 1810      PT SHORT TERM GOAL #1   Title Independent with initial HEP by 12/14/15   Status Achieved           PT Long Term Goals - 01/29/16 1720      PT LONG TERM GOAL #1  Title Independent with advanced HEP as indicated by 02/22/16   Status Partially Met  Met for current HEP     PT LONG TERM GOAL #2   Title Pt to report no greater than 2/10 pain in thoracic region on average for improved comfort while sitting or sleeping by 02/22/16   Status On-going  Worst pain currently 4/10     PT LONG TERM GOAL #3   Title Pt will report ability to reach overhead and lift up to 20# w/o limitation due to thoracic pain by 02/21/16   Status On-going  Able to lift 10# dumbell to lowest shelf w/o discomfort, but increased thoracic pain when lifting 10# to 2nd shelf               Plan - 01/29/16 1749    Clinical Impression Statement Pt reporting thoracic pain is now less constant, more intermittent, and intensity usually 4/10 or less. Progressed scapular stabilization with more partial body weight resistance activities and decreasing support/stability of surfaces with pt noting more discomfort with rowing  activities away from body. Taping pattern changed today due minor irritation present from last taping, and encouraged pt to remove tape when wet from shower or otherwise apply lotion/oil to decrease adhesive grip.   PT Treatment/Interventions Patient/family education;Neuromuscular re-education;Therapeutic exercise;Manual techniques;Dry needling;Taping;Electrical Stimulation;Moist Heat;Ultrasound;Cryotherapy;Iontophoresis 28m/ml Dexamethasone;ADLs/Self Care Home Management   PT Next Visit Plan Assess reponse to new taping pattern; Review/update HEP as indicated; progress scapular stability activities as tolerated.        Patient will benefit from skilled therapeutic intervention in order to improve the following deficits and impairments:  Hypomobility, Decreased strength, Pain, Increased muscle spasms, Decreased range of motion, Improper body mechanics, Impaired flexibility, Postural dysfunction, Impaired UE functional use  Visit Diagnosis: Pain in thoracic spine  Abnormal posture     Problem List Patient Active Problem List   Diagnosis Date Noted  . Panic attack as reaction to stress 03/17/2013  . Generalized anxiety disorder 03/17/2013  . Sinusitis, chronic 03/09/2013  . Asthma, chronic 03/09/2013  . Benign paroxysmal positional vertigo 03/09/2013    JPercival Spanish PT, MPT 01/29/2016, 6:23 PM  [facility] [place] [address]Phone: [phone]  Fax:  [phone] Name: Valerie TIBBETTMRN: [id number] of Birth: [birth year]

## 2016-01-31 ENCOUNTER — Ambulatory Visit: Payer: PRIVATE HEALTH INSURANCE

## 2016-02-05 ENCOUNTER — Ambulatory Visit: Payer: PRIVATE HEALTH INSURANCE

## 2016-02-06 ENCOUNTER — Ambulatory Visit: Payer: PRIVATE HEALTH INSURANCE

## 2016-02-06 DIAGNOSIS — R293 Abnormal posture: Secondary | ICD-10-CM

## 2016-02-06 DIAGNOSIS — M546 Pain in thoracic spine: Secondary | ICD-10-CM

## 2016-02-06 NOTE — Therapy (Signed)
[facility] [place] [address]  [place], [place], [address] Phone: [phone]   Fax:  [phone]  Physical Therapy Treatment  Patient Details  Name: Valerie Evans MRN: [id number] Date of Birth: December 19, 1985 Referring Provider: Phylliss Bob, MD  Encounter Date: 02/06/2016      PT End of Session - 02/06/16 1705    Visit Number 11   Number of Visits 16   Date for PT Re-Evaluation 02/22/16   Authorization Type [facility] [employer] approved for additional 8 visits as of 01/22/16   Authorization Time Period 01/22/16 - 02/21/16   Authorization - Visit Number 11   Authorization - Number of Visits 16   PT Start Time 1701   PT Stop Time 5631   PT Time Calculation (min) 42 min   Activity Tolerance Patient tolerated treatment well   Behavior During Therapy [facility] for tasks assessed/performed      Past Medical History:  Diagnosis Date  . Anxiety   . Chronic headache   . Depression   . Lupus ([place])   . Thyroid disease   . UTI (lower urinary tract infection)     No past surgical history on file.  There were no vitals filed for this visit.      Subjective Assessment - 02/06/16 1704    Subjective Pt. continues to report pain is less constant, more intermittent than it used to be.    Patient Stated Goals "Not to be in so much pain all the time; sleep better"   Currently in Pain? Yes   Pain Score 4    Pain Location Thoracic   Pain Orientation Right   Pain Descriptors / Indicators Aching;Dull   Pain Type Chronic pain   Pain Onset More than a month ago   Pain Frequency Intermittent   Multiple Pain Sites No        Today's Treatment  TherEx UBE - level 2.5 fwd/back x 2'each  TRX Low Row x 15 reps  TRX Mid Row x 10 reps  "W" Row with red TB x 10 reps  Quadruped Bird Dog x 15 reps each side   Manual:  STM to R thoracic paraspinals; pt. pain free following this  Therex: BATCA Pull down15# x15 BATCA low  row 15# x 7 reps; terminated due to rise in pain Standing scapular retraction with green TB x 15 reps  Standing scapular retraction/ext. with red TB x 10 reps  Kinesiotape: 2 roughly parallel "I" strips (50%) horizontally extending to B lateral scapulae with slight upward curve on superior strip & slight downward curve on inferior strip (scapular winging pattern)       [facility] PT Assessment - 02/06/16 1718      Assessment   Medical Diagnosis Thoracic pain   Referring Provider Phylliss Bob, MD   Next MD Visit 03/03/16             PT Education - 02/06/16 1747    Education provided Yes   Person(s) Educated Patient   Methods Explanation;Demonstration   Comprehension Verbalized understanding;Returned demonstration          PT Short Term Goals - 12/12/15 1810      PT SHORT TERM GOAL #1   Title Independent with initial HEP by 12/14/15   Status Achieved           PT Long Term Goals - 01/29/16 1720      PT LONG TERM GOAL #1   Title Independent  with advanced HEP as indicated by 02/22/16   Status Partially Met  Met for current HEP     PT LONG TERM GOAL #2   Title Pt to report no greater than 2/10 pain in thoracic region on average for improved comfort while sitting or sleeping by 02/22/16   Status On-going  Worst pain currently 4/10     PT LONG TERM GOAL #3   Title Pt will report ability to reach overhead and lift up to 20# w/o limitation due to thoracic pain by 02/21/16   Status On-going  Able to lift 10# dumbell to lowest shelf w/o discomfort, but increased thoracic pain when lifting 10# to 2nd shelf               Plan - 02/06/16 1706    Clinical Impression Statement Pt. with 4/10 initial R-sided mid back pain reporting less frequent and less intense pain than before again today.  Pt. mid back pain remained manageable throughout therex today.  Today's treatment continued focus on moderate intensity scapular strengthening activity.  Pt. seems to have a slower  onset of mid back pain able to tolerate increased strengthening activity before onset.  Pt. reporting continued benefit from taping to mid back thus pattern #1 scapular taping reapplied again today.  Scapular retraction, and retraction/extension with bands added to HEP today and issued to pt.  Pt. reporting already performing these at home thus pt. instructed to perform as directed on handout and report tolerance to therapist next treatment.      PT Treatment/Interventions Patient/family education;Neuromuscular re-education;Therapeutic exercise;Manual techniques;Dry needling;Taping;Electrical Stimulation;Moist Heat;Ultrasound;Cryotherapy;Iontophoresis 48m/ml Dexamethasone;ADLs/Self Care Home Management   PT Next Visit Plan Review/update HEP as indicated; progress scapular stability activities as tolerated.        Patient will benefit from skilled therapeutic intervention in order to improve the following deficits and impairments:  Hypomobility, Decreased strength, Pain, Increased muscle spasms, Decreased range of motion, Improper body mechanics, Impaired flexibility, Postural dysfunction, Impaired UE functional use  Visit Diagnosis: Pain in thoracic spine  Abnormal posture     Problem List Patient Active Problem List   Diagnosis Date Noted  . Panic attack as reaction to stress 03/17/2013  . Generalized anxiety disorder 03/17/2013  . Sinusitis, chronic 03/09/2013  . Asthma, chronic 03/09/2013  . Benign paroxysmal positional vertigo 03/09/2013    MBess Harvest PTA 02/06/2016, 6:14 PM  [facility] [place] [address]Phone: [phone]  Fax:  [phone] Name: JRAUSHANAH OSMUNDSONMRN: [id number] of Birth: [birth year]

## 2016-02-07 ENCOUNTER — Ambulatory Visit: Payer: PRIVATE HEALTH INSURANCE

## 2016-02-11 MED FILL — LEVOTHYROXINE 25 MCG TABLET: 25 | 30 days supply | Qty: 30 | Fill #4

## 2016-02-12 ENCOUNTER — Ambulatory Visit: Payer: PRIVATE HEALTH INSURANCE | Admitting: Physical Therapy

## 2016-02-12 DIAGNOSIS — R293 Abnormal posture: Secondary | ICD-10-CM

## 2016-02-12 DIAGNOSIS — R252 Cramp and spasm: Secondary | ICD-10-CM

## 2016-02-12 DIAGNOSIS — M546 Pain in thoracic spine: Secondary | ICD-10-CM | POA: Diagnosis not present

## 2016-02-12 DIAGNOSIS — M6281 Muscle weakness (generalized): Secondary | ICD-10-CM

## 2016-02-12 MED FILL — traZODone HCL 50 MG TABS: 50 | 30 days supply | Qty: 45 | Fill #0

## 2016-02-12 NOTE — Therapy (Signed)
[facility] [place] [address]  [place], [place], [address] Phone: [phone]   Fax:  [phone]  Physical Therapy Treatment  Patient Details  Name: Valerie Evans MRN: [id number] Date of Birth: 1985/09/09 Referring Provider: Phylliss Bob, MD  Encounter Date: 02/12/2016      PT End of Session - 02/12/16 1700    Visit Number 12   Number of Visits 16   Date for PT Re-Evaluation 02/22/16   Authorization Type [facility] [employer] approved for additional 8 visits as of 01/22/16   Authorization Time Period 01/22/16 - 02/21/16   Authorization - Visit Number 12   Authorization - Number of Visits 16   PT Start Time 1700   PT Stop Time 1746   PT Time Calculation (min) 46 min   Activity Tolerance Patient tolerated treatment well   Behavior During Therapy [facility] for tasks assessed/performed      Past Medical History:  Diagnosis Date  . Anxiety   . Chronic headache   . Depression   . Lupus ([place])   . Thyroid disease   . UTI (lower urinary tract infection)     No past surgical history on file.  There were no vitals filed for this visit.      Subjective Assessment - 02/12/16 1700    Subjective Pt stating pain is a little less today and remains mostly intermittent recently.   Patient Stated Goals "Not to be in so much pain all the time; sleep better"   Currently in Pain? Yes   Pain Score 3    Pain Location Thoracic   Pain Orientation Right   Pain Descriptors / Indicators Dull;Aching   Pain Type Chronic pain           Today's Treatment  TherEx UBE - level 2.5 fwd/back x 2'each  Serratus rock/wt shift in plank position from knees on inverted BOSU x10 Prone over edge of mat table:    Serratus push-up on inverted BOSU x10    Fitter Side-to-Side B Shoulder Horiz ABD/ADD with emphasis on serratus activation  x10    Fitter B Shoulder Flexion/Extension with emphasis on serratus activation x10 TRX Low Row  x15 TRX Mid Row x10 Quadruped Bird Dog x15  B Single UE upward row + slight trunk rotation with green TB x10  Manual Prone Rmiddle trap/rhomboid & medial scapular border STM/TPR  Grade 1-2 A/P T3-T7 mobs (emphasis on R)   Kinesiotape - 2 parallel "I" strips (30%) extending vertically along thoracic paraspinals + perpendicular "I" strip (30-50%) at level of greatest pain           PT Short Term Goals - 12/12/15 1810      PT SHORT TERM GOAL #1   Title Independent with initial HEP by 12/14/15   Status Achieved           PT Long Term Goals - 01/29/16 1720      PT LONG TERM GOAL #1   Title Independent with advanced HEP as indicated by 02/22/16   Status Partially Met  Met for current HEP     PT LONG TERM GOAL #2   Title Pt to report no greater than 2/10 pain in thoracic region on average for improved comfort while sitting or sleeping by 02/22/16   Status On-going  Worst pain currently 4/10     PT LONG TERM GOAL #3   Title Pt will report ability to reach overhead and lift up to  20# w/o limitation due to thoracic pain by 02/21/16   Status On-going  Able to lift 10# dumbell to lowest shelf w/o discomfort, but increased thoracic pain when lifting 10# to 2nd shelf               Plan - 02/12/16 1746    Clinical Impression Statement Overall pain levels very slowly declining (3/10 today) with frequency of pain becoming less constant. Continued hypomobility noted in thoracic vertebrae, therefore targeted joint mobility and STM in this area in addition to scapular strengthening/stabilization. Pt nearing end date of POC but 4 visits remaining of visits approved by [facility] - will check with WC if ok to recert to use remaining visits vs proceed with D/C.   PT Treatment/Interventions Patient/family education;Neuromuscular re-education;Therapeutic exercise;Manual techniques;Dry needling;Taping;Electrical Stimulation;Moist Heat;Ultrasound;Cryotherapy;Iontophoresis 64m/ml  Dexamethasone;ADLs/Self Care Home Management   PT Next Visit Plan Review/update HEP as indicated; progress scapular stability activities as tolerated.        Patient will benefit from skilled therapeutic intervention in order to improve the following deficits and impairments:  Hypomobility, Decreased strength, Pain, Increased muscle spasms, Decreased range of motion, Improper body mechanics, Impaired flexibility, Postural dysfunction, Impaired UE functional use  Visit Diagnosis: Pain in thoracic spine  Abnormal posture  Cramp and spasm  Muscle weakness (generalized)     Problem List Patient Active Problem List   Diagnosis Date Noted  . Panic attack as reaction to stress 03/17/2013  . Generalized anxiety disorder 03/17/2013  . Sinusitis, chronic 03/09/2013  . Asthma, chronic 03/09/2013  . Benign paroxysmal positional vertigo 03/09/2013    JPercival Spanish PT, MPT 02/12/2016, 6:50 PM  [facility] [place] [address]Phone: [phone]  Fax:  [phone] Name: Valerie HAPPELMRN: [id number] of Birth: 9July 26, 1987

## 2016-02-18 ENCOUNTER — Ambulatory Visit: Payer: PRIVATE HEALTH INSURANCE

## 2016-02-19 ENCOUNTER — Ambulatory Visit: Payer: PRIVATE HEALTH INSURANCE

## 2016-02-19 DIAGNOSIS — M546 Pain in thoracic spine: Secondary | ICD-10-CM

## 2016-02-19 DIAGNOSIS — R293 Abnormal posture: Secondary | ICD-10-CM

## 2016-02-19 NOTE — Therapy (Signed)
[facility] [address]  [place], [place], [address] Phone: [phone]   Fax:  [phone]  Physical Therapy Treatment  Patient Details  Name: Valerie Evans MRN: [id number] Date of Birth: Mar 12, 1986 Referring Provider: Phylliss Bob, MD  Encounter Date: 02/19/2016      PT End of Session - 02/19/16 1637    Visit Number 13   Number of Visits 16   Date for PT Re-Evaluation 02/22/16   Authorization Type [employer] [employer]'s [employer] approved for additional 8 visits as of 01/22/16   Authorization Time Period 01/22/16 - 02/21/16   Authorization - Visit Number 13   Authorization - Number of Visits 16   PT Start Time 3710   PT Stop Time 1500   PT Time Calculation (min) 40 min   Activity Tolerance Patient tolerated treatment well   Behavior During Therapy [facility] for tasks assessed/performed      Past Medical History:  Diagnosis Date  . Anxiety   . Chronic headache   . Depression   . Lupus ([employer])   . Thyroid disease   . UTI (lower urinary tract infection)     No past surgical history on file.  There were no vitals filed for this visit.      Subjective Assessment - 02/19/16 1631    Subjective Pt. reporting she irritated her mid back today at work however has been feeling better before this.    Patient Stated Goals "Not to be in so much pain all the time; sleep better"   Currently in Pain? Yes   Pain Score 5    Pain Location Thoracic   Pain Orientation Right   Pain Descriptors / Indicators Dull;Aching   Pain Type Chronic pain   Pain Onset More than a month ago   Pain Frequency Intermittent   Multiple Pain Sites No      Today's treatment:  Therex: UBE: level 1.0, 3 min forwards/backwards Partial pushup on peanut p-ball hanging off table x 10 reps  Partial pushup on peanut p-ball hanging off table with blue TB around elbows x 10 reps  Reaching putting 1# dumbbell toward 2nd shelf x 5 reps each side   Reaching putting 2# dumbbell toward 2nd shelf x 5 reps each side  Rhomboids stretch x 30 sec  Prone alternating UE/LE raise x 10 reps  Quadruped alternating UE/LE raise on peanut p-ball x 10 reps   Manual:  R thoracic paraspinal STM over injured area   * Pt. reporting a 5/10 pain in R thoracic paraspinal following treatment        PT Short Term Goals - 12/12/15 1810      PT SHORT TERM GOAL #1   Title Independent with initial HEP by 12/14/15   Status Achieved           PT Long Term Goals - 02/19/16 1655      PT LONG TERM GOAL #1   Title Independent with advanced HEP as indicated by 02/22/16   Status Partially Met  Met for current HEP     PT LONG TERM GOAL #2   Title Pt to report no greater than 2/10 pain in thoracic region on average for improved comfort while sitting or sleeping by 02/22/16   Status On-going  02/19/16: Worst pain currently 4/10     PT LONG TERM GOAL #3   Title Pt will report ability to reach overhead and lift up to 20# w/o limitation due to  thoracic pain by 02/21/16   Status On-going  02/19/16: Able to lift 10# dumbell to lowest shelf w/o discomfort, but increased thoracic pain when lifting 10# to 2nd shelf               Plan - 02/19/16 1704    Clinical Impression Statement  Pt. reporting skin irritation from previous taping to mid back thus taping not reapplied today.  Pt. with initial 5/10 R-sided thoracic pain which increased slightly with therex today.  Pt. reporting she has had a pain increase with today's work however that she feels her mid back pain has been decreased over this past week.  Today's treatment focused on thoracic extension and scapular strengthening activity to pt. tolerance.  Pt. still with limited tolerance for strengthening activity at this point secondary to R-sided thoracic pain.  Pt. still reporting pain increase at work while lifting objects overhead and average pain of 3-4/10 while working each day.  Verified with  caseworker pt. authorized to complete remaining approved visits in POC.   PT Treatment/Interventions Patient/family education;Neuromuscular re-education;Therapeutic exercise;Manual techniques;Dry needling;Taping;Electrical Stimulation;Moist Heat;Ultrasound;Cryotherapy;Iontophoresis 70m/ml Dexamethasone;ADLs/Self Care Home Management   PT Next Visit Plan Review/update HEP as indicated; progress scapular stability activities as tolerated.        Patient will benefit from skilled therapeutic intervention in order to improve the following deficits and impairments:  Hypomobility, Decreased strength, Pain, Increased muscle spasms, Decreased range of motion, Improper body mechanics, Impaired flexibility, Postural dysfunction, Impaired UE functional use  Visit Diagnosis: Pain in thoracic spine  Abnormal posture     Problem List Patient Active Problem List   Diagnosis Date Noted  . Panic attack as reaction to stress 03/17/2013  . Generalized anxiety disorder 03/17/2013  . Sinusitis, chronic 03/09/2013  . Asthma, chronic 03/09/2013  . Benign paroxysmal positional vertigo 03/09/2013    MBess Harvest PTA 02/19/2016, 5:16 PM  [facility] [place] [address]Phone: [phone]  Fax:  [phone] Name: Valerie GERKENMRN: [id number] of Birth: 9Jul 07, 1987

## 2016-02-25 ENCOUNTER — Encounter: Payer: Self-pay | Admitting: Physical Therapy

## 2016-02-26 ENCOUNTER — Ambulatory Visit: Payer: PRIVATE HEALTH INSURANCE | Attending: Orthopedic Surgery

## 2016-02-26 DIAGNOSIS — M546 Pain in thoracic spine: Secondary | ICD-10-CM | POA: Diagnosis present

## 2016-02-26 DIAGNOSIS — R293 Abnormal posture: Secondary | ICD-10-CM | POA: Insufficient documentation

## 2016-02-26 NOTE — Therapy (Addendum)
[facility] [address]  [place], [place], [address] Phone: [phone]   Fax:  [phone]  Physical Therapy Treatment  Patient Details  Name: Valerie Evans MRN: [id number] Date of Birth: 1986-01-01 Referring Provider: Phylliss Bob, MD  Encounter Date: 02/26/2016      PT End of Session - 02/26/16 1706    Visit Number 14   Number of Visits 16   Date for PT Re-Evaluation 02/22/16   Authorization Type [employer] [employer] Work[employer] approved for additional 8 visits as of 01/22/16   Authorization Time Period 01/22/16 - 02/21/16   Authorization - Visit Number 14   Authorization - Number of Visits 16   PT Start Time 1702   PT Stop Time 1745   PT Time Calculation (min) 43 min   Activity Tolerance Patient tolerated treatment well   Behavior During Therapy [facility] for tasks assessed/performed      Past Medical History:  Diagnosis Date  . Anxiety   . Chronic headache   . Depression   . Lupus   . Thyroid disease   . UTI (lower urinary tract infection)     No past surgical history on file.  There were no vitals filed for this visit.      Subjective Assessment - 02/26/16 1703    Subjective Pt. reporting her lupus has been flared up this past week however she feels that her mid thoracic pain has been somewhat better since last treatment.    Patient Stated Goals "Not to be in so much pain all the time; sleep better"   Currently in Pain? Yes   Pain Score 4    Pain Location Thoracic   Pain Orientation Right   Pain Descriptors / Indicators Dull;Aching   Pain Type Chronic pain   Pain Onset More than a month ago   Pain Frequency Intermittent   Multiple Pain Sites No        Today's Treatment   Therex:  UBE: level  HEP review: Standing Rhomboids stretch x 30 sec  Standing mid doorway stretch x 30 sec  Supine B horizontal shoulder abduction with scap. squeeze x 15 reps  Standing scapular retraction with green  TB x 15 reps  Therex: Standing scapular retraction/extension with green TB x 10 reps  Standing low doorway stretch x 30 sec  Supine Alternating flexion/extension with green TB x 15 reps Prone alternating UE/LE raise x 10 reps each side  BATCA pulldown 15# x 12 reps BATCA low row 15# x 12 reps           PT Short Term Goals - 12/12/15 1810      PT SHORT TERM GOAL #1   Title Independent with initial HEP by 12/14/15   Status Achieved           PT Long Term Goals - 02/26/16 1743      PT LONG TERM GOAL #1   Title Independent with advanced HEP as indicated by 02/22/16   Status Achieved     PT LONG TERM GOAL #2   Title Pt to report no greater than 2/10 pain in thoracic region on average for improved comfort while sitting or sleeping by 02/22/16   Status On-going  02/26/16: Pt. reporting ability to sit for longer periods and pain last shorter duration at this point.  Pt. reporting she doesn't have to, "readjust" as often.  Pt. reporting average pain of 3/10 pain.  PT LONG TERM GOAL #3   Title Pt will report ability to reach overhead and lift up to 20# w/o limitation due to thoracic pain by 02/21/16   Status On-going  02/26/16: Pt. reporting pain still at 7/10 pain while lifting weights at or above 20# overhead.                 Plan - 02/26/16 1707    Clinical Impression Statement Pt. reporting lupus, "flare up", over this past week.  Pt. reporting ability to sit for longer periods of time without feeling need to reposition.  Once pain is felt with this, pt. reports it is less intense and lasts a shorter time.  Pt. reporting average pain level in R-sided thoracic are at 3/10 now at work.  Pt. still reporting pain rising up to 7/10 while lifting heavy objects overhead at work now.  Pt. with limited tolerance for scapular and thoracic extension based resistance training today.  Pt. has demo'd limited ability to progress in resistance throughout POC and still reports TTP over R  mid thoracic paraspinals area.  Pt. is now independent with current HEP and reports adherence.  Pt. with 2 more visits left in PT POC and with MD appointment on 10/9 for f/u.     PT Treatment/Interventions Patient/family education;Neuromuscular re-education;Therapeutic exercise;Manual techniques;Dry needling;Taping;Electrical Stimulation;Moist Heat;Ultrasound;Cryotherapy;Iontophoresis 65m/ml Dexamethasone;ADLs/Self Care Home Management   PT Next Visit Plan Progress scapular stability activities as tolerated.        Patient will benefit from skilled therapeutic intervention in order to improve the following deficits and impairments:  Hypomobility, Decreased strength, Pain, Increased muscle spasms, Decreased range of motion, Improper body mechanics, Impaired flexibility, Postural dysfunction, Impaired UE functional use  Visit Diagnosis: Pain in thoracic spine  Abnormal posture     Problem List Patient Active Problem List   Diagnosis Date Noted  . Panic attack as reaction to stress 03/17/2013  . Generalized anxiety disorder 03/17/2013  . Sinusitis, chronic 03/09/2013  . Asthma, chronic 03/09/2013  . Benign paroxysmal positional vertigo 03/09/2013    MBess Harvest PTA 02/26/2016, 6:16 PM  [facility] [place] [address]Phone: [phone]  Fax:  [phone] Name: Valerie SHIMERMRN: [id number] of Birth: 9Jul 05, 1987 PHYSICAL THERAPY DISCHARGE SUMMARY  Visits from Start of Care: 14  Current functional level related to goals / functional outcomes:   Unable to formally assess status at D/C secondary released by MD at last MD visit, but as of last visit goals only partially met.   Remaining deficits:   Refer to above clinical impression & goal assessment. Pt to continue with HEP.   Education / Equipment:    HEP  Plan: Patient agrees to discharge.  Patient goals were partially met.  Patient is being discharged due to the physician's request.  ?????    JPercival Spanish PT, MPT 03/11/16, 3:59 PM  [facility] [address]Phone: [phone]  Fax:  [phone]

## 2016-03-04 ENCOUNTER — Ambulatory Visit: Payer: PRIVATE HEALTH INSURANCE | Admitting: Physical Therapy

## 2016-03-10 ENCOUNTER — Ambulatory Visit: Payer: PRIVATE HEALTH INSURANCE

## 2016-03-14 MED FILL — traZODone HCL 50 MG TABS: 50 | 30 days supply | Qty: 45 | Fill #1

## 2016-03-14 MED FILL — YASMIN 28 TABLET: 3-0.03 | 84 days supply | Qty: 84 | Fill #0

## 2016-03-19 MED FILL — RESTASIS MULTIDOSE 0.05% EY: 0.05 | 20 days supply | Qty: 6 | Fill #0

## 2016-12-02 ENCOUNTER — Other Ambulatory Visit: Payer: Self-pay | Admitting: Physician Assistant

## 2016-12-02 DIAGNOSIS — M5442 Lumbago with sciatica, left side: Principal | ICD-10-CM

## 2016-12-02 DIAGNOSIS — G8929 Other chronic pain: Secondary | ICD-10-CM

## 2016-12-02 DIAGNOSIS — M5441 Lumbago with sciatica, right side: Principal | ICD-10-CM

## 2016-12-16 ENCOUNTER — Ambulatory Visit
Admission: RE | Admit: 2016-12-16 | Discharge: 2016-12-16 | Disposition: A | Discharge status: Home / Self Care | Payer: 59 | Source: Ambulatory Visit | Attending: Physician Assistant | Admitting: Physician Assistant

## 2016-12-16 DIAGNOSIS — M5442 Lumbago with sciatica, left side: Principal | ICD-10-CM

## 2016-12-16 DIAGNOSIS — M5441 Lumbago with sciatica, right side: Principal | ICD-10-CM

## 2016-12-16 DIAGNOSIS — G8929 Other chronic pain: Secondary | ICD-10-CM

## 2017-01-05 ENCOUNTER — Other Ambulatory Visit: Payer: Self-pay | Admitting: *Deleted

## 2017-01-05 DIAGNOSIS — M79605 Pain in left leg: Principal | ICD-10-CM

## 2017-01-05 DIAGNOSIS — M79604 Pain in right leg: Secondary | ICD-10-CM

## 2017-01-22 ENCOUNTER — Encounter: Payer: Self-pay | Admitting: Neurology

## 2017-01-22 ENCOUNTER — Ambulatory Visit (INDEPENDENT_AMBULATORY_CARE_PROVIDER_SITE_OTHER): Payer: BLUE CROSS/BLUE SHIELD | Admitting: Neurology

## 2017-01-22 DIAGNOSIS — M79604 Pain in right leg: Secondary | ICD-10-CM | POA: Diagnosis not present

## 2017-01-22 DIAGNOSIS — M79605 Pain in left leg: Secondary | ICD-10-CM | POA: Diagnosis not present

## 2017-01-22 NOTE — Procedures (Signed)
[facility]  [address] [place], [address]  [place], [place] [address] Tel: [phone] Fax:  [phone] Test Date:  01/22/2017  Patient: Valerie Evans DOB: 1986-04-17 Physician: Nita Sickle, DO  Sex: Female Height: 5\' 0"  Ref Phys: Burtis Junes, MD  ID#: [id number] Temp: 34.5C Technician:    Patient Complaints: This is a 31 year old female referred for evaluation of low back and bilateral lower extremity pain.  NCV & EMG Findings: Extensive electrodiagnostic testing of the right lower extremity and additional studies of the left shows:  1. Bilateral sural and superficial peroneal sensory responses are within normal limits. 2. Bilateral peroneal and tibial motor responses are within normal limits. 3. There is no evidence of active or chronic motor axon loss changes affecting any of the tested muscles. Motor unit configuration and recruitment pattern is within normal limits.  Impression: This is a normal study. In particular, there is no evidence of a lumbosacral radiculopathy, diffuse myopathy, or sensorimotor polyneuropathy.   ___________________________ Nita Sickle, DO    Nerve Conduction Studies Anti Sensory Summary Table   Site NR Peak (ms) Norm Peak (ms) P-T Amp (V) Norm P-T Amp  Left Sup Peroneal Anti Sensory (Ant Lat Mall)  34.3C  12 cm    2.3 <4.4 23.6 >6  Right Sup Peroneal Anti Sensory (Ant Lat Mall)  34.3C  12 cm    2.3 <4.4 29.8 >6  Left Sural Anti Sensory (Lat Mall)  34.3C  Calf    2.7 <4.4 48.6 >6  Right Sural Anti Sensory (Lat Mall)  34.3C  Calf    2.8 <4.4 50.6 >6   Motor Summary Table   Site NR Onset (ms) Norm Onset (ms) O-P Amp (mV) Norm O-P Amp Site1 Site2 Delta-0 (ms) Dist (cm) Vel (m/s) Norm Vel (m/s)  Left Peroneal Motor (Ext Dig Brev)  34.3C  Ankle    3.9 <5.5 6.4 >3 B Fib Ankle 6.7 33.0 49 >41  B Fib    10.6  5.8  Poplt B Fib 1.4 7.0 50 >41  Poplt    12.0  5.8         Right Peroneal Motor (Ext Dig Brev)  34.3C  Ankle    3.4  <5.5 8.6 >3 B Fib Ankle 6.0 32.0 53 >41  B Fib    9.4  8.5  Poplt B Fib 1.4 7.0 50 >41  Poplt    10.8  8.6         Left Tibial Motor (Abd Hall Brev)  34.3C  Ankle    3.4 <5.8 26.9 >8 Knee Ankle 7.5 33.0 44 >41  Knee    10.9  22.7         Right Tibial Motor (Abd Hall Brev)  34.3C  Ankle    3.0 <5.8 25.3 >8 Knee Ankle 7.2 36.0 50 >41  Knee    10.2  19.3          H Reflex Studies   NR H-Lat (ms) Lat Norm (ms) L-R H-Lat (ms)  Right Tibial (Gastroc)  34.3C     31.29 <35    EMG   Side Muscle Ins Act Fibs Psw Fasc Number Recrt Dur Dur. Amp Amp. Poly Poly. Comment  Right AntTibialis Nml Nml Nml Nml Nml Nml Nml Nml Nml Nml Nml Nml N/A  Right Gastroc Nml Nml Nml Nml Nml Nml Nml Nml Nml Nml Nml Nml N/A  Right Flex Dig Long Nml Nml Nml Nml Nml Nml Nml Nml Nml Nml Nml Nml N/A  Right RectFemoris Nml Nml Nml Nml Nml Nml Nml Nml Nml Nml Nml Nml N/A  Right GluteusMed Nml Nml Nml Nml Nml Nml Nml Nml Nml Nml Nml Nml N/A  Left AntTibialis Nml Nml Nml Nml Nml Nml Nml Nml Nml Nml Nml Nml N/A  Left Gastroc Nml Nml Nml Nml Nml Nml Nml Nml Nml Nml Nml Nml N/A  Left Flex Dig Long Nml Nml Nml Nml Nml Nml Nml Nml Nml Nml Nml Nml N/A  Left RectFemoris Nml Nml Nml Nml Nml Nml Nml Nml Nml Nml Nml Nml N/A  Left GluteusMed Nml Nml Nml Nml Nml Nml Nml Nml Nml Nml Nml Nml N/A  Left Lumbo Parasp Low Nml Nml Nml Nml Nml Nml Nml Nml Nml Nml Nml Nml N/A      Waveforms:

## 2017-06-22 ENCOUNTER — Other Ambulatory Visit: Payer: Self-pay

## 2017-06-22 ENCOUNTER — Encounter (HOSPITAL_COMMUNITY): Payer: Self-pay | Admitting: Emergency Medicine

## 2017-06-22 ENCOUNTER — Emergency Department (HOSPITAL_COMMUNITY): Payer: BLUE CROSS/BLUE SHIELD

## 2017-06-22 ENCOUNTER — Encounter (HOSPITAL_COMMUNITY): Payer: Self-pay | Admitting: *Deleted

## 2017-06-22 ENCOUNTER — Emergency Department (HOSPITAL_COMMUNITY)
Admission: EM | Admit: 2017-06-22 | Discharge: 2017-06-22 | Disposition: A | Discharge status: Home / Self Care | Payer: BLUE CROSS/BLUE SHIELD | Attending: Emergency Medicine | Admitting: Emergency Medicine

## 2017-06-22 ENCOUNTER — Ambulatory Visit (HOSPITAL_COMMUNITY)
Admission: EM | Admit: 2017-06-22 | Discharge: 2017-06-22 | Disposition: A | Discharge status: Home / Self Care | Payer: BLUE CROSS/BLUE SHIELD | Source: Home / Self Care

## 2017-06-22 DIAGNOSIS — Z7982 Long term (current) use of aspirin: Secondary | ICD-10-CM | POA: Diagnosis not present

## 2017-06-22 DIAGNOSIS — J45909 Unspecified asthma, uncomplicated: Secondary | ICD-10-CM | POA: Insufficient documentation

## 2017-06-22 DIAGNOSIS — Z79899 Other long term (current) drug therapy: Secondary | ICD-10-CM | POA: Diagnosis not present

## 2017-06-22 DIAGNOSIS — R109 Unspecified abdominal pain: Secondary | ICD-10-CM | POA: Diagnosis present

## 2017-06-22 DIAGNOSIS — R1013 Epigastric pain: Secondary | ICD-10-CM | POA: Insufficient documentation

## 2017-06-22 DIAGNOSIS — E039 Hypothyroidism, unspecified: Secondary | ICD-10-CM | POA: Insufficient documentation

## 2017-06-22 LAB — URINALYSIS, ROUTINE W REFLEX MICROSCOPIC
BILIRUBIN URINE: NEGATIVE
GLUCOSE, UA: NEGATIVE mg/dL
Hgb urine dipstick: NEGATIVE
Ketones, ur: NEGATIVE mg/dL
Leukocytes, UA: NEGATIVE
Nitrite: NEGATIVE
PH: 6 (ref 5.0–8.0)
PROTEIN: NEGATIVE mg/dL
Specific Gravity, Urine: 1.023 (ref 1.005–1.030)

## 2017-06-22 LAB — COMPREHENSIVE METABOLIC PANEL
ALBUMIN: 4.2 g/dL (ref 3.5–5.0)
ALT: 15 U/L (ref 14–54)
AST: 20 U/L (ref 15–41)
Alkaline Phosphatase: 52 U/L (ref 38–126)
Anion gap: 10 (ref 5–15)
BILIRUBIN TOTAL: 0.5 mg/dL (ref 0.3–1.2)
BUN: 8 mg/dL (ref 6–20)
CHLORIDE: 102 mmol/L (ref 101–111)
CO2: 27 mmol/L (ref 22–32)
CREATININE: 1 mg/dL (ref 0.44–1.00)
Calcium: 10 mg/dL (ref 8.9–10.3)
GFR calc Af Amer: >60 mL/min (ref >60)
GLUCOSE: 96 mg/dL (ref 65–99)
POTASSIUM: 4.4 mmol/L (ref 3.5–5.1)
Sodium: 139 mmol/L (ref 135–145)
TOTAL PROTEIN: 7.3 g/dL (ref 6.5–8.1)

## 2017-06-22 LAB — CBC
HEMATOCRIT: 40.6 % (ref 36.0–46.0)
Hemoglobin: 13.6 g/dL (ref 12.0–15.0)
MCH: 30.6 pg (ref 26.0–34.0)
MCHC: 33.5 g/dL (ref 30.0–36.0)
MCV: 91.4 fL (ref 78.0–100.0)
PLATELETS: 274 10*3/uL (ref 150–400)
RBC: 4.44 MIL/uL (ref 3.87–5.11)
RDW: 12.5 % (ref 11.5–15.5)
WBC: 8.4 10*3/uL (ref 4.0–10.5)

## 2017-06-22 LAB — I-STAT BETA HCG BLOOD, ED (MC, WL, AP ONLY): I-stat hCG, quantitative: <5 m[IU]/mL (ref <5)

## 2017-06-22 LAB — LIPASE, BLOOD: Lipase: 31 U/L (ref 11–51)

## 2017-06-22 MED ORDER — ACETAMINOPHEN 325 MG PO TABS
650.0000 mg | ORAL_TABLET | Freq: Once | ORAL | Status: AC
  Administered 2017-06-22: 650 mg via ORAL
  Filled 2017-06-22: qty 2

## 2017-06-22 MED ORDER — OMEPRAZOLE 20 MG PO CPDR: 20.0000 mg | DELAYED_RELEASE_CAPSULE | Freq: Every day | ORAL | 0 refills | Status: DC

## 2017-06-22 MED ORDER — ALUM & MAG HYDROXIDE-SIMETH 200-200-20 MG/5ML PO SUSP
15.0000 mL | Freq: Once | ORAL | Status: AC
  Administered 2017-06-22: 15 mL via ORAL
  Filled 2017-06-22: qty 30

## 2017-06-22 MED ORDER — ONDANSETRON 8 MG PO TBDP: 8.0000 mg | ORAL_TABLET | Freq: Three times a day (TID) | ORAL | 0 refills | Status: DC | PRN

## 2017-06-22 MED ORDER — FAMOTIDINE 20 MG PO TABS
20.0000 mg | ORAL_TABLET | Freq: Once | ORAL | Status: AC
  Administered 2017-06-22: 20 mg via ORAL

## 2017-06-22 MED ORDER — PANTOPRAZOLE SODIUM 40 MG PO TBEC: 40.0000 mg | DELAYED_RELEASE_TABLET | Freq: Every day | ORAL | 0 refills | Status: DC

## 2017-06-22 NOTE — ED Triage Notes (Signed)
PT reports epigastric pain that woke her up Saturday night. Pt reports pain is fairly constant and radiates to back. PT also has nausea. PT reports family history of gallbladder issues, but no personal history.

## 2017-06-22 NOTE — ED Triage Notes (Signed)
Pt states pain radiates to back and has tenderness to palpation.

## 2017-06-22 NOTE — ED Notes (Signed)
Patient transported to Ultrasound 

## 2017-06-22 NOTE — ED Notes (Signed)
Tests results reviewed. Patient okay to wait.

## 2017-06-22 NOTE — ED Triage Notes (Signed)
Pt states she has mid upper abdominal pain that woke her up from sleep on Saturday and the pain has remained there since.  Pt reports nausea and denies vomiting or diarrhea.  Pt states she has decreased appetite.  No chest pain or sob.  No diabetes

## 2017-06-22 NOTE — ED Provider Notes (Signed)
[facility]   [id number] 06/22/17 Arrival Time: 1227   SUBJECTIVE:  Valerie Evans is a 32 y.o. female who presents to the urgent care with complaint of epigastric pain that woke her up Saturday night. Pt reports pain is fairly constant and radiates to back. PT also has nausea. PT reports family history of gallbladder issues, but no personal history.   Past Medical History:  Diagnosis Date  . Anxiety   . Chronic headache   . Depression   . Lupus   . Thyroid disease   . UTI (lower urinary tract infection)    Family History  Problem Relation Age of Onset  . Heart disease Father   . Depression Father   . Hypertension Mother   . Depression Mother   . Cancer Maternal Aunt    Social History   Socioeconomic History  . Marital status: Married    Spouse name: Not on file  . Number of children: Not on file  . Years of education: Not on file  . Highest education level: Not on file  Social Needs  . Financial resource strain: Not on file  . Food insecurity - worry: Not on file  . Food insecurity - inability: Not on file  . Transportation needs - medical: Not on file  . Transportation needs - non-medical: Not on file  Occupational History  . Not on file  Tobacco Use  . Smoking status: Never Smoker  . Smokeless tobacco: Never Used  Substance and Sexual Activity  . Alcohol use: No    Frequency: Never    Comment: rarely  . Drug use: No  . Sexual activity: Yes    Birth control/protection: Pill  Other Topics Concern  . Not on file  Social History Narrative  . Not on file   Current Meds  Medication Sig  . aspirin 81 MG tablet Take 81 mg by mouth daily.  . Belimumab (BENLYSTA IV) Inject into the vein.  Marland Kitchen. buPROPion (WELLBUTRIN XL) 300 MG 24 hr tablet Take 300 mg by mouth daily.  . clonazePAM (KLONOPIN) 0.5 MG tablet Take 0.5 mg by mouth 4 (four) times daily.  . drospirenone-ethinyl estradiol (SYEDA) 3-0.03 MG tablet Take 1 tablet by mouth daily.  Marland Kitchen. FLUoxetine  (PROZAC) 20 MG tablet Take 20 mg by mouth daily.  . hydroxychloroquine (PLAQUENIL) 200 MG tablet Take 100 mg by mouth daily.  Marland Kitchen. levothyroxine (SYNTHROID, LEVOTHROID) 25 MCG tablet Take 25 mcg by mouth daily before breakfast.  . Multiple Vitamin (MULTIVITAMIN WITH MINERALS) TABS tablet Take 1 tablet by mouth daily.  . traZODone (DESYREL) 50 MG tablet Take 75 mg by mouth at bedtime.   No Known Allergies    ROS: As per HPI, remainder of ROS negative.   OBJECTIVE:   Vitals:   06/22/17 1333 06/22/17 1336  BP:  114/75  Pulse:  87  Resp:  16  Temp:  98.7 F (37.1 C)  TempSrc:  Oral  SpO2:  100%  Weight: 85 lb (38.6 kg)   Height: 5' (1.524 m)      General appearance: alert; no distress Eyes: PERRL; EOMI; conjunctiva normal HENT: normocephalic; atraumatic;  oral mucosa normal Neck: supple Lungs: clear to auscultation bilaterally Heart: regular rate and rhythm Abdomen: soft, tender RUQ and epigastrium; bowel sounds normal; no masses or organomegaly; no guarding or rebound tenderness Back: no CVA tenderness Extremities: no cyanosis or edema; symmetrical with no gross deformities Skin: warm and dry Neurologic: normal gait; grossly normal Psychological: alert and cooperative;  normal mood and affect    Labs:  Results for orders placed or performed during the hospital encounter of 09/10/15  Pulmonary function test  Result Value Ref Range   FVC-Pre 2.64 L   FVC-%Pred-Pre 82 %   FVC-Post 2.50 L   FVC-%Pred-Post 78 %   FVC-%Change-Post -5 %   FEV1-Pre 2.19 L   FEV1-%Pred-Pre 79 %   FEV1-Post 1.95 L   FEV1-%Pred-Post 70 %   FEV1-%Change-Post -11 %   FEV6-Pre 2.64 L   FEV6-%Pred-Pre 83 %   FEV6-Post 2.50 L   FEV6-%Pred-Post 78 %   FEV6-%Change-Post -5 %   Pre FEV1/FVC ratio 83 %   FEV1FVC-%Pred-Pre 97 %   Post FEV1/FVC ratio 78 %   FEV1FVC-%Change-Post -5 %   Pre FEV6/FVC Ratio 100 %   FEV6FVC-%Pred-Pre 100 %   Post FEV6/FVC ratio 100 %   FEV6FVC-%Pred-Post 100 %    FEF 25-75 Pre 2.39 L/sec   FEF2575-%Pred-Pre 73 %   FEF 25-75 Post 1.37 L/sec   FEF2575-%Pred-Post 42 %   FEF2575-%Change-Post -42 %   RV 1.87 L   RV % pred 168 %   TLC 4.29 L   TLC % pred 99 %   DLCO unc 18.63 ml/min/mmHg   DLCO unc % pred 106 %   DL/VA 2.13 ml/min/mmHg/L   DL/VA % pred 086 %    Labs Reviewed - No data to display  No results found.     ASSESSMENT & PLAN:  1. Abdominal pain, epigastric     Meds ordered this encounter  Medications  . ondansetron (ZOFRAN-ODT) 8 MG disintegrating tablet    Sig: Take 1 tablet (8 mg total) by mouth every 8 (eight) hours as needed for nausea.    Dispense:  12 tablet    Refill:  0  . omeprazole (PRILOSEC) 20 MG capsule    Sig: Take 1 capsule (20 mg total) by mouth daily.    Dispense:  30 capsule    Refill:  0    Reviewed expectations re: course of current medical issues. Questions answered. Outlined signs and symptoms indicating need for more acute intervention. Patient verbalized understanding. After Visit Summary given.   Given the amount of discomfort you are having an inability to eat, I think it is better that you just go down the emergency room and have them evaluate you looking for gallstones.   Elvina Sidle, MD 06/22/17 1352

## 2017-06-22 NOTE — ED Provider Notes (Signed)
[facility] EMERGENCY DEPARTMENT Provider Note   CSN: [id number] Arrival date & time: 06/22/17  1406     History   Chief Complaint Chief Complaint  Patient presents with  . Abdominal Pain    HPI Valerie Evans is a 32 y.o. female.  Patient c/o epigastric pain for the past couple days. Symptoms occur at rest, constant, dull, occasional radiate to back. No specific exacerbating or alleviating factors. Not related to exertion. Not related to eating. Nausea. No vomiting. Denies fever or chills. No hx pud, pancreatitis, or gallstones. No cp or sob. No fever or chills.    The history is provided by the patient.  Abdominal Pain   Pertinent negatives include fever, vomiting and headaches.    Past Medical History:  Diagnosis Date  . Anxiety   . Chronic headache   . Depression   . Lupus   . Thyroid disease   . UTI (lower urinary tract infection)     Patient Active Problem List   Diagnosis Date Noted  . Panic attack as reaction to stress 03/17/2013  . Generalized anxiety disorder 03/17/2013  . Sinusitis, chronic 03/09/2013  . Asthma, chronic 03/09/2013  . Benign paroxysmal positional vertigo 03/09/2013    History reviewed. No pertinent surgical history.  OB History    No data available       Home Medications    Prior to Admission medications   Medication Sig Start Date End Date Taking? Authorizing Provider  ALPRAZolam (XANAX) 0.25 MG tablet Take 1 tablet (0.25 mg total) by mouth 3 (three) times daily as needed for anxiety. 06/09/13   Quentin Angst, MD  aspirin 81 MG tablet Take 81 mg by mouth daily.    [provider]  aspirin-acetaminophen-caffeine (EXCEDRIN MIGRAINE) [id number] MG per tablet Take 2 tablets by mouth every 6 (six) hours as needed for pain.    [provider]  baclofen (LIORESAL) 10 MG tablet Take 10 mg by mouth as needed for muscle spasms.    [provider]  Belimumab (BENLYSTA IV) Inject into the  vein.    [provider]  buPROPion (WELLBUTRIN XL) 300 MG 24 hr tablet Take 300 mg by mouth daily.    [provider]  clonazePAM (KLONOPIN) 0.5 MG tablet Take 0.5 mg by mouth 4 (four) times daily.    [provider]  diclofenac sodium (VOLTAREN) 1 % GEL Apply 1 application topically 3 (three) times daily as needed (joint pain).    [provider]  drospirenone-ethinyl estradiol (SYEDA) 3-0.03 MG tablet Take 1 tablet by mouth daily.    [provider]  FLUoxetine (PROZAC) 20 MG tablet Take 20 mg by mouth daily.    [provider]  hydroxychloroquine (PLAQUENIL) 200 MG tablet Take 100 mg by mouth daily.    [provider]  levothyroxine (SYNTHROID, LEVOTHROID) 25 MCG tablet Take 25 mcg by mouth daily before breakfast.    [provider]  Multiple Vitamin (MULTIVITAMIN WITH MINERALS) TABS tablet Take 1 tablet by mouth daily.    [provider]  omeprazole (PRILOSEC) 20 MG capsule Take 1 capsule (20 mg total) by mouth daily. 06/22/17   Elvina Sidle, MD  ondansetron (ZOFRAN) 4 MG tablet Take 1 tablet (4 mg total) by mouth every 6 (six) hours. 12/05/14   Gilda Crease, MD  ondansetron (ZOFRAN-ODT) 8 MG disintegrating tablet Take 1 tablet (8 mg total) by mouth every 8 (eight) hours as needed for nausea. 06/22/17   Lauenstein,  Kenyon Ana, MD  promethazine (PHENERGAN) 25 MG tablet Take 1 tablet (25 mg total) by mouth every 6 (six) hours as needed for nausea or vomiting. 12/05/14   Gilda Crease, MD  sertraline (ZOLOFT) 50 MG tablet Take 50 mg by mouth daily. Reported on 12/03/2015    [provider]  traZODone (DESYREL) 50 MG tablet Take 75 mg by mouth at bedtime.    [provider]    Family History Family History  Problem Relation Age of Onset  . Heart disease Father   . Depression Father   . Hypertension Mother   . Depression Mother   . Cancer Maternal Aunt     Social History Social  History   Tobacco Use  . Smoking status: Never Smoker  . Smokeless tobacco: Never Used  Substance Use Topics  . Alcohol use: No    Frequency: Never    Comment: rarely  . Drug use: No     Allergies   Patient has no known allergies.   Review of Systems Review of Systems  Constitutional: Negative for fever.  HENT: Negative for sore throat.   Eyes: Negative for redness.  Respiratory: Negative for cough and shortness of breath.   Cardiovascular: Negative for chest pain.  Gastrointestinal: Positive for abdominal pain. Negative for vomiting.  Genitourinary: Negative for flank pain.  Musculoskeletal: Negative for neck pain.  Skin: Negative for rash.  Neurological: Negative for headaches.  Hematological: Does not bruise/bleed easily.  Psychiatric/Behavioral: Negative for confusion.     Physical Exam Updated Vital Signs BP 122/88   Pulse 69   Temp 98.3 F (36.8 C) (Oral)   Resp 14   LMP 06/09/2017   SpO2 98%   Physical Exam  Constitutional: She appears well-developed and well-nourished. No distress.  HENT:  Mouth/Throat: Oropharynx is clear and moist.  Eyes: Conjunctivae are normal. No scleral icterus.  Neck: Neck supple. No tracheal deviation present.  Cardiovascular: Normal rate, regular rhythm, normal heart sounds and intact distal pulses. Exam reveals no gallop and no friction rub.  No murmur heard. Pulmonary/Chest: Effort normal and breath sounds normal. No respiratory distress.  Abdominal: Soft. Normal appearance and bowel sounds are normal. She exhibits no distension. There is no tenderness.  No hsm.   Genitourinary:  Genitourinary Comments: No cva tenderness  Musculoskeletal: She exhibits no edema.  Neurological: She is alert.  Skin: Skin is warm and dry. No rash noted. She is not diaphoretic.  Psychiatric: She has a normal mood and affect.  Nursing note and vitals reviewed.    ED Treatments / Results  Labs (all labs ordered are listed, but only  abnormal results are displayed) Results for orders placed or performed during the hospital encounter of 06/22/17  Lipase, blood  Result Value Ref Range   Lipase 31 11 - 51 U/L  Comprehensive metabolic panel  Result Value Ref Range   Sodium 139 135 - 145 mmol/L   Potassium 4.4 3.5 - 5.1 mmol/L   Chloride 102 101 - 111 mmol/L   CO2 27 22 - 32 mmol/L   Glucose, Bld 96 65 - 99 mg/dL   BUN 8 6 - 20 mg/dL   Creatinine, Ser 1.61 0.44 - 1.00 mg/dL   Calcium 09.6 8.9 - 04.5 mg/dL   Total Protein 7.3 6.5 - 8.1 g/dL   Albumin 4.2 3.5 - 5.0 g/dL   AST 20 15 - 41 U/L   ALT 15 14 - 54 U/L   Alkaline Phosphatase 52 38 - 126 U/L  Total Bilirubin 0.5 0.3 - 1.2 mg/dL   GFR calc non Af Amer >60 >60 mL/min   GFR calc Af Amer >60 >60 mL/min   Anion gap 10 5 - 15  CBC  Result Value Ref Range   WBC 8.4 4.0 - 10.5 K/uL   RBC 4.44 3.87 - 5.11 MIL/uL   Hemoglobin 13.6 12.0 - 15.0 g/dL   HCT 16.140.6 09.636.0 - 04.546.0 %   MCV 91.4 78.0 - 100.0 fL   MCH 30.6 26.0 - 34.0 pg   MCHC 33.5 30.0 - 36.0 g/dL   RDW 40.912.5 81.111.5 - 91.415.5 %   Platelets 274 150 - 400 K/uL  Urinalysis, Routine w reflex microscopic  Result Value Ref Range   Color, Urine YELLOW YELLOW   APPearance CLEAR CLEAR   Specific Gravity, Urine 1.023 1.005 - 1.030   pH 6.0 5.0 - 8.0   Glucose, UA NEGATIVE NEGATIVE mg/dL   Hgb urine dipstick NEGATIVE NEGATIVE   Bilirubin Urine NEGATIVE NEGATIVE   Ketones, ur NEGATIVE NEGATIVE mg/dL   Protein, ur NEGATIVE NEGATIVE mg/dL   Nitrite NEGATIVE NEGATIVE   Leukocytes, UA NEGATIVE NEGATIVE  I-Stat beta hCG blood, ED  Result Value Ref Range   I-stat hCG, quantitative <5.0 <5 mIU/mL   Comment 3           EKG  EKG Interpretation None       Radiology [place] Abdomen Limited  Result Date: 06/22/2017 CLINICAL DATA:  Upper abdominal pain radiating to the back. Nausea for 3 days. EXAM: ULTRASOUND ABDOMEN LIMITED RIGHT UPPER QUADRANT COMPARISON:  None. FINDINGS: Gallbladder: No gallstones or wall thickening  visualized. No sonographic Murphy sign noted by sonographer. Common bile duct: Diameter: 2.6 mm, normal Liver: No focal lesion identified. Within normal limits in parenchymal echogenicity. Portal vein is patent on color Doppler imaging with normal direction of blood flow towards the liver. IMPRESSION: Normal examination. Electronically Signed   By: Burman NievesWilliam  Stevens M.D.   On: 06/22/2017 22:31    Procedures Procedures (including critical care time)  Medications Ordered in ED Medications - No data to display   Initial Impression / Assessment and Plan / ED Course  I have reviewed the triage vital signs and the nursing notes.  Pertinent labs & imaging results that were available during my care of the patient were reviewed by me and considered in my medical decision making (see chart for details).  Labs sent.  U/s.   Maalox, pepcid.   Reviewed nursing notes and prior charts for additional history.   Labs and ultrasound unremarkable.   Vitals signs normal.  Patient currently appears stable for d/c.  Recommend outpt pcp f/u.     Final Clinical Impressions(s) / ED Diagnoses   Final diagnoses:  None    ED Discharge Orders    None       Cathren LaineSteinl, Mulan Adan, MD 06/22/17 2237

## 2017-06-22 NOTE — Discharge Instructions (Signed)
Given the amount of discomfort you are having an inability to eat, I think it is better that you just go down the emergency room and have them evaluate you looking for gallstones.

## 2017-06-22 NOTE — Discharge Instructions (Signed)
It was our pleasure to provide your ER care today - we hope that you feel better.  Take protonix (acid blocker medication). You may also try pepcid or maalox for symptom relief.  Follow up with primary care doctor in the next few days for recheck.  Return to ER if worse, new symptoms, fevers, persistent vomiting, other concern.

## 2018-02-18 ENCOUNTER — Encounter: Payer: Self-pay | Admitting: *Deleted

## 2018-02-18 DIAGNOSIS — F339 Major depressive disorder, recurrent, unspecified: Secondary | ICD-10-CM

## 2018-02-18 DIAGNOSIS — F329 Major depressive disorder, single episode, unspecified: Secondary | ICD-10-CM | POA: Insufficient documentation

## 2018-02-18 DIAGNOSIS — F4310 Post-traumatic stress disorder, unspecified: Secondary | ICD-10-CM

## 2018-02-25 ENCOUNTER — Other Ambulatory Visit: Payer: Self-pay

## 2018-02-25 MED ORDER — FLUOXETINE HCL 40 MG PO CAPS: ORAL_CAPSULE | ORAL | 0 refills | Status: DC

## 2018-02-26 ENCOUNTER — Other Ambulatory Visit: Payer: Self-pay

## 2018-02-26 MED ORDER — FLUOXETINE HCL 40 MG PO CAPS: ORAL_CAPSULE | ORAL | 0 refills | Status: DC

## 2018-03-04 ENCOUNTER — Encounter: Payer: Self-pay | Admitting: Physician Assistant

## 2018-03-04 ENCOUNTER — Ambulatory Visit: Payer: Self-pay | Admitting: Physician Assistant

## 2018-03-04 DIAGNOSIS — F411 Generalized anxiety disorder: Secondary | ICD-10-CM | POA: Diagnosis not present

## 2018-03-04 DIAGNOSIS — F4310 Post-traumatic stress disorder, unspecified: Secondary | ICD-10-CM | POA: Diagnosis not present

## 2018-03-04 DIAGNOSIS — F331 Major depressive disorder, recurrent, moderate: Secondary | ICD-10-CM | POA: Diagnosis not present

## 2018-03-04 MED ORDER — TRAZODONE HCL 100 MG PO TABS: 100.0000 mg | ORAL_TABLET | Freq: Every evening | ORAL | 1 refills | Status: DC | PRN

## 2018-03-04 MED ORDER — CLONAZEPAM 1 MG PO TABS: ORAL_TABLET | ORAL | 2 refills | Status: DC
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
## 2018-03-18 ENCOUNTER — Other Ambulatory Visit: Payer: Self-pay

## 2018-03-18 MED ORDER — FLUOXETINE HCL 20 MG PO CAPS: 20.0000 mg | ORAL_CAPSULE | Freq: Every day | ORAL | 0 refills | Status: DC

## 2018-03-30 ENCOUNTER — Encounter: Payer: Self-pay | Admitting: Physician Assistant

## 2018-03-30 ENCOUNTER — Ambulatory Visit: Payer: BLUE CROSS/BLUE SHIELD | Admitting: Physician Assistant

## 2018-03-30 DIAGNOSIS — F331 Major depressive disorder, recurrent, moderate: Secondary | ICD-10-CM

## 2018-03-30 DIAGNOSIS — F411 Generalized anxiety disorder: Secondary | ICD-10-CM | POA: Diagnosis not present

## 2018-03-30 DIAGNOSIS — F4310 Post-traumatic stress disorder, unspecified: Secondary | ICD-10-CM | POA: Diagnosis not present

## 2018-03-30 MED ORDER — ARIPIPRAZOLE 2 MG PO TABS: 2.0000 mg | ORAL_TABLET | Freq: Every day | ORAL | 0 refills | Status: DC

## 2018-03-30 NOTE — Progress Notes (Signed)
[facility]  Patient ID: Valerie Evans,  MRN: [id number]  PCP: Richmond Campbell., PA-C  Date of Evaluation: 03/30/2018 Time spent:25 minutes  Chief Complaint:  Chief Complaint    Depression      HISTORY/CURRENT STATUS: HPI Seen urgently for depression.    States that she's having a "break. I never feel happy and I should be.  I have so many things to be happy about.  I'm not going to kill myself, but if I die, it wouldn't matter to me. I won't do anything like that b/c it would hurt my family and friends, and I don't want to hurt them." States her life is good. "Why am I complaining? I don't like my job. It fits my lifestyle and what I can do physically, but I don't like it.  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  Husband is very supportive. "I'm down all the time.  I cry  Easy.  I stayed home from work on 10/31 afternoon and 11/1 b/c couldn't face it.  Things take so much longer than they should b/c I'm so slow at things.Why don't I get to be happy?"    Has been hurting more all over, from the lupus and fibromyalgia.  "It's hard to tell what's what.  I was feeling bad physically on Halloween and the day after and that was another reason I couldn't go to work. I feel like I've pushed myself mentally to the point of exhaustion and I need a break."   Anxiety is a little worse right now as well.  She is having to use the Klonopin more.  It is helpful when she gets so nervous and it she that she "cannot stand herself".  Sleeps well as long as she has the trazodone.  Past medications for mental health diagnoses include: Cymbalta, Zoloft, Wellbutrin, Xanax, trazodone, Lexapro, Elavil, Sonata Klonopin  Individual Medical History/ Review of Systems: Changes? :No   Allergies: Patient has no known allergies.  Current Medications:  Current Outpatient Medications:  .  aspirin-acetaminophen-caffeine  (EXCEDRIN MIGRAINE) 250-250-65 MG per tablet, Take 2 tablets by mouth every 6 (six) hours as needed for pain., Disp: , Rfl:  .  clonazePAM (KLONOPIN) 1 MG tablet, 1 po bid w/ 1/2 po in middle of day, prn anxiety, Disp: 75 tablet, Rfl: 2 .  drospirenone-ethinyl estradiol (SYEDA) 3-0.03 MG tablet, Take 1 tablet by mouth daily., Disp: , Rfl:  .  FLUoxetine (PROZAC) 20 MG capsule, Take 1 capsule (20 mg total) by mouth daily. Take 1 capsule in addition to the 40mg , Disp: 90 capsule, Rfl: 0 .  FLUoxetine (PROZAC) 40 MG capsule, 1 capsule every morning, Disp: 90 capsule, Rfl: 0 .  hydroxychloroquine (PLAQUENIL) 200 MG tablet, Take 100 mg by mouth daily., Disp: , Rfl:  .  levothyroxine (SYNTHROID, LEVOTHROID) 25 MCG tablet, Take 25 mcg by mouth daily before breakfast., Disp: , Rfl:  .  Multiple Vitamin (MULTIVITAMIN WITH MINERALS) TABS tablet, Take 1 tablet by mouth daily., Disp: , Rfl:  .  traZODone (DESYREL) 100 MG tablet, Take 1 tablet (100 mg total) by mouth at bedtime as needed for sleep., Disp: 180 tablet, Rfl: 1 .  aspirin 81 MG tablet, Take 81 mg by mouth daily., Disp: , Rfl:  .  Belimumab (BENLYSTA IV), Inject into the vein., Disp: , Rfl:  .  diclofenac sodium (VOLTAREN) 1 % GEL, Apply 1  application topically 3 (three) times daily as needed (joint pain)., Disp: , Rfl:  .  omeprazole (PRILOSEC) 20 MG capsule, Take 1 capsule (20 mg total) by mouth daily. (Patient not taking: Reported on 06/22/2017), Disp: 30 capsule, Rfl: 0 .  ondansetron (ZOFRAN) 4 MG tablet, Take 1 tablet (4 mg total) by mouth every 6 (six) hours. (Patient not taking: Reported on 06/22/2017), Disp: 12 tablet, Rfl: 0 .  ondansetron (ZOFRAN-ODT) 8 MG disintegrating tablet, Take 1 tablet (8 mg total) by mouth every 8 (eight) hours as needed for nausea. (Patient not taking: Reported on 06/22/2017), Disp: 12 tablet, Rfl: 0 .  pantoprazole (PROTONIX) 40 MG tablet, Take 1 tablet (40 mg total) by mouth daily. (Patient not taking: Reported on  03/30/2018), Disp: 20 tablet, Rfl: 0 .  promethazine (PHENERGAN) 25 MG tablet, Take 1 tablet (25 mg total) by mouth every 6 (six) hours as needed for nausea or vomiting. (Patient not taking: Reported on 06/22/2017), Disp: 30 tablet, Rfl: 0 Medication Side Effects: none  Family Medical/ Social History: Changes? No  MENTAL HEALTH EXAM:  There were no vitals taken for this visit.There is no height or weight on file to calculate BMI.  General Appearance: Casual  Eye Contact:  Good  Speech:  Clear and Coherent  Volume:  Decreased  Mood:  Depressed  Affect:  Depressed tearful but consolable  Thought Process:  Goal Directed  Orientation:  Full (Time, Place, and Person)  Thought Content: Logical   Suicidal Thoughts:  No  Homicidal Thoughts:  No  Memory:  WNL  Judgement:  Good  Insight:  Good  Psychomotor Activity:  Normal  Concentration:  Concentration: Good  Recall:  Good  Fund of Knowledge: Good  Language: Good  Assets:  Desire for Improvement  ADL's:  Intact  Cognition: WNL  Prognosis:  Good   LABS  03/04/18 CMP Glucose 54, BUN 10, CR .80, LFTS nl,  CBC WBC 3.7, HCT 40.2 Hgb 13.1 TSH 2.26 (patient was able to give me these labs from her chart information from PCP)  DIAGNOSES:    ICD-10-CM   1. Major depressive disorder, recurrent episode, moderate (HCC) F33.1   2. Generalized anxiety disorder F41.1   3. PTSD (post-traumatic stress disorder) F43.10   Physical health problems and including systemic lupus and fibromyalgia.  Receiving Psychotherapy: No    RECOMMENDATIONS: Spent 25 minutes with the patient, 50% of that time was in counseling reference diagnosis and treatment options.  Several medications come to mind including lithium, however I would have to be extremely careful because of the comorbidity of lupus.  Another option would be to add on Abilify, or Seroquel which is helpful in PTSD.  Rexulti may also be a good idea.  We agreed to start Abilify 2 mg as noted  above. Continue other medications as above. I will keep Shabana out of work until Monday, 04/05/2018.  She was out on the afternoon of October 31 and all day on 03/26/2018 and then beginning today through the 8th. I recommended she restart counseling and she is amenable to that. Therapeutic lifestyle choices were discussed. Return in 4 weeks or sooner as needed.   Melony Overly, PA-C

## 2018-04-26 ENCOUNTER — Other Ambulatory Visit: Payer: Self-pay

## 2018-04-26 MED ORDER — ARIPIPRAZOLE 2 MG PO TABS: 2.0000 mg | ORAL_TABLET | Freq: Every day | ORAL | 0 refills | Status: DC

## 2018-05-17 ENCOUNTER — Ambulatory Visit: Payer: BLUE CROSS/BLUE SHIELD | Admitting: Physician Assistant

## 2018-05-17 ENCOUNTER — Encounter: Payer: Self-pay | Admitting: Physician Assistant

## 2018-05-17 DIAGNOSIS — F411 Generalized anxiety disorder: Secondary | ICD-10-CM | POA: Diagnosis not present

## 2018-05-17 DIAGNOSIS — F430 Acute stress reaction: Secondary | ICD-10-CM

## 2018-05-17 DIAGNOSIS — F410 Panic disorder [episodic paroxysmal anxiety] without agoraphobia: Secondary | ICD-10-CM | POA: Diagnosis not present

## 2018-05-17 DIAGNOSIS — F4310 Post-traumatic stress disorder, unspecified: Secondary | ICD-10-CM | POA: Diagnosis not present

## 2018-05-17 DIAGNOSIS — F339 Major depressive disorder, recurrent, unspecified: Secondary | ICD-10-CM

## 2018-05-17 MED ORDER — FLUOXETINE HCL 40 MG PO CAPS: 40.0000 mg | ORAL_CAPSULE | Freq: Every day | ORAL | 1 refills | Status: DC

## 2018-05-17 MED ORDER — CLONAZEPAM 1 MG PO TABS: ORAL_TABLET | ORAL | 5 refills | Status: DC

## 2018-05-17 MED ORDER — FLUOXETINE HCL 20 MG PO CAPS: 20.0000 mg | ORAL_CAPSULE | Freq: Every day | ORAL | 1 refills | Status: DC

## 2018-05-17 NOTE — Progress Notes (Signed)
[facility]  Patient ID: Valerie Evans,  MRN: [id number]  PCP: Richmond CampbellKaplan, Kristen W., PA-C  Date of Evaluation: 05/17/2018 Time spent:15 minutes  Chief Complaint:  Chief Complaint    Follow-up      HISTORY/CURRENT STATUS: HPI Here for routine med f/u.   Feels better than she did.  "I'm not 100% but I am better than I was."  She is now using CBD oil.  It does seem to help depression.  Adding the Abilify at the last visit has also been helpful.  She still not happy in her job.  It does not give her fulfillment.  She continues to have problems with pain from the lupus.  She continues to see her rheumatologist for that.  Patient denies loss of interest in usual activities and is able to enjoy things.  Denies decreased energy or motivation.  Appetite has not changed.  No extreme sadness, tearfulness, or feelings of hopelessness.  Denies any changes in concentration, making decisions or remembering things.  Denies suicidal or homicidal thoughts.  Anxiety is controlled with Klonopin.  She is sleeping pretty well.  Individual Medical History/ Review of Systems: Changes? :No    Past medications for mental health diagnoses include: Cymbalta, Zoloft, Wellbutrin, Xanax, trazodone, Lexapro, Elavil, Sonata Klonopin  Allergies: Patient has no known allergies.  Current Medications:  Current Outpatient Medications:  .  ARIPiprazole (ABILIFY) 2 MG tablet, Take 1 tablet (2 mg total) by mouth daily., Disp: 90 tablet, Rfl: 0 .  clonazePAM (KLONOPIN) 1 MG tablet, 1 po bid w/ 1/2 po in middle of day, prn anxiety, Disp: 75 tablet, Rfl: 2 .  drospirenone-ethinyl estradiol (SYEDA) 3-0.03 MG tablet, Take 1 tablet by mouth daily., Disp: , Rfl:  .  FLUoxetine (PROZAC) 20 MG capsule, Take 1 capsule (20 mg total) by mouth daily. Take 1 capsule in addition to the 40mg , Disp: 90 capsule, Rfl: 0 .  FLUoxetine (PROZAC) 40 MG capsule, 1 capsule every morning, Disp: 90 capsule, Rfl: 0 .   hydroxychloroquine (PLAQUENIL) 200 MG tablet, Take 100 mg by mouth daily., Disp: , Rfl:  .  levothyroxine (SYNTHROID, LEVOTHROID) 25 MCG tablet, Take 25 mcg by mouth daily before breakfast., Disp: , Rfl:  .  Multiple Vitamin (MULTIVITAMIN WITH MINERALS) TABS tablet, Take 1 tablet by mouth daily., Disp: , Rfl:  .  ondansetron (ZOFRAN-ODT) 8 MG disintegrating tablet, Take 1 tablet (8 mg total) by mouth every 8 (eight) hours as needed for nausea., Disp: 12 tablet, Rfl: 0 .  traZODone (DESYREL) 100 MG tablet, Take 1 tablet (100 mg total) by mouth at bedtime as needed for sleep., Disp: 180 tablet, Rfl: 1 .  clonazePAM (KLONOPIN) 1 MG tablet, 1 po bid prn and 1/2 po q afternoon prn anxiety, Disp: 75 tablet, Rfl: 5 .  FLUoxetine (PROZAC) 20 MG capsule, Take 1 capsule (20 mg total) by mouth daily. With the 40 mg to equal 60 mg, Disp: 90 capsule, Rfl: 1 .  FLUoxetine (PROZAC) 40 MG capsule, Take 1 capsule (40 mg total) by mouth daily. Take with 20mg =60mg , Disp: 90 capsule, Rfl: 1 .  promethazine (PHENERGAN) 25 MG tablet, Take 1 tablet (25 mg total) by mouth every 6 (six) hours as needed for nausea or vomiting. (Patient not taking: Reported on 06/22/2017), Disp: 30 tablet, Rfl: 0 Medication Side Effects: none  Family Medical/ Social History: Changes? No  MENTAL HEALTH EXAM:  There were no vitals taken for this visit.There is no height or weight on file to calculate  BMI.  General Appearance: Casual and Well Groomed  Eye Contact:  Good  Speech:  Clear and Coherent  Volume:  Normal  Mood:  Euthymic  Affect:  Appropriate  Thought Process:  Goal Directed  Orientation:  Full (Time, Place, and Person)  Thought Content: Logical   Suicidal Thoughts:  No  Homicidal Thoughts:  No  Memory:  WNL  Judgement:  Good  Insight:  Good  Psychomotor Activity:  Normal  Concentration:  Concentration: Good  Recall:  Good  Fund of Knowledge: Good  Language: Good  Assets:  Desire for Improvement  ADL's:  Intact   Cognition: WNL  Prognosis:  Good    DIAGNOSES:    ICD-10-CM   1. Generalized anxiety disorder F41.1   2. Episode of recurrent major depressive disorder, unspecified depression episode severity (HCC) F33.9   3. PTSD (post-traumatic stress disorder) F43.10   4. Panic attack as reaction to stress F41.0    F43.0     Receiving Psychotherapy: No    RECOMMENDATIONS: Continue current medications without change. Return in 3 months or sooner as needed.  Melony Overlyeresa Winnifred Dufford, PA-C

## 2018-05-20 ENCOUNTER — Encounter: Payer: Self-pay | Admitting: Emergency Medicine

## 2018-06-03 ENCOUNTER — Ambulatory Visit: Payer: Self-pay | Admitting: Physician Assistant

## 2018-07-07 ENCOUNTER — Telehealth: Payer: Self-pay | Admitting: Physician Assistant

## 2018-07-07 ENCOUNTER — Other Ambulatory Visit: Payer: Self-pay

## 2018-07-07 MED ORDER — ARIPIPRAZOLE 2 MG PO TABS: ORAL_TABLET | ORAL | 0 refills | Status: DC

## 2018-07-07 NOTE — Telephone Encounter (Signed)
Valerie Evans called to ask if she could increase her dose of Abilify.  She is taking the lowest dose and wanting to increase by taking an extra pill per day to make dose 4mg ?  Please call.  Next appt is 08/16/18

## 2018-07-07 NOTE — Telephone Encounter (Signed)
Message was left on answering machine of increase in Abilify &.informed New Rx was  called into Pharmacy.

## 2018-07-07 NOTE — Telephone Encounter (Signed)
Yes, ok to increase Abilify to 2 mg, 2 pills every morning.  Please send in prescription for #180 with no refills.  Thanks

## 2018-07-26 ENCOUNTER — Telehealth: Payer: Self-pay | Admitting: Physician Assistant

## 2018-07-26 ENCOUNTER — Other Ambulatory Visit: Payer: Self-pay

## 2018-07-26 MED ORDER — ARIPIPRAZOLE 5 MG PO TABS: ORAL_TABLET | ORAL | 0 refills | Status: DC

## 2018-07-26 NOTE — Telephone Encounter (Signed)
Left voice mail to call back 

## 2018-07-26 NOTE — Telephone Encounter (Signed)
Pt called to see if Rosey Bath could call her. Not having a good day today and wanted to come in but there is nothing today.

## 2018-07-26 NOTE — Telephone Encounter (Signed)
Pt notified and updated rx submitted to her pharmacy

## 2018-07-26 NOTE — Telephone Encounter (Signed)
Traci, please triage.

## 2018-07-26 NOTE — Telephone Encounter (Signed)
Having waves of depression, crying a lot at work. Not able to get anything done. Last change was increase of abilify 4mg  now. C/o anxiety as well. Just not doing well, please advise

## 2018-07-26 NOTE — Telephone Encounter (Signed)
Increase Abilify to 5mg .  Ok to give #30

## 2018-07-27 ENCOUNTER — Telehealth: Payer: Self-pay | Admitting: Physician Assistant

## 2018-07-27 NOTE — Telephone Encounter (Signed)
Left voice mail to call back 

## 2018-07-27 NOTE — Telephone Encounter (Signed)
Made pt appt for tomorrow, but she really needs to talk to you. Not doing good at all.

## 2018-07-28 ENCOUNTER — Ambulatory Visit: Payer: BLUE CROSS/BLUE SHIELD | Admitting: Physician Assistant

## 2018-07-28 ENCOUNTER — Encounter: Payer: Self-pay | Admitting: Physician Assistant

## 2018-07-28 VITALS — BP 93/68 | HR 73

## 2018-07-28 DIAGNOSIS — F331 Major depressive disorder, recurrent, moderate: Secondary | ICD-10-CM

## 2018-07-28 DIAGNOSIS — F900 Attention-deficit hyperactivity disorder, predominantly inattentive type: Secondary | ICD-10-CM | POA: Diagnosis not present

## 2018-07-28 DIAGNOSIS — F411 Generalized anxiety disorder: Secondary | ICD-10-CM

## 2018-07-28 MED ORDER — MODAFINIL 200 MG PO TABS: 100.0000 mg | ORAL_TABLET | Freq: Every day | ORAL | 0 refills | Status: DC

## 2018-07-28 NOTE — Progress Notes (Signed)
[facility]  Patient ID: Valerie Evans,  MRN: [id number]  PCP: Richmond Campbell., PA-C  Date of Evaluation: 07/28/2018 Time spent:15 minutes  Chief Complaint:  Chief Complaint    ADD      HISTORY/CURRENT STATUS: HPI Not doing well.  For several weeks, has had more trouble focusing.  Is getting in trouble at work b/c she can't get her work done.  "I don't know how much longer I'm going to be able to do it.  It stresses me out.  I feel like I'm going to lose my job if I don't do something.  And rightly so, I either sit and cry at work, or stare off into space."  States she's always had problems with concentrating and was dx w/ ADD in HS.  Took Adderall then.  It helped. Not calling out of work. But doesn't do anything productive at work.   Also feels really depressed. Sleeps more than nl. Positive anhedonia, "I don't know what it means to enjoy things." Isolates.  Feels on edge a lot. Has generalized anxiety, not really having panic attacks.  "Klonopin helps that." No SI/HI  We increased the Abilify about 3 weeks ago.  To finish out her supply, she's taking 4mg . Doesn't feel any different.   Denies muscle or joint pain, stiffness, or dystonia.  Denies dizziness, syncope, seizures, numbness, tingling, tremor, tics, unsteady gait, slurred speech, confusion.   Individual Medical History/ Review of Systems: Changes? :No  Lupus is stable now.   Past medications for mental health diagnoses include: Cymbalta, Zoloft, Wellbutrin, Xanax, trazodone, Lexapro, Elavil, Sonata Klonopin  Allergies: Patient has no known allergies.  Current Medications:  Current Outpatient Medications:  .  clonazePAM (KLONOPIN) 1 MG tablet, 1 po bid prn and 1/2 po q afternoon prn anxiety, Disp: 75 tablet, Rfl: 5 .  drospirenone-ethinyl estradiol (SYEDA) 3-0.03 MG tablet, Take 1 tablet by mouth daily., Disp: , Rfl:  .  FLUoxetine (PROZAC) 20 MG capsule, Take 1 capsule (20 mg total) by mouth daily.  Take 1 capsule in addition to the 40mg , Disp: 90 capsule, Rfl: 0 .  FLUoxetine (PROZAC) 40 MG capsule, 1 capsule every morning, Disp: 90 capsule, Rfl: 0 .  hydroxychloroquine (PLAQUENIL) 200 MG tablet, Take 100 mg by mouth daily., Disp: , Rfl:  .  levothyroxine (SYNTHROID, LEVOTHROID) 25 MCG tablet, Take 25 mcg by mouth daily before breakfast., Disp: , Rfl:  .  Multiple Vitamin (MULTIVITAMIN WITH MINERALS) TABS tablet, Take 1 tablet by mouth daily., Disp: , Rfl:  .  ondansetron (ZOFRAN-ODT) 8 MG disintegrating tablet, Take 1 tablet (8 mg total) by mouth every 8 (eight) hours as needed for nausea., Disp: 12 tablet, Rfl: 0 .  traZODone (DESYREL) 100 MG tablet, Take 1 tablet (100 mg total) by mouth at bedtime as needed for sleep., Disp: 180 tablet, Rfl: 1 .  ARIPiprazole (ABILIFY) 5 MG tablet, Take 1 tablet every morning (Patient taking differently: 4 mg. Take 1 tablet every morning), Disp: 30 tablet, Rfl: 0 .  modafinil (PROVIGIL) 200 MG tablet, Take 0.5 tablets (100 mg total) by mouth daily after breakfast., Disp: 30 tablet, Rfl: 0 .  promethazine (PHENERGAN) 25 MG tablet, Take 1 tablet (25 mg total) by mouth every 6 (six) hours as needed for nausea or vomiting. (Patient not taking: Reported on 06/22/2017), Disp: 30 tablet, Rfl: 0 Medication Side Effects: none  Family Medical/ Social History: Changes? No  MENTAL HEALTH EXAM:  Blood pressure 93/68, pulse 73.There is no height or  weight on file to calculate BMI.  General Appearance: Casual and Well Groomed  Eye Contact:  Good  Speech:  Clear and Coherent  Volume:  Normal  Mood:  Euthymic  Affect:  Appropriate  Thought Process:  Goal Directed  Orientation:  Full (Time, Place, and Person)  Thought Content: Logical   Suicidal Thoughts:  No  Homicidal Thoughts:  No  Memory:  WNL  Judgement:  Good  Insight:  Good  Psychomotor Activity:  Normal  Concentration:  Concentration: Fair and Attention Span: Fair  Recall:  Good  Fund of Knowledge:  Good  Language: Good  Assets:  Desire for Improvement  ADL's:  Intact  Cognition: WNL  Prognosis:  Good    DIAGNOSES:    ICD-10-CM   1. Major depressive disorder, recurrent episode, moderate (HCC) F33.1   2. Generalized anxiety disorder F41.1   3. Attention deficit hyperactivity disorder (ADHD), predominantly inattentive type F90.0     Receiving Psychotherapy: No    RECOMMENDATIONS: Start modafinil 200 mg one half p.o. every morning.  I gave her information about [facility] cheaper.  I [facility] the prescription to [facility] and she knows not to use her insurance but tell them she has the good Rx information. Continue Klonopin 1 mg 1/2-1 twice daily as needed. Continue Abilify 4 mg p.o. daily for now.  When her current supply runs out increased to 5 mg p.o. daily. Continue Prozac 60 mg daily. Continue trazodone 100 mg nightly as needed sleep. Return in 4 weeks.   Melony Overly, PA-C   This record has been created using [employer].  Chart creation errors have been sought, but may not always have been located and corrected. Such creation errors do not reflect on the standard of medical care.

## 2018-07-29 NOTE — Telephone Encounter (Signed)
Was seen yesterday by provider see encounter for follow up

## 2018-08-16 ENCOUNTER — Ambulatory Visit: Payer: BLUE CROSS/BLUE SHIELD | Admitting: Physician Assistant

## 2018-08-25 ENCOUNTER — Ambulatory Visit: Payer: BLUE CROSS/BLUE SHIELD | Admitting: Physician Assistant

## 2018-08-27 ENCOUNTER — Other Ambulatory Visit: Payer: Self-pay | Admitting: Physician Assistant

## 2018-08-31 ENCOUNTER — Other Ambulatory Visit: Payer: Self-pay

## 2018-08-31 ENCOUNTER — Telehealth: Payer: Self-pay | Admitting: Physician Assistant

## 2018-08-31 MED ORDER — ARIPIPRAZOLE 5 MG PO TABS
5.0000 mg | ORAL_TABLET | Freq: Every day | ORAL | 0 refills | Status: DC
Start: 2018-08-31 — End: 2018-12-01

## 2018-08-31 MED ORDER — CLONAZEPAM 1 MG PO TABS: ORAL_TABLET | ORAL | 1 refills | Status: DC

## 2018-08-31 NOTE — Telephone Encounter (Signed)
Pt called need refill for Abilify and Clonazepam sent to [facility] [phone]. Pt lost job and this is cheapest with [facility]. Will  Advise which pharm with each refill, depending on cost with [facility]

## 2018-08-31 NOTE — Telephone Encounter (Signed)
Pt called need refill on Abilify 5 mg sent to [facility]. Pt lost her job and this is cheapest with [facility]. Clonazapam refill will also need to be sent there. Will advise which pharm when requesting refill.

## 2018-08-31 NOTE — Telephone Encounter (Signed)
FYI abilify sent, clonopin pended for provider

## 2018-08-31 NOTE — Telephone Encounter (Signed)
Last fill clonazepam 08/07/2018 abilify submitted to [facility] on 08/27/2018  Will change to [facility] as requested.

## 2018-08-31 NOTE — Telephone Encounter (Signed)
I sent BZD in

## 2018-09-14 ENCOUNTER — Telehealth: Payer: Self-pay | Admitting: Physician Assistant

## 2018-09-14 NOTE — Telephone Encounter (Signed)
Patient called and said that the [facility]ac [facility] [address] [facility] of the [facility]

## 2018-09-14 NOTE — Telephone Encounter (Signed)
Patiernt called and said that she needs refills on her medicines. She needs provigil 200 mg sent to the [facility] on [address]. She needs her prozac 20 mg needs to be sent [facility] [address]. Last appointment was in November. She is unemployed right now.

## 2018-09-14 NOTE — Telephone Encounter (Signed)
Left voicemail to clarify dosage on prozac

## 2018-09-14 NOTE — Telephone Encounter (Signed)
Valerie Evans, please ask her if she wants Prozac 20 mg to take 3, or a 20+40= 60mg .  You can send in a 3 month supply w/ 1 RF.  Thanks!

## 2018-09-15 ENCOUNTER — Other Ambulatory Visit: Payer: Self-pay | Admitting: Physician Assistant

## 2018-09-20 NOTE — Telephone Encounter (Signed)
Left pt. A VM. To return call.

## 2018-09-22 ENCOUNTER — Other Ambulatory Visit: Payer: Self-pay

## 2018-09-22 MED ORDER — FLUOXETINE HCL 20 MG PO CAPS: 20.0000 mg | ORAL_CAPSULE | Freq: Every day | ORAL | 1 refills | Status: DC

## 2018-09-22 MED ORDER — FLUOXETINE HCL 40 MG PO CAPS: ORAL_CAPSULE | ORAL | 1 refills | Status: DC

## 2018-09-22 NOTE — Telephone Encounter (Signed)
Pt has not returned call about preference on fluoxetine. Will go ahead and submit to pharmacy and they can contact us if there needs to be a change.

## 2018-09-22 NOTE — Telephone Encounter (Signed)
Pt returned your call Valerie Evans. [phone].

## 2018-09-22 NOTE — Telephone Encounter (Signed)
Spoke with patient and let her know Fluoxetine was sent in.

## 2018-11-29 ENCOUNTER — Other Ambulatory Visit: Payer: Self-pay | Admitting: Physician Assistant

## 2018-11-30 NOTE — Telephone Encounter (Signed)
Last visit 07/2018 didn't come for 4 week f/up

## 2018-11-30 NOTE — Telephone Encounter (Signed)
Pt scheduled for 1:00 12/01/2018

## 2018-11-30 NOTE — Telephone Encounter (Signed)
Tammy please call her to set up appt. Let me know when she does and then I'll send this in.  Thanks.

## 2018-12-01 ENCOUNTER — Encounter: Payer: Self-pay | Admitting: Physician Assistant

## 2018-12-01 ENCOUNTER — Other Ambulatory Visit: Payer: Self-pay

## 2018-12-01 ENCOUNTER — Ambulatory Visit: Payer: BLUE CROSS/BLUE SHIELD | Admitting: Physician Assistant

## 2018-12-01 DIAGNOSIS — F4310 Post-traumatic stress disorder, unspecified: Secondary | ICD-10-CM

## 2018-12-01 DIAGNOSIS — F411 Generalized anxiety disorder: Secondary | ICD-10-CM | POA: Diagnosis not present

## 2018-12-01 DIAGNOSIS — F331 Major depressive disorder, recurrent, moderate: Secondary | ICD-10-CM

## 2018-12-01 DIAGNOSIS — F900 Attention-deficit hyperactivity disorder, predominantly inattentive type: Secondary | ICD-10-CM | POA: Diagnosis not present

## 2018-12-01 MED ORDER — MODAFINIL 200 MG PO TABS: 200.0000 mg | ORAL_TABLET | Freq: Every day | ORAL | 2 refills | Status: DC

## 2018-12-01 MED ORDER — TRAZODONE HCL 100 MG PO TABS: 100.0000 mg | ORAL_TABLET | Freq: Every evening | ORAL | 1 refills | Status: DC | PRN

## 2018-12-01 MED ORDER — CLONAZEPAM 1 MG PO TABS: ORAL_TABLET | ORAL | 2 refills | Status: DC

## 2018-12-01 MED ORDER — ARIPIPRAZOLE 5 MG PO TABS: 5.0000 mg | ORAL_TABLET | Freq: Every day | ORAL | 0 refills | Status: DC

## 2018-12-01 NOTE — Progress Notes (Signed)
[facility]  Patient ID: Valerie Evans,  MRN: [id number]  PCP: Aletha Halim., PA-C  Date of Evaluation: 12/01/2018 Time spent:15 minutes  Chief Complaint:  Chief Complaint    Anxiety; Depression; Follow-up      HISTORY/CURRENT STATUS: HPI  For routine med check.  States she is doing pretty well considering everything that is going on in the world and in her life.  She lost her job in March and has decided not to try to look for another job right now.  Her physical health with the lupus is stable but states she does not need extra stress on top of what is going on and she is able to stay home now so is taking advantage of that.  Patient denies loss of interest in usual activities and is able to enjoy things.  Denies decreased energy or motivation.  Appetite has not changed.  No extreme sadness, tearfulness, or feelings of hopelessness.  Denies any changes in concentration, making decisions or remembering things.  Denies suicidal or homicidal thoughts.  Sleeps well and is having no nightmares.  Anxiety is still pretty bad at times and if she does not take the Klonopin it will get worse.  It is more of a generalized anxiety as opposed to panic attacks now.  She does still have panic attacks occasionally maybe every few weeks.  The modafinil has helped but she is wondering if we could increase the dose.  If she does not take it, she has a hard time getting up in the morning and being motivated to do anything.  She is much better when she takes it but it does not work as well as it did initially.  She is more able to focus and complete things around the home being on it.  Denies dizziness, syncope, seizures, numbness, tingling, tremor, tics, unsteady gait, slurred speech, confusion. Denies muscle or joint pain, stiffness, or dystonia.  Individual Medical History/ Review of Systems: Changes? :No    Past medications for mental health diagnoses include: Cymbalta, Zoloft,  Wellbutrin, Xanax, trazodone, Lexapro, Elavil, Sonata Klonopin  Allergies: Patient has no known allergies.  Current Medications:  Current Outpatient Medications:  .  ARIPiprazole (ABILIFY) 5 MG tablet, Take 1 tablet (5 mg total) by mouth daily., Disp: 90 tablet, Rfl: 0 .  clonazePAM (KLONOPIN) 1 MG tablet, TAKE 1 TABLET BY MOUTH TWICE A DAY AS NEEDED AND TAKE 1/2 TABLET EVERY AFTERNOON AS NEEDED FOR ANXIETY, Disp: 75 tablet, Rfl: 2 .  FLUoxetine (PROZAC) 40 MG capsule, 1 capsule every morning, Disp: 90 capsule, Rfl: 1 .  hydroxychloroquine (PLAQUENIL) 200 MG tablet, Take 100 mg by mouth daily., Disp: , Rfl:  .  levothyroxine (SYNTHROID, LEVOTHROID) 25 MCG tablet, Take 25 mcg by mouth daily before breakfast., Disp: , Rfl:  .  modafinil (PROVIGIL) 200 MG tablet, Take 1 tablet (200 mg total) by mouth daily., Disp: 30 tablet, Rfl: 2 .  Multiple Vitamin (MULTIVITAMIN WITH MINERALS) TABS tablet, Take 1 tablet by mouth daily., Disp: , Rfl:  .  ondansetron (ZOFRAN-ODT) 8 MG disintegrating tablet, Take 1 tablet (8 mg total) by mouth every 8 (eight) hours as needed for nausea., Disp: 12 tablet, Rfl: 0 .  promethazine (PHENERGAN) 25 MG tablet, Take 1 tablet (25 mg total) by mouth every 6 (six) hours as needed for nausea or vomiting., Disp: 30 tablet, Rfl: 0 .  traZODone (DESYREL) 100 MG tablet, Take 1 tablet (100 mg total) by mouth at bedtime as needed for  sleep., Disp: 180 tablet, Rfl: 1 .  ARIPiprazole (ABILIFY) 5 MG tablet, Take 1 tablet every morning (Patient not taking: Reported on 12/01/2018), Disp: 30 tablet, Rfl: 0 .  drospirenone-ethinyl estradiol (SYEDA) 3-0.03 MG tablet, Take 1 tablet by mouth daily., Disp: , Rfl:  .  FLUoxetine (PROZAC) 20 MG capsule, Take 1 capsule (20 mg total) by mouth daily. Take 1 capsule in addition to the 40mg  (Patient not taking: Reported on 12/01/2018), Disp: 90 capsule, Rfl: 1 Medication Side Effects: none  Family Medical/ Social History: Changes? Yes lost her job   Beginning of March.   MENTAL HEALTH EXAM:  There were no vitals taken for this visit.There is no height or weight on file to calculate BMI.  General Appearance: Casual  Eye Contact:  Good  Speech:  Clear and Coherent  Volume:  Normal  Mood:  Euthymic  Affect:  Appropriate  Thought Process:  Goal Directed  Orientation:  Full (Time, Place, and Person)  Thought Content: Logical   Suicidal Thoughts:  No  Homicidal Thoughts:  No  Memory:  WNL  Judgement:  Good  Insight:  Good  Psychomotor Activity:  Normal  Concentration:  Concentration: Good and Attention Span: Fair  Recall:  Good  Fund of Knowledge: Good  Language: Good  Assets:  Desire for Improvement  ADL's:  Intact  Cognition: WNL  Prognosis:  Good    DIAGNOSES:    ICD-10-CM   1. Major depressive disorder, recurrent episode, moderate (HCC)  F33.1   2. PTSD (post-traumatic stress disorder)  F43.10   3. Generalized anxiety disorder  F41.1   4. Attention deficit hyperactivity disorder (ADHD), predominantly inattentive type  F90.0     Receiving Psychotherapy: No    RECOMMENDATIONS:  Continue Abilify 5 mg p.o. every morning. Continue Klonopin 1 mg twice daily as needed with an occasional half during the afternoon as needed. Continue Prozac 40 mg daily. Continue Provigil 200 mg 1 every morning. Continue trazodone 100 mg nightly as needed. Return in 3 months.  Melony Overlyeresa [employer] [place]on, PA-C   This record has been created using [employer] software.  Chart creation errors have been sought, but may not always have been located and corrected. Such creation errors do not reflect on the standard of medical care.

## 2018-12-02 MED ORDER — ARIPIPRAZOLE 5 MG PO TABS: 5.0000 mg | ORAL_TABLET | Freq: Every day | ORAL | 0 refills | Status: DC

## 2018-12-31 ENCOUNTER — Other Ambulatory Visit: Payer: Self-pay | Admitting: Physician Assistant

## 2019-03-01 ENCOUNTER — Telehealth: Payer: Self-pay | Admitting: Physician Assistant

## 2019-03-01 NOTE — Telephone Encounter (Signed)
We had to cancel Valerie Evans's appt for 10/8 at 1:30.  She is now rescheduled to 10/30, and is on the CXL.  She states she needs to see you prior to 10/30.  Any suggestions?

## 2019-03-02 NOTE — Telephone Encounter (Signed)
Valerie Evans, at your convenience, could you please call her and see how I can help her before the appt?  Thanks

## 2019-03-03 ENCOUNTER — Ambulatory Visit: Payer: BC Managed Care – PPO | Admitting: Physician Assistant

## 2019-03-04 ENCOUNTER — Other Ambulatory Visit: Payer: Self-pay | Admitting: Physician Assistant

## 2019-03-04 MED ORDER — FLUOXETINE HCL 40 MG PO CAPS: 80.0000 mg | ORAL_CAPSULE | Freq: Every day | ORAL | 1 refills | Status: DC

## 2019-03-04 NOTE — Telephone Encounter (Signed)
I called pt. More anxious.  Cont Klonopin as ordered.  Increased Prozac to 80mg /day.  Decrease Modafonil to prn, not qd. Decrease/stop caffeine.

## 2019-03-25 ENCOUNTER — Ambulatory Visit: Payer: BC Managed Care – PPO | Admitting: Physician Assistant

## 2019-04-04 ENCOUNTER — Other Ambulatory Visit: Payer: Self-pay | Admitting: Physician Assistant

## 2019-04-06 ENCOUNTER — Other Ambulatory Visit: Payer: Self-pay | Admitting: Physician Assistant

## 2019-04-07 ENCOUNTER — Telehealth: Payer: Self-pay | Admitting: Physician Assistant

## 2019-04-07 NOTE — Telephone Encounter (Signed)
Patient called and said that the pharmacy said that they got a denial on the modafinil 200 mg 1 tab daily. It needs to go to the [facility] on [address]. Next appt 12/11.

## 2019-04-08 ENCOUNTER — Other Ambulatory Visit: Payer: Self-pay | Admitting: Physician Assistant

## 2019-04-08 MED ORDER — MODAFINIL 200 MG PO TABS: 100.0000 mg | ORAL_TABLET | Freq: Every day | ORAL | 0 refills | Status: DC | PRN

## 2019-04-08 NOTE — Telephone Encounter (Signed)
I sent it in.  I'm not sure why I d/c it.  I said 'change in tx' but was a mistake.  Sorry.

## 2019-04-08 NOTE — Telephone Encounter (Signed)
Noted  

## 2019-04-13 ENCOUNTER — Other Ambulatory Visit: Payer: Self-pay | Admitting: Physician Assistant

## 2019-04-14 NOTE — Telephone Encounter (Signed)
Has apt 12/11

## 2019-05-06 ENCOUNTER — Ambulatory Visit (INDEPENDENT_AMBULATORY_CARE_PROVIDER_SITE_OTHER): Payer: BLUE CROSS/BLUE SHIELD | Admitting: Physician Assistant

## 2019-05-06 ENCOUNTER — Encounter: Payer: Self-pay | Admitting: Physician Assistant

## 2019-05-06 ENCOUNTER — Other Ambulatory Visit: Payer: Self-pay

## 2019-05-06 DIAGNOSIS — F4310 Post-traumatic stress disorder, unspecified: Secondary | ICD-10-CM | POA: Diagnosis not present

## 2019-05-06 DIAGNOSIS — F331 Major depressive disorder, recurrent, moderate: Secondary | ICD-10-CM

## 2019-05-06 DIAGNOSIS — F411 Generalized anxiety disorder: Secondary | ICD-10-CM

## 2019-05-06 DIAGNOSIS — F900 Attention-deficit hyperactivity disorder, predominantly inattentive type: Secondary | ICD-10-CM | POA: Diagnosis not present

## 2019-05-06 MED ORDER — MODAFINIL 200 MG PO TABS: 200.0000 mg | ORAL_TABLET | Freq: Every day | ORAL | 1 refills | Status: DC

## 2019-05-06 MED ORDER — GABAPENTIN 300 MG PO CAPS: ORAL_CAPSULE | ORAL | 1 refills | Status: DC

## 2019-05-06 MED ORDER — CLONAZEPAM 1 MG PO TABS: 1.0000 mg | ORAL_TABLET | Freq: Three times a day (TID) | ORAL | 1 refills | Status: DC | PRN

## 2019-05-06 NOTE — Progress Notes (Signed)
[facility]  Patient ID: Valerie Evans,  MRN: [id number]  PCP: Richmond Campbell., PA-C  Date of Evaluation: 05/06/2019 Time spent:30 minutes  Chief Complaint:  Chief Complaint    Anxiety; Depression; Follow-up      HISTORY/CURRENT STATUS: HPI For routine med check.  Has been a lot more anxious.  No triggers except for her own health.  She is not working right now and is considering applying for disability due to the lupus and her mental health issues.  "I have tried to work.  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  I just cannot do it."  She has panic attacks a lot but they are not the worst part.  She has this underlying sense of something bad happening most of the time.  It is worse when she sits around by herself.  But physically she does not feel like doing anything else.  The anxiety seems to be worse when she has trouble getting up and doing things.  The modafinil has really helped that, but she has to take a whole pill (200 mg) for it to be effective.  She does not feel happy although she is not crying all the time.  Since we increased the Prozac, she has has felt less depressed but feels "blah."  States she does not feel like doing much of anything, energy and motivation are fair to good.  Denies suicidal or homicidal thoughts.  Patient denies increased energy with decreased need for sleep, no increased talkativeness, no racing thoughts, no impulsivity or risky behaviors, no increased spending, no increased libido, no grandiosity.  Review of Systems  Constitutional: Positive for malaise/fatigue.  HENT: Negative.   Eyes: Negative.   Respiratory: Negative.   Cardiovascular: Negative.   Gastrointestinal: Negative.   Genitourinary: Negative.   Skin: Negative.   Endo/Heme/Allergies: Negative.    Denies dizziness, syncope, seizures, numbness, tingling, tremor, tics, unsteady gait, slurred speech, confusion. Denies muscle or joint pain,  stiffness, or dystonia.  Individual Medical History/ Review of Systems: Changes? :No    Past medications for mental health diagnoses include: Cymbalta, Zoloft, Wellbutrin, Xanax, trazodone, Lexapro, Elavil, Sonata Klonopin, Gabapentin  Allergies: Patient has no known allergies.  Current Medications:  Current Outpatient Medications:  .  ARIPiprazole (ABILIFY) 5 MG tablet, TAKE 1 TABLET BY MOUTH EVERY DAY, Disp: 90 tablet, Rfl: 0 .  clonazePAM (KLONOPIN) 1 MG tablet, Take 1 tablet (1 mg total) by mouth 3 (three) times daily as needed for anxiety. TAKE 1 TABLET BY MOUTH TWICE A DAY AND TAKE 1/2 TABLET EVERY AFTERNOON AS NEEDED FOR ANXIETY, Disp: 90 tablet, Rfl: 1 .  FLUoxetine (PROZAC) 40 MG capsule, TAKE 2 CAPSULES BY MOUTH DAILY, Disp: 180 capsule, Rfl: 1 .  hydroxychloroquine (PLAQUENIL) 200 MG tablet, Take 100 mg by mouth daily., Disp: , Rfl:  .  levothyroxine (SYNTHROID, LEVOTHROID) 25 MCG tablet, Take 25 mcg by mouth daily before breakfast., Disp: , Rfl:  .  modafinil (PROVIGIL) 200 MG tablet, Take 1 tablet (200 mg total) by mouth daily., Disp: 30 tablet, Rfl: 1 .  Multiple Vitamin (MULTIVITAMIN WITH MINERALS) TABS tablet, Take 1 tablet by mouth daily., Disp: , Rfl:  .  ondansetron (ZOFRAN-ODT) 8 MG disintegrating tablet, Take 1 tablet (8 mg total) by mouth every 8 (eight) hours as needed for nausea., Disp: 12 tablet, Rfl: 0 .  promethazine (PHENERGAN) 25 MG tablet, Take 1 tablet (25 mg total) by mouth every 6 (six) hours as needed for  nausea or vomiting., Disp: 30 tablet, Rfl: 0 .  traZODone (DESYREL) 100 MG tablet, Take 1 tablet (100 mg total) by mouth at bedtime as needed for sleep., Disp: 180 tablet, Rfl: 1 .  drospirenone-ethinyl estradiol (SYEDA) 3-0.03 MG tablet, Take 1 tablet by mouth daily., Disp: , Rfl:  .  gabapentin (NEURONTIN) 300 MG capsule, 1 po qhs for 4 days, then 1 po bid., Disp: 60 capsule, Rfl: 1 Medication Side Effects: none  Family Medical/ Social History: Changes?  Yes, thinking of applying for disability.  MENTAL HEALTH EXAM:  There were no vitals taken for this visit.There is no height or weight on file to calculate BMI.  General Appearance: Casual, Neat and Well Groomed  Eye Contact:  Good  Speech:  Clear and Coherent  Volume:  Normal  Mood:  Depressed  Affect:  Depressed  Thought Process:  Goal Directed  Orientation:  Full (Time, Place, and Person)  Thought Content: Logical   Suicidal Thoughts:  No  Homicidal Thoughts:  No  Memory:  WNL  Judgement:  Good  Insight:  Good  Psychomotor Activity:  Normal  Concentration:  Concentration: Good and Attention Span: Good  Recall:  Good  Fund of Knowledge: Good  Language: Good  Assets:  Desire for Improvement  ADL's:  Intact  Cognition: WNL  Prognosis:  Good    DIAGNOSES:    ICD-10-CM   1. Generalized anxiety disorder  F41.1   2. Major depressive disorder, recurrent episode, moderate (HCC)  F33.1   3. PTSD (post-traumatic stress disorder)  F43.10   4. Attention deficit hyperactivity disorder (ADHD), predominantly inattentive type  F90.0     Receiving Psychotherapy: No    RECOMMENDATIONS:  PDMP was reviewed. Increase Klonopin 1 mg to 1 p.o. 3 times daily as needed.  Keep dose as low as possible. Continue Abilify 5 mg every morning. Continue Prozac 40 mg, 2 p.o. daily. Start gabapentin 300 mg 1 nightly for 4 nights and then may increase to 1 twice daily, for anxiety. Continue modafinil 200 mg every morning.  We discussed the fact that this may increase the anxiety but she states it actually makes her feel better when she has a little more energy and the drive to do something. Continue trazodone 100 mg nightly as needed. Recommend therapy. Consider a therapy lamp to help with depression over the wintertime. Return in 4 to 6 weeks.  Donnal Moat, PA-C

## 2019-05-08 ENCOUNTER — Encounter (HOSPITAL_COMMUNITY): Payer: Self-pay

## 2019-05-08 ENCOUNTER — Other Ambulatory Visit: Payer: Self-pay

## 2019-05-08 ENCOUNTER — Emergency Department (HOSPITAL_COMMUNITY)
Admission: EM | Admit: 2019-05-08 | Discharge: 2019-05-09 | Disposition: A | Discharge status: Home / Self Care | Payer: BLUE CROSS/BLUE SHIELD | Attending: Emergency Medicine | Admitting: Emergency Medicine

## 2019-05-08 DIAGNOSIS — Z79899 Other long term (current) drug therapy: Secondary | ICD-10-CM | POA: Insufficient documentation

## 2019-05-08 DIAGNOSIS — K2900 Acute gastritis without bleeding: Secondary | ICD-10-CM

## 2019-05-08 DIAGNOSIS — R1012 Left upper quadrant pain: Secondary | ICD-10-CM | POA: Diagnosis present

## 2019-05-08 DIAGNOSIS — J45909 Unspecified asthma, uncomplicated: Secondary | ICD-10-CM | POA: Diagnosis not present

## 2019-05-08 LAB — COMPREHENSIVE METABOLIC PANEL WITH GFR
ALT: 13 U/L (ref 0–44)
AST: 19 U/L (ref 15–41)
Albumin: 4.9 g/dL (ref 3.5–5.0)
Alkaline Phosphatase: 123 U/L (ref 38–126)
Anion gap: 11 (ref 5–15)
BUN: 8 mg/dL (ref 6–20)
CO2: 26 mmol/L (ref 22–32)
Calcium: 9.5 mg/dL (ref 8.9–10.3)
Chloride: 102 mmol/L (ref 98–111)
Creatinine, Ser: 0.62 mg/dL (ref 0.44–1.00)
GFR calc Af Amer: >60 mL/min (ref >60)
GFR calc non Af Amer: >60 mL/min (ref >60)
Glucose, Bld: 105 mg/dL — ABNORMAL HIGH (ref 70–99)
Potassium: 4 mmol/L (ref 3.5–5.1)
Sodium: 139 mmol/L (ref 135–145)
Total Bilirubin: 0.5 mg/dL (ref 0.3–1.2)
Total Protein: 8.6 g/dL — ABNORMAL HIGH (ref 6.5–8.1)

## 2019-05-08 LAB — URINALYSIS, ROUTINE W REFLEX MICROSCOPIC
Bilirubin Urine: NEGATIVE
Glucose, UA: NEGATIVE mg/dL
Hgb urine dipstick: NEGATIVE
Ketones, ur: NEGATIVE mg/dL
Leukocytes,Ua: NEGATIVE
Nitrite: NEGATIVE
Protein, ur: NEGATIVE mg/dL
Specific Gravity, Urine: 1.004 — ABNORMAL LOW (ref 1.005–1.030)
pH: 6 (ref 5.0–8.0)

## 2019-05-08 LAB — CBC
HCT: 42.8 % (ref 36.0–46.0)
Hemoglobin: 14.1 g/dL (ref 12.0–15.0)
MCH: 31 pg (ref 26.0–34.0)
MCHC: 32.9 g/dL (ref 30.0–36.0)
MCV: 94.1 fL (ref 80.0–100.0)
Platelets: 333 10*3/uL (ref 150–400)
RBC: 4.55 MIL/uL (ref 3.87–5.11)
RDW: 11.8 % (ref 11.5–15.5)
WBC: 7.9 10*3/uL (ref 4.0–10.5)
nRBC: 0 % (ref 0.0–0.2)

## 2019-05-08 LAB — I-STAT BETA HCG BLOOD, ED (MC, WL, AP ONLY): I-stat hCG, quantitative: <5 m[IU]/mL (ref <5)

## 2019-05-08 LAB — LIPASE, BLOOD: Lipase: 25 U/L (ref 11–51)

## 2019-05-08 MED ORDER — FAMOTIDINE IN NACL 20-0.9 MG/50ML-% IV SOLN
20.0000 mg | INTRAVENOUS | Status: AC
  Administered 2019-05-08: 20 mg via INTRAVENOUS
  Filled 2019-05-08: qty 50

## 2019-05-08 MED ORDER — SUCRALFATE 1 G PO TABS
1.0000 g | ORAL_TABLET | Freq: Once | ORAL | Status: AC
  Administered 2019-05-08: 1 g via ORAL
  Filled 2019-05-08: qty 1

## 2019-05-08 MED ORDER — ONDANSETRON HCL 4 MG/2ML IJ SOLN
4.0000 mg | Freq: Once | INTRAMUSCULAR | Status: AC
  Administered 2019-05-08: 4 mg via INTRAVENOUS
  Filled 2019-05-08: qty 2

## 2019-05-08 MED ORDER — SODIUM CHLORIDE 0.9 % IV BOLUS
1000.0000 mL | Freq: Once | INTRAVENOUS | Status: AC
  Administered 2019-05-08: 23:00:00 1000 mL via INTRAVENOUS

## 2019-05-08 MED ORDER — SODIUM CHLORIDE 0.9% FLUSH
3.0000 mL | Freq: Once | INTRAVENOUS | Status: AC
  Administered 2019-05-08: 3 mL via INTRAVENOUS

## 2019-05-08 NOTE — ED Provider Notes (Signed)
[facility] DE[facility] Provider Note   CSN: [id number] Arrival date & time: 05/08/19  1842     History Chief Complaint  Patient presents with  . Abdominal Pain    Valerie Evans is a 33 y.o. female.  The history is provided by the patient and medical records.    33 y.o. F with hx of anxiety, chronic headaches, depression, Lupus, thyroid disease, presenting to the ED for left upper abdominal pain.  Patient states last week she was having some rib pain so saw her PCP and was diagnosed with costochondritis, put on motrin 3x daily.  States she has been taking it without much relief of her pain and now she has developed abdominal pain.  States it is a sharp, stabbing, and almost gnawing pain in her left upper abdominal area.  She has had a hard time eating due to the pain.  She tried to go to dinner tonight with a friend and threw up all of her food.  She has not had any diarrhea.  No fever/chills/sweats.  She has also been having some joint pain in her knees for the past few months.  She is on Plaquenil for her lupus, no steroids or other maintenance medications.  No other medications prior to arrival.  Past Medical History:  Diagnosis Date  . Anxiety   . Chronic headache   . Depression   . Lupus ([place])   . Thyroid disease   . UTI (lower urinary tract infection)     Patient Active Problem List   Diagnosis Date Noted  . MDD (major depressive disorder) 02/18/2018  . PTSD (post-traumatic stress disorder) 02/18/2018  . Panic attack as reaction to stress 03/17/2013  . Generalized anxiety disorder 03/17/2013  . Sinusitis, chronic 03/09/2013  . Asthma, chronic 03/09/2013  . Benign paroxysmal positional vertigo 03/09/2013    History reviewed. No pertinent surgical history.   OB History   No obstetric history on file.     Family History  Problem Relation Age of Onset  . Heart disease Father   . Depression Father   . Hypertension Mother   . Depression  Mother   . Cancer Maternal Aunt     Social History   Tobacco Use  . Smoking status: Never Smoker  . Smokeless tobacco: Never Used  Substance Use Topics  . Alcohol use: No    Comment: rarely  . Drug use: No    Home Medications Prior to Admission medications   Medication Sig Start Date End Date Taking? Authorizing Provider  ARIPiprazole (ABILIFY) 5 MG tablet TAKE 1 TABLET BY MOUTH EVERY DAY 12/31/18   Hurst, Helene Kelp T, PA-C  clonazePAM (KLONOPIN) 1 MG tablet Take 1 tablet (1 mg total) by mouth 3 (three) times daily as needed for anxiety. TAKE 1 TABLET BY MOUTH TWICE A DAY AND TAKE 1/2 TABLET EVERY AFTERNOON AS NEEDED FOR ANXIETY 05/06/19   Hurst, Helene Kelp T, PA-C  drospirenone-ethinyl estradiol (SYEDA) 3-0.03 MG tablet Take 1 tablet by mouth daily.    [provider]  FLUoxetine (PROZAC) 40 MG capsule TAKE 2 CAPSULES BY MOUTH DAILY 04/06/19   Donnal Moat T, PA-C  gabapentin (NEURONTIN) 300 MG capsule 1 po qhs for 4 days, then 1 po bid. 05/06/19   Addison Lank, PA-C  hydroxychloroquine (PLAQUENIL) 200 MG tablet Take 100 mg by mouth daily.    [provider]  levothyroxine (SYNTHROID, LEVOTHROID) 25 MCG tablet Take 25 mcg by mouth daily before breakfast.  [provider]  modafinil (PROVIGIL) 200 MG tablet Take 1 tablet (200 mg total) by mouth daily. 05/06/19   Cherie Ouch, PA-C  Multiple Vitamin (MULTIVITAMIN WITH MINERALS) TABS tablet Take 1 tablet by mouth daily.    [provider]  ondansetron (ZOFRAN-ODT) 8 MG disintegrating tablet Take 1 tablet (8 mg total) by mouth every 8 (eight) hours as needed for nausea. 06/22/17   Elvina Sidle, MD  promethazine (PHENERGAN) 25 MG tablet Take 1 tablet (25 mg total) by mouth every 6 (six) hours as needed for nausea or vomiting. 12/05/14   Pollina, Canary Brim, MD  traZODone (DESYREL) 100 MG tablet Take 1 tablet (100 mg total) by mouth at bedtime as needed for sleep. 12/01/18   Cherie Ouch, PA-C     Allergies    Patient has no known allergies.  Review of Systems   Review of Systems  Gastrointestinal: Positive for abdominal pain, nausea and vomiting.  All other systems reviewed and are negative.   Physical Exam Updated Vital Signs BP 113/87 (BP Location: Left Arm)   Pulse 84   Temp 98.3 F (36.8 C) (Oral)   Resp 17   LMP 05/01/2019   SpO2 97%   Physical Exam Vitals and nursing note reviewed.  Constitutional:      Appearance: She is well-developed.  HENT:     Head: Normocephalic and atraumatic.  Eyes:     Conjunctiva/sclera: Conjunctivae normal.     Pupils: Pupils are equal, round, and reactive to light.  Cardiovascular:     Rate and Rhythm: Normal rate and regular rhythm.     Heart sounds: Normal heart sounds.  Pulmonary:     Effort: Pulmonary effort is normal.     Breath sounds: Normal breath sounds.  Abdominal:     General: Bowel sounds are normal.     Palpations: Abdomen is soft.     Tenderness: There is no abdominal tenderness.     Comments: Endorses stabbing pain in LUQ but no tenderness, rebound, or guarding  Musculoskeletal:        General: Normal range of motion.     Cervical back: Normal range of motion.  Skin:    General: Skin is warm and dry.  Neurological:     Mental Status: She is alert and oriented to person, place, and time.     ED Results / Procedures / Treatments   Labs (all labs ordered are listed, but only abnormal results are displayed) Labs Reviewed  COMPREHENSIVE METABOLIC PANEL - Abnormal; Notable for the following components:      Result Value   Glucose, Bld 105 (*)    Total Protein 8.6 (*)    All other components within normal limits  URINALYSIS, ROUTINE W REFLEX MICROSCOPIC - Abnormal; Notable for the following components:   Color, Urine STRAW (*)    Specific Gravity, Urine 1.004 (*)    All other components within normal limits  LIPASE, BLOOD  CBC  I-STAT BETA HCG BLOOD, ED ([facility], [facility], [facility] ONLY)     EKG None  Radiology No results found.  Procedures Procedures (including critical care time)  Medications Ordered in ED Medications  sodium chloride flush (NS) 0.9 % injection 3 mL (3 mLs Intravenous Given 05/08/19 2311)  sodium chloride 0.9 % bolus 1,000 mL (0 mLs Intravenous Stopped 05/09/19 0027)  ondansetron (ZOFRAN) injection 4 mg (4 mg Intravenous Given 05/08/19 2303)  famotidine (PEPCID) IVPB 20 mg premix (0 mg Intravenous Stopped 05/08/19 2345)  sucralfate (CARAFATE)  tablet 1 g (1 g Oral Given 05/08/19 2303)    ED Course  I have reviewed the triage vital signs and the nursing notes.  Pertinent labs & imaging results that were available during my care of the patient were reviewed by me and considered in my medical decision making (see chart for details).    MDM Rules/Calculators/A&P  33 year old female here with left upper abdominal pain.  Has history of lupus and has been having joint pain and recently diagnosed with costochondritis by PCP.  Since started on ibuprofen 3 times daily she has had left upper abdominal pain.  She is not currently on any steroids, takes chronic Plaquenil.  She did have some nausea and vomiting earlier today but no diarrhea.  No fever or chills.  She is afebrile and nontoxic in appearance here.  Abdomen is overall soft.  She endorses pain in the left upper abdomen but there is no significant tenderness.  Bowel sounds are normal.  Her screening labs are overall reassuring.  Suspect she likely has NSAID induced gastritis.  She was treated here with IV fluids, Pepcid, Zofran, and Carafate with good improvement.  She is tolerating oral fluids well.  Will plan to d/c home with symptomatic control and dietary modification.  Will need close follow-up with PCP.   Friend at bedside did request information for new rheumatologist, give list for local practices.  She may return here for any new or acute changes.  Final Clinical Impression(s) / ED  Diagnoses Final diagnoses:  Left upper quadrant abdominal pain  Other acute gastritis without hemorrhage    Rx / DC Orders ED Discharge Orders         Ordered    famotidine (PEPCID) 20 MG tablet  2 times daily     05/09/19 0031    ondansetron (ZOFRAN ODT) 4 MG disintegrating tablet  Every 8 hours PRN     05/09/19 0031           Garlon HatchetSanders, Gaudencio Chesnut M, PA-C 05/09/19 [id number]    Mancel BaleWentz, Elliott, MD 05/09/19 1249

## 2019-05-08 NOTE — ED Triage Notes (Signed)
Patient arrived with complaints of left upper quadrant pain, nausea, and vomiting over the last two week. Reports having a history of lupus and two weeks ago diagnosed with costochondritis. Taking Ibuprofen with no relief. Pain increases with deep breath, cough, or sneeze.

## 2019-05-08 NOTE — ED Notes (Signed)
Pt instructed to change into a gown. Pt given warm blankets.

## 2019-05-09 MED ORDER — ONDANSETRON 4 MG PO TBDP: 4.0000 mg | ORAL_TABLET | Freq: Three times a day (TID) | ORAL | 0 refills | Status: DC | PRN

## 2019-05-09 MED ORDER — FAMOTIDINE 20 MG PO TABS: 20.0000 mg | ORAL_TABLET | Freq: Two times a day (BID) | ORAL | 0 refills | Status: DC

## 2019-05-09 NOTE — Discharge Instructions (Signed)
Labs were very reassuring today.  We suspect you have an NSAID induced gastritis from the motrin/ibuprofen. Take the prescribed medication as directed.  Try to limit spicy/acidic foods as this can exacerbate symptoms.  Make sure to stay well hydrated and try to eat regularly. Follow-up with your primary care doctor. Return to the ED for new or worsening symptoms.

## 2019-05-12 ENCOUNTER — Other Ambulatory Visit: Payer: Self-pay | Admitting: Physician Assistant

## 2019-05-31 ENCOUNTER — Ambulatory Visit: Payer: BLUE CROSS/BLUE SHIELD | Admitting: Addiction (Substance Use Disorder)

## 2019-06-17 ENCOUNTER — Ambulatory Visit (INDEPENDENT_AMBULATORY_CARE_PROVIDER_SITE_OTHER): Payer: 59 | Admitting: Physician Assistant

## 2019-06-17 ENCOUNTER — Other Ambulatory Visit: Payer: Self-pay

## 2019-06-17 ENCOUNTER — Encounter: Payer: Self-pay | Admitting: Physician Assistant

## 2019-06-17 DIAGNOSIS — F331 Major depressive disorder, recurrent, moderate: Secondary | ICD-10-CM

## 2019-06-17 DIAGNOSIS — F4310 Post-traumatic stress disorder, unspecified: Secondary | ICD-10-CM | POA: Diagnosis not present

## 2019-06-17 DIAGNOSIS — F430 Acute stress reaction: Secondary | ICD-10-CM

## 2019-06-17 DIAGNOSIS — F410 Panic disorder [episodic paroxysmal anxiety] without agoraphobia: Secondary | ICD-10-CM

## 2019-06-17 DIAGNOSIS — F411 Generalized anxiety disorder: Secondary | ICD-10-CM | POA: Diagnosis not present

## 2019-06-17 DIAGNOSIS — F900 Attention-deficit hyperactivity disorder, predominantly inattentive type: Secondary | ICD-10-CM

## 2019-06-17 MED ORDER — ARIPIPRAZOLE 5 MG PO TABS: 7.5000 mg | ORAL_TABLET | Freq: Every day | ORAL | 0 refills | Status: DC

## 2019-06-17 MED ORDER — GABAPENTIN 300 MG PO CAPS: 300.0000 mg | ORAL_CAPSULE | Freq: Two times a day (BID) | ORAL | 1 refills | Status: DC

## 2019-06-17 MED ORDER — CLONAZEPAM 1 MG PO TABS: ORAL_TABLET | ORAL | 1 refills | Status: DC

## 2019-06-17 NOTE — Progress Notes (Signed)
[facility]  Patient ID: Valerie Evans,  MRN: [id number]  PCP: Valerie Evans., PA-C  Date of Evaluation: 06/17/2019 Time spent:30 minutes  Chief Complaint:  Chief Complaint    Anxiety; Depression; Insomnia      HISTORY/CURRENT STATUS: HPI Not doing well.  Has been under a lot of stress these past several days. An Ex of hers OD'ed and died. She was with him for about 3 years a long time ago.  "It is not like I was still in love with him or anything but it is just strange hearing that something like that happened to him."  Her best friend called and told her about her ex, but now won't speak to her."It's like she broke up with me.  I don't know why."   Also she's realized she's broke.  Not working and not sure what she is going to do. Has interviewed at a pediatrician's office, but not sure she can do the job even if she gets it.  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  Feels depressed, lacks energy or motivation.  In the past the modafinil had helped that.  She does not clean up around the house.  She does not even like to bathe or brush her teeth.  She does not really enjoy anything.  Cries at times for no reason.  She wants to sleep too much but then sometimes has trouble falling asleep at night.  Denies suicidal or homicidal thoughts.  Patient denies increased energy with decreased need for sleep, no increased talkativeness, no racing thoughts, no impulsivity or risky behaviors, no increased spending, no increased libido, no grandiosity.  No irritability.  No hallucinations.  Anxiety is still a huge problem for her.  On a daily basis, she has anxiety to the point that she feels frozen in place at times.  She also has panic attacks almost daily.  The Klonopin does help but just not long enough.  The gabapentin has helped a little bit.  When she does have a panic attack it can last a couple of hours.  States she feels  miserable during that time waiting for it to go away.  Denies dizziness, syncope, seizures, numbness, tingling, tremor, tics, unsteady gait, slurred speech, confusion.   Has muscle and joint pain, sometimes with stiffness. No dystonia.  No recent changes by her rheumatologist for the lupus.  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  Individual Medical History/ Review of Systems: Changes? :No    Past medications for mental health diagnoses include: Cymbalta, Zoloft, Wellbutrin, Xanax, trazodone, Lexapro, Elavil, Sonata Klonopin, Gabapentin, Abilify   Allergies: Patient has no known allergies.  Current Medications:  Current Outpatient Medications:  .  ARIPiprazole (ABILIFY) 5 MG tablet, Take 1.5 tablets (7.5 mg total) by mouth daily., Disp: 90 tablet, Rfl: 0 .  clonazePAM (KLONOPIN) 1 MG tablet, 1 po tid prn and may take 1/2 po mid-day on occas prn., Disp: 105 tablet, Rfl: 1 .  FLUoxetine (PROZAC) 40 MG capsule, TAKE 2 CAPSULES BY MOUTH DAILY (Patient taking differently: Take 40 mg by mouth 2 (two) times daily. ), Disp: 180 capsule, Rfl: 1 .  gabapentin (NEURONTIN) 300 MG capsule, Take 1 capsule (300 mg total) by mouth 2 (two) times daily., Disp: 60 capsule, Rfl:  1 .  hydroxychloroquine (PLAQUENIL) 200 MG tablet, Take 100 mg by mouth daily., Disp: , Rfl:  .  levothyroxine (SYNTHROID, LEVOTHROID) 25 MCG tablet, Take 25 mcg by mouth daily before breakfast., Disp: , Rfl:  .  modafinil (PROVIGIL) 200 MG tablet, Take 1 tablet (200 mg total) by mouth daily., Disp: 30 tablet, Rfl: 1 .  Multiple Vitamin (MULTIVITAMIN WITH MINERALS) TABS tablet, Take 1 tablet by mouth daily., Disp: , Rfl:  .  ondansetron (ZOFRAN ODT) 4 MG disintegrating tablet, Take 1 tablet (4 mg total) by mouth every 8 (eight) hours as needed for nausea., Disp: 10 tablet, Rfl: 0 .   promethazine (PHENERGAN) 25 MG tablet, Take 1 tablet (25 mg total) by mouth every 6 (six) hours as needed for nausea or vomiting., Disp: 30 tablet, Rfl: 0 .  traZODone (DESYREL) 100 MG tablet, Take 1 tablet (100 mg total) by mouth at bedtime as needed for sleep., Disp: 180 tablet, Rfl: 1 Medication Side Effects: none  Family Medical/ Social History: Changes? No  MENTAL HEALTH EXAM:  There were no vitals taken for this visit.There is no height or weight on file to calculate BMI.  General Appearance: Casual, Neat and Well Groomed  Eye Contact:  Good  Speech:  Clear and Coherent  Volume:  Normal  Mood:  Depressed  Affect:  Depressed  Thought Process:  Irrelevant and Descriptions of Associations: Intact  Orientation:  Full (Time, Place, and Person)  Thought Content: Logical   Suicidal Thoughts:  No  Homicidal Thoughts:  No  Memory:  WNL  Judgement:  Good  Insight:  Good  Psychomotor Activity:  Normal  Concentration:  Concentration: Good  Recall:  Good  Fund of Knowledge: Good  Language: Good  Assets:  Desire for Improvement  ADL's:  Intact  Cognition: WNL  Prognosis:  Good    DIAGNOSES:    ICD-10-CM   1. Major depressive disorder, recurrent episode, moderate (HCC)  F33.1   2. PTSD (post-traumatic stress disorder)  F43.10   3. Generalized anxiety disorder  F41.1   4. Panic attack as reaction to stress  F41.0    F43.0   5. Attention deficit hyperactivity disorder (ADHD), predominantly inattentive type  F90.0     Receiving Psychotherapy: No    RECOMMENDATIONS:  I spent 30 minutes with her. PDMP was reviewed. We discussed the anxiety and the possible dementia related to long-term high-dose benzodiazepine use.  She understands and accepts that as a risk.  I agree that the benefit outweighs the risk at this point. Since the depression is worse and she is on maximum dose of Prozac I think it would be a good idea to increase the Abilify.  She understands and agrees. Sleep  hygiene was discussed. Therapeutic lifestyle changes were discussed. Increase Abilify to 7.5 mg every morning. Continue Klonopin 1 mg, 1 p.o. 3 times daily as needed with an extra 1/2 pill during the day as needed.  Try to keep the dose as low as possible. Continue Prozac 40 mg, 2 p.o. daily. Continue gabapentin 300 mg 1 p.o. twice daily. Continue modafinil 200 mg every morning. Continue trazodone 100 mg nightly as needed sleep. Recommend therapy. Return in 4 weeks.  Donnal Moat, PA-C

## 2019-06-20 ENCOUNTER — Ambulatory Visit (INDEPENDENT_AMBULATORY_CARE_PROVIDER_SITE_OTHER): Payer: 59 | Admitting: Physician Assistant

## 2019-06-20 ENCOUNTER — Other Ambulatory Visit: Payer: Self-pay

## 2019-06-20 ENCOUNTER — Encounter: Payer: Self-pay | Admitting: Physician Assistant

## 2019-06-20 VITALS — BP 100/60 | HR 78 | Temp 98.4°F | Resp 16 | Ht 60.0 in | Wt 97.2 lb

## 2019-06-20 DIAGNOSIS — F339 Major depressive disorder, recurrent, unspecified: Secondary | ICD-10-CM

## 2019-06-20 DIAGNOSIS — R300 Dysuria: Secondary | ICD-10-CM | POA: Diagnosis not present

## 2019-06-20 DIAGNOSIS — E039 Hypothyroidism, unspecified: Secondary | ICD-10-CM

## 2019-06-20 LAB — POCT URINALYSIS DIPSTICK
Bilirubin, UA: NEGATIVE
Glucose, UA: NEGATIVE
Ketones, UA: NEGATIVE
Nitrite, UA: NEGATIVE
Protein, UA: NEGATIVE
Spec Grav, UA: 1.01 (ref 1.010–1.025)
Urobilinogen, UA: 0.2 E.U./dL
pH, UA: 6.5 (ref 5.0–8.0)

## 2019-06-20 MED ORDER — CEPHALEXIN 500 MG PO CAPS: 500.0000 mg | ORAL_CAPSULE | Freq: Two times a day (BID) | ORAL | 0 refills | Status: AC

## 2019-06-20 NOTE — Progress Notes (Signed)
Patient presents to clinic today to establish care.  Acute Concerns: Patient endorses 2-3 days of dysuria, urinary urgency, frequency with incomplete bladder emptying. Denies fever, chills, nausea or vomiting. Denies abdominal pain or flank pain.   Chronic Issues: Hypothyroidism -- longstanding. Currently on a regimen of levothyroxine 25 mcg daily. Endorses taking as directed. Will get records from previous PCP to review, including date and result of last TSH.  Depression -- Major depressive disorder with anxiety. Followed by Psychiatry, with next follow-up in 4 weeks. Also sees a Veterinary surgeon. Is currently on a regimen of Trazodone, Provigil, Gabapentin, Fluoxetine, Abilify and Klonopin. Denies SI/HI.  Health Maintenance: Immunizations -- Declines flu shot PAP -- Denies history of abnormal PAP smear. Last 1 year ago -- normal per patient.   Past Medical History:  Diagnosis Date  . Anxiety   . Chronic headache   . Depression   . Lupus (HCC)   . Thyroid disease   . UTI (lower urinary tract infection)     No past surgical history on file.  Current Outpatient Medications on File Prior to Visit  Medication Sig Dispense Refill  . ARIPiprazole (ABILIFY) 5 MG tablet Take 1.5 tablets (7.5 mg total) by mouth daily. 90 tablet 0  . clonazePAM (KLONOPIN) 1 MG tablet 1 po tid prn and may take 1/2 po mid-day on occas prn. 105 tablet 1  . FLUoxetine (PROZAC) 40 MG capsule TAKE 2 CAPSULES BY MOUTH DAILY (Patient taking differently: Take 40 mg by mouth 2 (two) times daily. ) 180 capsule 1  . gabapentin (NEURONTIN) 300 MG capsule Take 1 capsule (300 mg total) by mouth 2 (two) times daily. 60 capsule 1  . hydroxychloroquine (PLAQUENIL) 200 MG tablet Take 100 mg by mouth daily.    Marland Kitchen levothyroxine (SYNTHROID, LEVOTHROID) 25 MCG tablet Take 25 mcg by mouth daily before breakfast.    . modafinil (PROVIGIL) 200 MG tablet Take 1 tablet (200 mg total) by mouth daily. 30 tablet 1  . Multiple Vitamin  (MULTIVITAMIN WITH MINERALS) TABS tablet Take 1 tablet by mouth daily.    . ondansetron (ZOFRAN ODT) 4 MG disintegrating tablet Take 1 tablet (4 mg total) by mouth every 8 (eight) hours as needed for nausea. 10 tablet 0  . promethazine (PHENERGAN) 25 MG tablet Take 1 tablet (25 mg total) by mouth every 6 (six) hours as needed for nausea or vomiting. 30 tablet 0  . traZODone (DESYREL) 100 MG tablet Take 1 tablet (100 mg total) by mouth at bedtime as needed for sleep. 180 tablet 1   No current facility-administered medications on file prior to visit.    No Known Allergies  Family History  Problem Relation Age of Onset  . Heart disease Father   . Depression Father   . Hypertension Mother   . Depression Mother   . Cancer Maternal Aunt     Social History   Socioeconomic History  . Marital status: Married    Spouse name: Not on file  . Number of children: Not on file  . Years of education: Not on file  . Highest education level: Not on file  Occupational History  . Not on file  Tobacco Use  . Smoking status: Never Smoker  . Smokeless tobacco: Never Used  Substance and Sexual Activity  . Alcohol use: No    Comment: rarely  . Drug use: No  . Sexual activity: Yes    Birth control/protection: Pill  Other Topics Concern  . Not on file  Social History Narrative  . Not on file   Social Determinants of Health   Financial Resource Strain:   . Difficulty of Paying Living Expenses: Not on file  Food Insecurity:   . Worried About Charity fundraiser in the Last Year: Not on file  . Ran Out of Food in the Last Year: Not on file  Transportation Needs:   . Lack of Transportation (Medical): Not on file  . Lack of Transportation (Non-Medical): Not on file  Physical Activity:   . Days of Exercise per Week: Not on file  . Minutes of Exercise per Session: Not on file  Stress:   . Feeling of Stress : Not on file  Social Connections:   . Frequency of Communication with Friends and  Family: Not on file  . Frequency of Social Gatherings with Friends and Family: Not on file  . Attends Religious Services: Not on file  . Active Member of Clubs or Organizations: Not on file  . Attends Archivist Meetings: Not on file  . Marital Status: Not on file  Intimate Partner Violence:   . Fear of Current or Ex-Partner: Not on file  . Emotionally Abused: Not on file  . Physically Abused: Not on file  . Sexually Abused: Not on file   Review of Systems  Constitutional: Negative for fever and weight loss.  HENT: Negative for ear discharge, ear pain, hearing loss and tinnitus.   Eyes: Negative for blurred vision, double vision, photophobia and pain.  Respiratory: Negative for cough and shortness of breath.   Cardiovascular: Negative for chest pain and palpitations.  Gastrointestinal: Negative for abdominal pain, blood in stool, constipation, diarrhea, heartburn, melena, nausea and vomiting.  Genitourinary: Positive for dysuria, frequency and urgency. Negative for flank pain and hematuria.  Musculoskeletal: Negative for falls.  Neurological: Negative for dizziness, loss of consciousness and headaches.  Endo/Heme/Allergies: Negative for environmental allergies.  Psychiatric/Behavioral: Positive for depression. Negative for hallucinations, substance abuse and suicidal ideas. The patient is not nervous/anxious and does not have insomnia.   \ Resp 16   Ht 5' (1.524 m)   Wt 97 lb 3.2 oz (44.1 kg)   BMI 18.98 kg/m   Physical Exam Vitals reviewed.  Constitutional:      Appearance: Normal appearance.  HENT:     Head: Normocephalic and atraumatic.     Mouth/Throat:     Mouth: Mucous membranes are moist.  Eyes:     Pupils: Pupils are equal, round, and reactive to light.  Cardiovascular:     Rate and Rhythm: Normal rate and regular rhythm.     Pulses: Normal pulses.     Heart sounds: Normal heart sounds.  Abdominal:     Tenderness: There is no right CVA tenderness or  left CVA tenderness.  Musculoskeletal:     Cervical back: Neck supple.  Neurological:     General: No focal deficit present.     Mental Status: She is alert and oriented to person, place, and time.     Recent Results (from the past 2160 hour(s))  Urinalysis, Routine w reflex microscopic     Status: Abnormal   Collection Time: 05/08/19  7:51 PM  Result Value Ref Range   Color, Urine STRAW (A) YELLOW   APPearance CLEAR CLEAR   Specific Gravity, Urine 1.004 (L) 1.005 - 1.030   pH 6.0 5.0 - 8.0   Glucose, UA NEGATIVE NEGATIVE mg/dL   Hgb urine dipstick NEGATIVE NEGATIVE   Bilirubin Urine NEGATIVE  NEGATIVE   Ketones, ur NEGATIVE NEGATIVE mg/dL   Protein, ur NEGATIVE NEGATIVE mg/dL   Nitrite NEGATIVE NEGATIVE   Leukocytes,Ua NEGATIVE NEGATIVE    Comment: Performed at [facility], [address], [place], [place] [address]  Lipase, blood     Status: None   Collection Time: 05/08/19  7:59 PM  Result Value Ref Range   Lipase 25 11 - 51 U/L    Comment: Performed at [facility], [address], [place], [place] [address]  Comprehensive metabolic panel     Status: Abnormal   Collection Time: 05/08/19  7:59 PM  Result Value Ref Range   Sodium 139 135 - 145 mmol/L   Potassium 4.0 3.5 - 5.1 mmol/L   Chloride 102 98 - 111 mmol/L   CO2 26 22 - 32 mmol/L   Glucose, Bld 105 (H) 70 - 99 mg/dL   BUN 8 6 - 20 mg/dL   Creatinine, Ser 6.64 0.44 - 1.00 mg/dL   Calcium 9.5 8.9 - 40.3 mg/dL   Total Protein 8.6 (H) 6.5 - 8.1 g/dL   Albumin 4.9 3.5 - 5.0 g/dL   AST 19 15 - 41 U/L   ALT 13 0 - 44 U/L   Alkaline Phosphatase 123 38 - 126 U/L   Total Bilirubin 0.5 0.3 - 1.2 mg/dL   GFR calc non Af Amer >60 >60 mL/min   GFR calc Af Amer >60 >60 mL/min   Anion gap 11 5 - 15    Comment: Performed at [facility], [address], [place], [place] [address]  CBC     Status: None   Collection Time: 05/08/19  7:59 PM  Result Value Ref Range    WBC 7.9 4.0 - 10.5 K/uL   RBC 4.55 3.87 - 5.11 MIL/uL   Hemoglobin 14.1 12.0 - 15.0 g/dL   HCT 95.6 38.7 - 56.4 %   MCV 94.1 80.0 - 100.0 fL   MCH 31.0 26.0 - 34.0 pg   MCHC 32.9 30.0 - 36.0 g/dL   RDW 33.2 95.1 - 88.4 %   Platelets 333 150 - 400 K/uL   nRBC 0.0 0.0 - 0.2 %    Comment: Performed at [facility], [address] [place], [place] [address]  I-Stat beta hCG blood, ED     Status: None   Collection Time: 05/08/19  8:04 PM  Result Value Ref Range   I-stat hCG, quantitative <5.0 <5 mIU/mL   Comment 3            Comment:   GEST. AGE      CONC.  (mIU/mL)   <=1 WEEK        5 - 50     2 WEEKS       50 - 500     3 WEEKS       100 - 10,000     4 WEEKS     1,000 - 30,000        FEMALE AND NON-PREGNANT FEMALE:     LESS THAN 5 mIU/mL     Assessment/Plan: 1. Dysuria Urine dip with LE and blood. Classic symptoms. Will send urine for culture. Begin empiric treatment with Keflex 500 mg BID x 7 days. Supportive measures and OTC medications reviewed.  - POCT Urinalysis Dipstick - cephALEXin (KEFLEX) 500 MG capsule; Take 1 capsule (500 mg total) by mouth 2 (two) times daily for 7 days.  Dispense: 14 capsule; Refill: 0 -  Urine Culture  2. Episode of recurrent major depressive disorder, unspecified depression episode severity (HCC) Continue management per Psychiatry.  3. Acquired hypothyroidism Will get records to assess last TSH. Continue current regimen for now.     This visit occurred during the SARS-CoV-2 public health emergency.  Safety protocols were in place, including screening questions prior to the visit, additional usage of staff PPE, and extensive cleaning of exam room while observing appropriate contact time as indicated for disinfecting solutions.     Piedad Climes, PA-C

## 2019-06-20 NOTE — Patient Instructions (Signed)
Your symptoms are consistent with a bladder infection, also called acute cystitis. Please take your antibiotic (Keflex) as directed until all pills are gone.  Stay very well hydrated.  Consider a daily probiotic (Align, Culturelle, or Activia) to help prevent stomach upset caused by the antibiotic.  Taking a probiotic daily may also help prevent recurrent UTIs.  Also consider taking AZO (Phenazopyridine) tablets to help decrease pain with urination.  I will call you with your urine testing results.  We will change antibiotics if indicated.  Call or return to clinic if symptoms are not resolved by completion of antibiotic.   In regards to the Fibromyalgia -- start gentle stretching (yoga and tai chi are very beneficial), water aerobics. A little physical activity will actually help with symptoms in the long run. If we want to adjust medications we will need to coordinate with your Psychiatrist. Some options to consider would be adjusting Gabapentin dose or switching your Fluoxetine to Duloxetine (Cymbalta).   Urinary Tract Infection A urinary tract infection (UTI) can occur any place along the urinary tract. The tract includes the kidneys, ureters, bladder, and urethra. A type of germ called bacteria often causes a UTI. UTIs are often helped with antibiotic medicine.  HOME CARE   If given, take antibiotics as told by your doctor. Finish them even if you start to feel better.  Drink enough fluids to keep your pee (urine) clear or pale yellow.  Avoid tea, drinks with caffeine, and bubbly (carbonated) drinks.  Pee often. Avoid holding your pee in for a long time.  Pee before and after having sex (intercourse).  Wipe from front to back after you poop (bowel movement) if you are a woman. Use each tissue only once. GET HELP RIGHT AWAY IF:   You have back pain.  You have lower belly (abdominal) pain.  You have chills.  You feel sick to your stomach (nauseous).  You throw up (vomit).  Your  burning or discomfort with peeing does not go away.  You have a fever.  Your symptoms are not better in 3 days. MAKE SURE YOU:   Understand these instructions.  Will watch your condition.  Will get help right away if you are not doing well or get worse. Document Released: 10/29/2007 Document Revised: 02/04/2012 Document Reviewed: 12/11/2011 [facility] Patient Information 2015 [place], [place]. This information is not intended to replace advice given to you by your health care provider. Make sure you discuss any questions you have with your health care provider.

## 2019-06-22 LAB — URINE CULTURE
MICRO NUMBER:: 10077282
SPECIMEN QUALITY:: ADEQUATE

## 2019-07-11 ENCOUNTER — Other Ambulatory Visit: Payer: Self-pay | Admitting: Physician Assistant

## 2019-07-11 NOTE — Telephone Encounter (Signed)
Apt 02/19

## 2019-07-15 ENCOUNTER — Ambulatory Visit (INDEPENDENT_AMBULATORY_CARE_PROVIDER_SITE_OTHER): Payer: 59 | Admitting: Physician Assistant

## 2019-07-15 ENCOUNTER — Encounter: Payer: Self-pay | Admitting: Physician Assistant

## 2019-07-15 DIAGNOSIS — G8929 Other chronic pain: Secondary | ICD-10-CM | POA: Diagnosis not present

## 2019-07-15 DIAGNOSIS — F331 Major depressive disorder, recurrent, moderate: Secondary | ICD-10-CM | POA: Diagnosis not present

## 2019-07-15 DIAGNOSIS — F4310 Post-traumatic stress disorder, unspecified: Secondary | ICD-10-CM

## 2019-07-15 DIAGNOSIS — F411 Generalized anxiety disorder: Secondary | ICD-10-CM | POA: Diagnosis not present

## 2019-07-15 MED ORDER — GABAPENTIN 600 MG PO TABS: 600.0000 mg | ORAL_TABLET | Freq: Two times a day (BID) | ORAL | 1 refills | Status: DC

## 2019-07-15 MED ORDER — TRAZODONE HCL 100 MG PO TABS: 100.0000 mg | ORAL_TABLET | Freq: Every evening | ORAL | 1 refills | Status: DC | PRN

## 2019-07-15 MED ORDER — ARIPIPRAZOLE 10 MG PO TABS: 10.0000 mg | ORAL_TABLET | Freq: Every day | ORAL | 1 refills | Status: DC

## 2019-07-15 NOTE — Progress Notes (Signed)
[facility] Med Check  Patient ID: Valerie Evans,  MRN: [id number]  PCP: Waldon Merl, PA-C  Date of Evaluation: 07/15/2019 Time spent:20 minutes  Chief Complaint:  Chief Complaint    Anxiety; Depression; Medication Refill     Virtual Visit via Telephone Note  I connected with patient by a video enabled telemedicine application or telephone, with their informed consent, and verified patient privacy and that I am speaking with the correct person using two identifiers.  I am private, in my office and the patient is home.  I discussed the limitations, risks, security and privacy concerns of performing an evaluation and management service by telephone and the availability of in person appointments. I also discussed with the patient that there may be a patient responsible charge related to this service. The patient expressed understanding and agreed to proceed.   I discussed the assessment and treatment plan with the patient. The patient was provided an opportunity to ask questions and all were answered. The patient agreed with the plan and demonstrated an understanding of the instructions.   The patient was advised to call back or seek an in-person evaluation if the symptoms worsen or if the condition fails to improve as anticipated.  I provided 20 minutes of non-face-to-face time during this encounter.  HISTORY/CURRENT STATUS: HPI for routine med check.  At the last visit, we started gabapentin and increased the Abilify.  She states the depression might be a tad bit better.  She is able to enjoy some things.  Energy and motivation are still low.  She does not cry easily.  Memory is good for her.  Focus and attention are good.  Denies suicidal or homicidal thoughts.  The anxiety is slightly improved.  She asks whether certain generic brands can be better than others.  I believe so and I have encouraged her to discuss with her pharmacist.  She is not sure how much the gabapentin  is helping but feels it might be a little bit.  When she saw her PCP for a urinary tract recently, he recommended the possibility of increasing the gabapentin if okay with me.  She sleeps well with the trazodone.  She is not working out of the home still.  Review of Systems  Constitutional: Positive for malaise/fatigue.  HENT: Negative.   Eyes: Negative.   Respiratory: Negative.   Cardiovascular: Positive for palpitations.       When she is really anxious.  Gastrointestinal: Negative.   Genitourinary: Negative.   Musculoskeletal: Positive for joint pain and myalgias.  Skin: Negative.   Neurological: Negative.   Endo/Heme/Allergies: Negative.   Psychiatric/Behavioral: Positive for depression. Negative for hallucinations, memory loss, substance abuse and suicidal ideas. The patient is nervous/anxious. The patient does not have insomnia.    Individual Medical History/ Review of Systems: Changes? :Yes She had a UTI since her last visit.  Past medications for mental health diagnoses include: Cymbalta, Zoloft, Wellbutrin, Xanax, trazodone, Lexapro, Elavil, Sonata Klonopin, Gabapentin  Allergies: Patient has no known allergies.  Current Medications:  Current Outpatient Medications:  .  albuterol (VENTOLIN HFA) 108 (90 Base) MCG/ACT inhaler, Inhale 1-2 puffs into the lungs every 6 (six) hours as needed for wheezing or shortness of breath., Disp: , Rfl:  .  clonazePAM (KLONOPIN) 1 MG tablet, 1 po tid prn and may take 1/2 po mid-day on occas prn., Disp: 105 tablet, Rfl: 1 .  FLUoxetine (PROZAC) 40 MG capsule, TAKE 2 CAPSULES BY MOUTH DAILY (Patient taking differently: Take 40  mg by mouth 2 (two) times daily. ), Disp: 180 capsule, Rfl: 1 .  hydroxychloroquine (PLAQUENIL) 200 MG tablet, Take 100 mg by mouth daily., Disp: , Rfl:  .  levothyroxine (SYNTHROID, LEVOTHROID) 25 MCG tablet, Take 25 mcg by mouth daily before breakfast., Disp: , Rfl:  .  modafinil (PROVIGIL) 200 MG tablet, TAKE ONE  TABLET BY MOUTH DAILY, Disp: 30 tablet, Rfl: 0 .  Multiple Vitamin (MULTIVITAMIN WITH MINERALS) TABS tablet, Take 1 tablet by mouth daily., Disp: , Rfl:  .  ondansetron (ZOFRAN ODT) 4 MG disintegrating tablet, Take 1 tablet (4 mg total) by mouth every 8 (eight) hours as needed for nausea., Disp: 10 tablet, Rfl: 0 .  promethazine (PHENERGAN) 25 MG tablet, Take 1 tablet (25 mg total) by mouth every 6 (six) hours as needed for nausea or vomiting., Disp: 30 tablet, Rfl: 0 .  traZODone (DESYREL) 100 MG tablet, Take 1 tablet (100 mg total) by mouth at bedtime as needed for sleep., Disp: 180 tablet, Rfl: 1 .  ARIPiprazole (ABILIFY) 10 MG tablet, Take 1 tablet (10 mg total) by mouth daily., Disp: 30 tablet, Rfl: 1 .  gabapentin (NEURONTIN) 600 MG tablet, Take 1 tablet (600 mg total) by mouth 2 (two) times daily., Disp: 60 tablet, Rfl: 1 Medication Side Effects: none  Family Medical/ Social History: Changes? 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   MENTAL HEALTH EXAM:  There were no vitals taken for this visit.There is no height or weight on file to calculate BMI.  General Appearance: unable to assess  Eye Contact:  unable to assess  Speech:  Clear and Coherent  Volume:  Normal  Mood:  Depressed  Affect:  Unable to assess  Thought Process:  Goal Directed and Descriptions of Associations: Intact  Orientation:  Full (Time, Place, and Person)  Thought Content: Logical   Suicidal Thoughts:  No  Homicidal Thoughts:  No  Memory:  WNL  Judgement:  Good  Insight:  Good  Psychomotor Activity:  Unable to assess  Concentration:  Concentration: Good and Attention Span: Good  Recall:  Good  Fund of Knowledge: Good  Language: Good  Assets:  Desire for Improvement  ADL's:  Intact  Cognition: WNL  Prognosis:  Good    DIAGNOSES:    ICD-10-CM   1. Major depressive disorder, recurrent episode, moderate (HCC)  F33.1   2. PTSD (post-traumatic stress disorder)  F43.10   3.  Generalized anxiety disorder  F41.1   4. Other chronic pain  G89.29     Receiving Psychotherapy: No    RECOMMENDATIONS:  I spent 20 minutes with her. PDMP was reviewed. I think it is a great idea to increase the gabapentin.  I think it will help with the anxiety as well as the chronic pain. Increase gabapentin to 300 mg p.o. every morning and 600 mg nightly for 1 week and then increase to 600 mg p.o. twice daily. Increase Abilify 10 mg every morning. Continue Klonopin 1 mg 3 times daily as needed and one half midday as needed. Continue Prozac 40 mg, 2 p.o. daily. Continue modafinil 200 mg 1 p.o. every morning Give 90 day supply on all meds months her dose is nailed down. Return in 4 to 6 weeks.  Donnal Moat, PA-C

## 2019-07-20 ENCOUNTER — Other Ambulatory Visit: Payer: Self-pay

## 2019-07-20 MED ORDER — LEVOTHYROXINE SODIUM 25 MCG PO TABS: 25.0000 ug | ORAL_TABLET | Freq: Every day | ORAL | 0 refills | Status: DC

## 2019-08-04 ENCOUNTER — Other Ambulatory Visit: Payer: Self-pay | Admitting: Physician Assistant

## 2019-08-05 ENCOUNTER — Encounter: Payer: Self-pay | Admitting: Physician Assistant

## 2019-08-05 ENCOUNTER — Ambulatory Visit (INDEPENDENT_AMBULATORY_CARE_PROVIDER_SITE_OTHER): Payer: 59 | Admitting: Physician Assistant

## 2019-08-05 ENCOUNTER — Other Ambulatory Visit: Payer: Self-pay

## 2019-08-05 VITALS — BP 90/60 | HR 84 | Temp 99.1°F | Resp 14 | Ht 60.0 in | Wt 98.0 lb

## 2019-08-05 DIAGNOSIS — R300 Dysuria: Secondary | ICD-10-CM

## 2019-08-05 DIAGNOSIS — J302 Other seasonal allergic rhinitis: Secondary | ICD-10-CM | POA: Diagnosis not present

## 2019-08-05 LAB — POCT URINALYSIS DIPSTICK
Bilirubin, UA: NEGATIVE
Glucose, UA: NEGATIVE
Ketones, UA: NEGATIVE
Leukocytes, UA: NEGATIVE
Nitrite, UA: NEGATIVE
Protein, UA: NEGATIVE
Spec Grav, UA: 1.01 (ref 1.010–1.025)
Urobilinogen, UA: 0.2 E.U./dL
pH, UA: 6 (ref 5.0–8.0)

## 2019-08-05 MED ORDER — NITROFURANTOIN MONOHYD MACRO 100 MG PO CAPS: 100.0000 mg | ORAL_CAPSULE | Freq: Two times a day (BID) | ORAL | 0 refills | Status: DC

## 2019-08-05 NOTE — Progress Notes (Signed)
Patient presents to clinic today c/o 1.5 days of urinary urgency, frequency, dysuria and left-sided low back pain. Denies hematuria, fever, chills, nausea or vomiting. Patient with history of UTI, most recently in 1/21 (E. Coli). Is taking AZO OTC to help with symptoms.   Patient also c/o 2 days of itchy, water eyes and rhinorrhea. Notes some chest tightness in AM -- has history of exercise-induced asthma. Has used her inhaler today. Denies chest congestion, cough, fatigue.   Past Medical History:  Diagnosis Date  . Anxiety   . Asthma   . Chronic headache   . Depression   . History of chickenpox   . Lupus (HCC)   . Thyroid disease   . UTI (lower urinary tract infection)     Current Outpatient Medications on File Prior to Visit  Medication Sig Dispense Refill  . albuterol (VENTOLIN HFA) 108 (90 Base) MCG/ACT inhaler Inhale 1-2 puffs into the lungs every 6 (six) hours as needed for wheezing or shortness of breath.    . ARIPiprazole (ABILIFY) 10 MG tablet Take 1 tablet (10 mg total) by mouth daily. 30 tablet 1  . clonazePAM (KLONOPIN) 1 MG tablet 1 po tid prn and may take 1/2 po mid-day on occas prn. 105 tablet 1  . FLUoxetine (PROZAC) 40 MG capsule TAKE 2 CAPSULES BY MOUTH DAILY (Patient taking differently: Take 40 mg by mouth 2 (two) times daily. ) 180 capsule 1  . gabapentin (NEURONTIN) 600 MG tablet Take 1 tablet (600 mg total) by mouth 2 (two) times daily. 60 tablet 1  . hydroxychloroquine (PLAQUENIL) 200 MG tablet Take 100 mg by mouth daily.    Marland Kitchen levothyroxine (SYNTHROID) 25 MCG tablet Take 1 tablet (25 mcg total) by mouth daily before breakfast. 90 tablet 0  . modafinil (PROVIGIL) 200 MG tablet TAKE ONE TABLET BY MOUTH DAILY 30 tablet 0  . Multiple Vitamin (MULTIVITAMIN WITH MINERALS) TABS tablet Take 1 tablet by mouth daily.    . ondansetron (ZOFRAN ODT) 4 MG disintegrating tablet Take 1 tablet (4 mg total) by mouth every 8 (eight) hours as needed for nausea. 10 tablet 0  .  promethazine (PHENERGAN) 25 MG tablet Take 1 tablet (25 mg total) by mouth every 6 (six) hours as needed for nausea or vomiting. 30 tablet 0  . traZODone (DESYREL) 100 MG tablet Take 1 tablet (100 mg total) by mouth at bedtime as needed for sleep. 180 tablet 1   No current facility-administered medications on file prior to visit.    No Known Allergies  Family History  Problem Relation Age of Onset  . Heart disease Father   . Depression Father   . Heart attack Father   . Hyperlipidemia Father   . Hypertension Father   . Hypertension Mother   . Depression Mother   . Cancer Maternal Aunt        Breast Cancer  . Healthy Sister   . Cancer Paternal Grandfather        Skin    Social History   Socioeconomic History  . Marital status: Married    Spouse name: Not on file  . Number of children: Not on file  . Years of education: Not on file  . Highest education level: Not on file  Occupational History  . Not on file  Tobacco Use  . Smoking status: Never Smoker  . Smokeless tobacco: Never Used  Substance and Sexual Activity  . Alcohol use: No    Comment: rarely  . Drug  use: No  . Sexual activity: Yes  Other Topics Concern  . Not on file  Social History Narrative  . Not on file   Social Determinants of Health   Financial Resource Strain:   . Difficulty of Paying Living Expenses:   Food Insecurity:   . Worried About Charity fundraiser in the Last Year:   . Arboriculturist in the Last Year:   Transportation Needs:   . Film/video editor (Medical):   Marland Kitchen Lack of Transportation (Non-Medical):   Physical Activity:   . Days of Exercise per Week:   . Minutes of Exercise per Session:   Stress:   . Feeling of Stress :   Social Connections:   . Frequency of Communication with Friends and Family:   . Frequency of Social Gatherings with Friends and Family:   . Attends Religious Services:   . Active Member of Clubs or Organizations:   . Attends Archivist  Meetings:   Marland Kitchen Marital Status:    Review of Systems - See HPI.  All other ROS are negative.  There were no vitals taken for this visit.  Physical Exam  Recent Results (from the past 2160 hour(s))  Urinalysis, Routine w reflex microscopic     Status: Abnormal   Collection Time: 05/08/19  7:51 PM  Result Value Ref Range   Color, Urine STRAW (A) YELLOW   APPearance CLEAR CLEAR   Specific Gravity, Urine 1.004 (L) 1.005 - 1.030   pH 6.0 5.0 - 8.0   Glucose, UA NEGATIVE NEGATIVE mg/dL   Hgb urine dipstick NEGATIVE NEGATIVE   Bilirubin Urine NEGATIVE NEGATIVE   Ketones, ur NEGATIVE NEGATIVE mg/dL   Protein, ur NEGATIVE NEGATIVE mg/dL   Nitrite NEGATIVE NEGATIVE   Leukocytes,Ua NEGATIVE NEGATIVE    Comment: Performed at [facility] [address], [place], [place] [address]  Lipase, blood     Status: None   Collection Time: 05/08/19  7:59 PM  Result Value Ref Range   Lipase 25 11 - 51 U/L    Comment: Performed at [facility], [place] [address] [place], [place] [address]  Comprehensive metabolic panel     Status: Abnormal   Collection Time: 05/08/19  7:59 PM  Result Value Ref Range   Sodium 139 135 - 145 mmol/L   Potassium 4.0 3.5 - 5.1 mmol/L   Chloride 102 98 - 111 mmol/L   CO2 26 22 - 32 mmol/L   Glucose, Bld 105 (H) 70 - 99 mg/dL   BUN 8 6 - 20 mg/dL   Creatinine, Ser 0.62 0.44 - 1.00 mg/dL   Calcium 9.5 8.9 - 10.3 mg/dL   Total Protein 8.6 (H) 6.5 - 8.1 g/dL   Albumin 4.9 3.5 - 5.0 g/dL   AST 19 15 - 41 U/L   ALT 13 0 - 44 U/L   Alkaline Phosphatase 123 38 - 126 U/L   Total Bilirubin 0.5 0.3 - 1.2 mg/dL   GFR calc non Af Amer >60 >60 mL/min   GFR calc Af Amer >60 >60 mL/min   Anion gap 11 5 - 15    Comment: Performed at [facility], [place] [address] [place], [place] [address]  CBC     Status: None   Collection Time: 05/08/19  7:59 PM  Result Value Ref Range   WBC 7.9 4.0 - 10.5 K/uL   RBC 4.55 3.87 - 5.11  MIL/uL   Hemoglobin 14.1 12.0 -  15.0 g/dL   HCT 16.1 09.6 - 04.5 %   MCV 94.1 80.0 - 100.0 fL   MCH 31.0 26.0 - 34.0 pg   MCHC 32.9 30.0 - 36.0 g/dL   RDW 40.9 81.1 - 91.4 %   Platelets 333 150 - 400 K/uL   nRBC 0.0 0.0 - 0.2 %    Comment: Performed at [facility], [address] [place], [place] [address]  I-Stat beta hCG blood, ED     Status: None   Collection Time: 05/08/19  8:04 PM  Result Value Ref Range   I-stat hCG, quantitative <5.0 <5 mIU/mL   Comment 3            Comment:   GEST. AGE      CONC.  (mIU/mL)   <=1 WEEK        5 - 50     2 WEEKS       50 - 500     3 WEEKS       100 - 10,000     4 WEEKS     1,000 - 30,000        FEMALE AND NON-PREGNANT FEMALE:     LESS THAN 5 mIU/mL   POCT Urinalysis Dipstick     Status: Abnormal   Collection Time: 06/20/19  1:23 PM  Result Value Ref Range   Color, UA yellow    Clarity, UA cloudy    Glucose, UA Negative Negative   Bilirubin, UA Negative    Ketones, UA Negative    Spec Grav, UA 1.010 1.010 - 1.025   Blood, UA 2+    pH, UA 6.5 5.0 - 8.0   Protein, UA Negative Negative   Urobilinogen, UA 0.2 0.2 or 1.0 E.U./dL   Nitrite, UA Negative    Leukocytes, UA Moderate (2+) (A) Negative   Appearance     Odor    Urine Culture     Status: Abnormal   Collection Time: 06/20/19  1:53 PM   Specimen: Urine  Result Value Ref Range   MICRO NUMBER: [id number]    SPECIMEN QUALITY: Adequate    Sample Source NOT GIVEN    STATUS: FINAL    ISOLATE 1: Escherichia coli (A)     Comment: 50,000-100,000 CFU/mL of Escherichia coli      Susceptibility   Escherichia coli - URINE CULTURE, REFLEX    AMOX/CLAVULANIC <=2 Sensitive     AMPICILLIN 4 Sensitive     AMPICILLIN/SULBACTAM <=2 Sensitive     CEFAZOLIN* <=4 Not Reportable      * For infections other than uncomplicated UTIcaused by E. coli, K. pneumoniae or P. mirabilis:Cefazolin is resistant if MIC > or = 8 mcg/mL.(Distinguishing susceptible versus intermediatefor  isolates with MIC < or = 4 mcg/mL requiresadditional testing.)For uncomplicated UTI caused by E. coli,K. pneumoniae or P. mirabilis: Cefazolin issusceptible if MIC <32 mcg/mL and predictssusceptible to the oral agents cefaclor, cefdinir,cefpodoxime, cefprozil, cefuroxime, cephalexinand loracarbef.    CEFEPIME <=1 Sensitive     CEFTRIAXONE <=1 Sensitive     CIPROFLOXACIN <=0.25 Sensitive     LEVOFLOXACIN <=0.12 Sensitive     ERTAPENEM <=0.5 Sensitive     GENTAMICIN <=1 Sensitive     IMIPENEM <=0.25 Sensitive     NITROFURANTOIN <=16 Sensitive     PIP/TAZO <=4 Sensitive     TOBRAMYCIN <=1 Sensitive     TRIMETH/SULFA* <=20 Sensitive      * For infections other than uncomplicated UTIcaused by E. coli, K.  pneumoniae or P. mirabilis:Cefazolin is resistant if MIC > or = 8 mcg/mL.(Distinguishing susceptible versus intermediatefor isolates with MIC < or = 4 mcg/mL requiresadditional testing.)For uncomplicated UTI caused by E. coli,K. pneumoniae or P. mirabilis: Cefazolin issusceptible if MIC <32 mcg/mL and predictssusceptible to the oral agents cefaclor, cefdinir,cefpodoxime, cefprozil, cefuroxime, cephalexinand loracarbef.Legend:S = Susceptible  I = IntermediateR = Resistant  NS = Not susceptible* = Not tested  NR = Not reported**NN = See antimicrobic comments    Assessment/Plan: 1. Dysuria Urine dip with trace blood. Classic UTI symptoms and + history. Will send for culture. Start Rx Macrobid BID x 5 days.Supportive measures and OTC medications reviewed. - POCT Urinalysis Dipstick - Urine Culture  2. Seasonal allergic rhinitis, unspecified trigger Start OTC Claritin or Xyzal. OTC Opcon-A. Increase fluids. Will monitor.    This visit occurred during the SARS-CoV-2 public health emergency.  Safety protocols were in place, including screening questions prior to the visit, additional usage of staff PPE, and extensive cleaning of exam room while observing appropriate contact time as indicated for  disinfecting solutions.     Piedad Climes, PA-C

## 2019-08-05 NOTE — Patient Instructions (Signed)
Start a daily Xyzal and OTC allergy eye drops.  Keep well-hydrated. Let me know if things are not significantly improving.   For urinary symptoms -- Please take your antibiotic (Macrobid) as directed until all pills are gone.  Stay very well hydrated.  Consider a daily probiotic (Align, Culturelle, or Activia) to help prevent stomach upset caused by the antibiotic.  Taking a probiotic daily may also help prevent recurrent UTIs.  Also consider taking AZO (Phenazopyridine) tablets to help decrease pain with urination.  I will call you with your urine testing results.  We will change antibiotics if indicated.  Call or return to clinic if symptoms are not resolved by completion of antibiotic.   Urinary Tract Infection A urinary tract infection (UTI) can occur any place along the urinary tract. The tract includes the kidneys, ureters, bladder, and urethra. A type of germ called bacteria often causes a UTI. UTIs are often helped with antibiotic medicine.  HOME CARE   If given, take antibiotics as told by your doctor. Finish them even if you start to feel better.  Drink enough fluids to keep your pee (urine) clear or pale yellow.  Avoid tea, drinks with caffeine, and bubbly (carbonated) drinks.  Pee often. Avoid holding your pee in for a long time.  Pee before and after having sex (intercourse).  Wipe from front to back after you poop (bowel movement) if you are a woman. Use each tissue only once. GET HELP RIGHT AWAY IF:   You have back pain.  You have lower belly (abdominal) pain.  You have chills.  You feel sick to your stomach (nauseous).  You throw up (vomit).  Your burning or discomfort with peeing does not go away.  You have a fever.  Your symptoms are not better in 3 days. MAKE SURE YOU:   Understand these instructions.  Will watch your condition.  Will get help right away if you are not doing well or get worse. Document Released: 10/29/2007 Document Revised: 02/04/2012  Document Reviewed: 12/11/2011 [facility] Patient Information 2015 [place], [place]. This information is not intended to replace advice given to you by your health care provider. Make sure you discuss any questions you have with your health care provider.

## 2019-08-07 LAB — URINE CULTURE
MICRO NUMBER:: 10246492
SPECIMEN QUALITY:: ADEQUATE

## 2019-08-09 ENCOUNTER — Other Ambulatory Visit: Payer: Self-pay | Admitting: Physician Assistant

## 2019-08-18 ENCOUNTER — Other Ambulatory Visit: Payer: Self-pay

## 2019-08-18 ENCOUNTER — Telehealth: Payer: Self-pay | Admitting: Physician Assistant

## 2019-08-18 ENCOUNTER — Other Ambulatory Visit: Payer: Self-pay | Admitting: Physician Assistant

## 2019-08-18 MED ORDER — MODAFINIL 200 MG PO TABS: 200.0000 mg | ORAL_TABLET | Freq: Every day | ORAL | 0 refills | Status: DC

## 2019-08-18 NOTE — Telephone Encounter (Signed)
Last apt 02/19 due back 4-6 weeks, nothing scheduled

## 2019-08-18 NOTE — Telephone Encounter (Signed)
Patient scheduled for 09/02/2019 Rx for Modafinil submitted to [facility].

## 2019-08-18 NOTE — Telephone Encounter (Signed)
Pt said that the pharmacy texted her and told her that her rx for Modafinil was denied. Pt said that she only has 1 left and she has been on this medication for months. Please send to [facility] on [address].

## 2019-09-02 ENCOUNTER — Ambulatory Visit (INDEPENDENT_AMBULATORY_CARE_PROVIDER_SITE_OTHER): Payer: 59 | Admitting: Physician Assistant

## 2019-09-02 ENCOUNTER — Other Ambulatory Visit: Payer: Self-pay

## 2019-09-02 ENCOUNTER — Telehealth: Payer: Self-pay | Admitting: Emergency Medicine

## 2019-09-02 ENCOUNTER — Encounter: Payer: Self-pay | Admitting: Physician Assistant

## 2019-09-02 DIAGNOSIS — F411 Generalized anxiety disorder: Secondary | ICD-10-CM

## 2019-09-02 DIAGNOSIS — F4310 Post-traumatic stress disorder, unspecified: Secondary | ICD-10-CM

## 2019-09-02 DIAGNOSIS — F900 Attention-deficit hyperactivity disorder, predominantly inattentive type: Secondary | ICD-10-CM

## 2019-09-02 DIAGNOSIS — F331 Major depressive disorder, recurrent, moderate: Secondary | ICD-10-CM | POA: Diagnosis not present

## 2019-09-02 MED ORDER — GABAPENTIN 600 MG PO TABS: 600.0000 mg | ORAL_TABLET | Freq: Two times a day (BID) | ORAL | 1 refills | Status: DC

## 2019-09-02 MED ORDER — MODAFINIL 200 MG PO TABS: 200.0000 mg | ORAL_TABLET | Freq: Every day | ORAL | 0 refills | Status: DC

## 2019-09-02 MED ORDER — BREXPIPRAZOLE 1 MG PO TABS: 1.0000 mg | ORAL_TABLET | Freq: Every morning | ORAL | 0 refills | Status: DC

## 2019-09-02 MED ORDER — CLONAZEPAM 1 MG PO TABS: ORAL_TABLET | ORAL | 1 refills | Status: DC

## 2019-09-02 NOTE — Progress Notes (Signed)
[facility] Med Check  Patient ID: Valerie Evans,  MRN: [id number]  PCP: Waldon Merl, PA-C  Date of Evaluation: 09/02/2019 Time spent:20 minutes  Chief Complaint:  Chief Complaint    Anxiety; Depression; Insomnia     HISTORY/CURRENT STATUS: For routine med check.  At LOV we increased the Abilify.  She is still having quite a bit of depression.  Not wanting to do anything.  Does not enjoy anything.  Not a lot of energy or motivation.  The modafinil helps but not as much as she would like.  She does not feel rested when she wakes up but she sleeps okay.  No suicidal or homicidal thoughts.  She is still anxious a lot.  She is concerned about what she will do when the unemployment runs out.  She is applying for jobs but has not had any luck yet.  With the lupus, states it is hard to find a job that is not hard on her physically.  She still needs the Klonopin to help with anxiety.  It is more generalized, not panic attacks.  Patient denies increased energy with decreased need for sleep, no increased talkativeness, no racing thoughts, no impulsivity or risky behaviors, no increased spending, no increased libido, no grandiosity, no increased irritability or anger, and no hallucinations.  Denies dizziness, syncope, seizures, numbness, tingling, tremor, tics, unsteady gait, slurred speech, confusion. Denies muscle or joint pain, stiffness, or dystonia.  Individual Medical History/ Review of Systems: Changes? :Yes She had a UTI since her last visit.  Past medications for mental health diagnoses include: Cymbalta, Zoloft, Wellbutrin, Xanax, trazodone, Lexapro, Elavil, Sonata Klonopin, Gabapentin, Abilify.   Allergies: Patient has no known allergies.  Current Medications:  Current Outpatient Medications:  .  albuterol (VENTOLIN HFA) 108 (90 Base) MCG/ACT inhaler, Inhale 1-2 puffs into the lungs every 6 (six) hours as needed for wheezing or shortness of breath., Disp: , Rfl:  .   ARIPiprazole (ABILIFY) 10 MG tablet, TAKE 1 TABLET BY MOUTH EVERY DAY, Disp: 90 tablet, Rfl: 0 .  clonazePAM (KLONOPIN) 1 MG tablet, 1 po tid prn and may take 1/2 po mid-day on occas prn., Disp: 105 tablet, Rfl: 1 .  FLUoxetine (PROZAC) 40 MG capsule, TAKE 2 CAPSULES BY MOUTH DAILY (Patient taking differently: Take 40 mg by mouth 2 (two) times daily. ), Disp: 180 capsule, Rfl: 1 .  gabapentin (NEURONTIN) 600 MG tablet, Take 1 tablet (600 mg total) by mouth 2 (two) times daily., Disp: 60 tablet, Rfl: 1 .  hydroxychloroquine (PLAQUENIL) 200 MG tablet, Take 100 mg by mouth daily., Disp: , Rfl:  .  modafinil (PROVIGIL) 200 MG tablet, Take 1 tablet (200 mg total) by mouth daily., Disp: 30 tablet, Rfl: 0 .  Multiple Vitamin (MULTIVITAMIN WITH MINERALS) TABS tablet, Take 1 tablet by mouth daily., Disp: , Rfl:  .  ondansetron (ZOFRAN ODT) 4 MG disintegrating tablet, Take 1 tablet (4 mg total) by mouth every 8 (eight) hours as needed for nausea., Disp: 10 tablet, Rfl: 0 .  promethazine (PHENERGAN) 25 MG tablet, Take 1 tablet (25 mg total) by mouth every 6 (six) hours as needed for nausea or vomiting., Disp: 30 tablet, Rfl: 0 .  traZODone (DESYREL) 100 MG tablet, Take 1 tablet (100 mg total) by mouth at bedtime as needed for sleep., Disp: 180 tablet, Rfl: 1 .  brexpiprazole (REXULTI) 1 MG TABS tablet, Take 1 tablet (1 mg total) by mouth in the morning., Disp: 30 tablet, Rfl: 0 .  levothyroxine (  SYNTHROID) 25 MCG tablet, Take 1 tablet (25 mcg total) by mouth daily before breakfast., Disp: 90 tablet, Rfl: 0 .  nitrofurantoin, macrocrystal-monohydrate, (MACROBID) 100 MG capsule, Take 1 capsule (100 mg total) by mouth 2 (two) times daily. 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   MENTAL HEALTH EXAM:  There were no vitals taken for this  visit.There is no height or weight on file to calculate BMI.  General Appearance: Casual, Neat and Well Groomed  Eye Contact:  Good  Speech:  Clear and Coherent  Volume:  Normal  Mood:  Depressed  Affect:  Appropriate  Thought Process:  Goal Directed and Descriptions of Associations: Intact  Orientation:  Full (Time, Place, and Person)  Thought Content: Logical   Suicidal Thoughts:  No  Homicidal Thoughts:  No  Memory:  WNL  Judgement:  Good  Insight:  Good  Psychomotor Activity:  Normal  Concentration:  Concentration: Good and Attention Span: Good  Recall:  Good  Fund of Knowledge: Good  Language: Good  Assets:  Desire for Improvement  ADL's:  Intact  Cognition: WNL  Prognosis:  Good    DIAGNOSES:    ICD-10-CM   1. Major depressive disorder, recurrent episode, moderate (HCC)  F33.1   2. PTSD (post-traumatic stress disorder)  F43.10   3. Generalized anxiety disorder  F41.1   4. Attention deficit hyperactivity disorder (ADHD), predominantly inattentive type  F90.0     Receiving Psychotherapy: No    RECOMMENDATIONS:  I spent 20 minutes with her. PDMP was reviewed. D/C Abilify. Start Rexulti 1 mg q am.  Continue Klonopin 1 mg 3 times daily as needed and one half midday as needed. Continue Prozac 40 mg, 2 p.o. daily. Continue modafinil 200 mg 1 p.o. every morning Give 90 day supply on all meds months her dose is nailed down. Return in 4 to 6 weeks.  Donnal Moat, PA-C

## 2019-09-02 NOTE — Telephone Encounter (Signed)
[facility] [place] [facility] TELEPHONE ADVICE RECORD Access[employer]urse Patient Name: Valerie Evans Gender: Female DOB: Apr 07, 1986 Age: 34 Y 7 M 1 D Return Phone Number: [phone] (Primary) Address: City/State/Zip: [place] [address] Client [facility] [place] [facility] - [place] Client Site [facility] - Day Physician Malva Cogan - [place] Contact Type Call Who Is Calling Patient / Member / Family / Caregiver Call Type Triage / Clinical Relationship To Patient Self Return Phone Number [phone] (Primary) Chief Complaint CHEST PAIN (>=21 years) - pain, pressure, heaviness or tightness Reason for Call Symptomatic / Request for Health Information Initial Comment Caller states she has been experiencing chest pains. GOTO Facility Not Listed [facility] Translation No Nurse Assessment Nurse: Doylene Canard, RN, Rinaldo Cloud Date/Time (Eastern Time): 09/02/2019 10:39:55 AM Confirm and document reason for call. If symptomatic, describe symptoms. ---Caller states she has chest and right side pain with deep breathing, also wheezing. Has the patient had close contact with a person known or suspected to have the novel coronavirus illness OR traveled / lives in area with major community spread (including international travel) in the last 14 days from the onset of symptoms? * If Asymptomatic, screen for exposure and travel within the last 14 days. ---No Does the patient have any new or worsening symptoms? ---Yes Will a triage be completed? ---Yes Related visit to physician within the last 2 weeks? ---No Does the PT have any chronic conditions? (i.e. diabetes, asthma, this includes High risk factors for pregnancy, etc.) ---Yes List chronic conditions. ---Lupus Is the patient pregnant or possibly pregnant? (Ask all females between the ages of 75-55) ---No Is this a behavioral health or substance abuse call? ---No Guidelines Guideline  Title Affirmed Question Affirmed Notes Nurse Date/Time Lamount Cohen Time) Chest Pain SEVERE chest pain Doylene Canard, RN, Rinaldo Cloud 09/02/2019 10:41:46 AMPLEASE NOTE: All timestamps contained within this report are represented as [place] Standard Time. CONFIDENTIALTY NOTICE: This fax transmission is intended only for the addressee. It contains information that is legally privileged, confidential or otherwise protected from use or disclosure. If you are not the intended recipient, you are strictly prohibited from reviewing, disclosing, copying using or disseminating any of this information or taking any action in reliance on or regarding this information. If you have received this fax in error, please notify us immediately by telephone so that we can arrange for its return to [place]. Phone: [phone], Toll-Free: [phone], Fax: [phone] Page: 2 of 2 Call Id: [id number] Disp. Time Lamount Cohen Time) Disposition Final User 09/02/2019 10:37:25 AM Send to Urgent Queue [place] Parents 09/02/2019 10:43:22 AM Go to ED Now Yes Doylene Canard, RN, Joaquin Courts Disagree/Comply Comply Caller Understands Yes PreDisposition Did not know what to do Care Advice Given Per Guideline GO TO ED NOW: * Leave now. Drive carefully. NOTE TO TRIAGER - DRIVING: * Another adult should drive. BRING MEDICINES: * Please bring a list of your current medicines when you go to the Emergency Department (ER). NOTHING BY MOUTH: * Do not eat or drink anything for now. CALL EMS IF: * Severe difficulty breathing occurs * Passes out or becomes too weak to stand * You become worse. CARE ADVICE given per Chest Pain (Adult) guideline. Referrals GO TO FACILITY OTHER - SPECIF

## 2019-09-07 ENCOUNTER — Other Ambulatory Visit: Payer: Self-pay | Admitting: Emergency Medicine

## 2019-09-07 ENCOUNTER — Telehealth: Payer: Self-pay

## 2019-09-07 ENCOUNTER — Ambulatory Visit (INDEPENDENT_AMBULATORY_CARE_PROVIDER_SITE_OTHER): Payer: 59 | Admitting: Physician Assistant

## 2019-09-07 ENCOUNTER — Encounter: Payer: Self-pay | Admitting: Physician Assistant

## 2019-09-07 ENCOUNTER — Other Ambulatory Visit: Payer: Self-pay

## 2019-09-07 VITALS — BP 90/60 | HR 70 | Temp 98.8°F | Resp 14 | Ht 60.0 in | Wt 98.0 lb

## 2019-09-07 DIAGNOSIS — R109 Unspecified abdominal pain: Secondary | ICD-10-CM

## 2019-09-07 LAB — CBC WITH DIFFERENTIAL/PLATELET
Basophils Absolute: 0.1 10*3/uL (ref 0.0–0.1)
Basophils Relative: 1.5 % (ref 0.0–3.0)
Eosinophils Absolute: 1.4 10*3/uL — ABNORMAL HIGH (ref 0.0–0.7)
Eosinophils Relative: 18.5 % — ABNORMAL HIGH (ref 0.0–5.0)
HCT: 40.2 % (ref 36.0–46.0)
Hemoglobin: 13.3 g/dL (ref 12.0–15.0)
Lymphocytes Relative: 19.6 % (ref 12.0–46.0)
Lymphs Abs: 1.4 10*3/uL (ref 0.7–4.0)
MCHC: 33.2 g/dL (ref 30.0–36.0)
MCV: 92.2 fl (ref 78.0–100.0)
Monocytes Absolute: 0.4 10*3/uL (ref 0.1–1.0)
Monocytes Relative: 5.6 % (ref 3.0–12.0)
Neutro Abs: 4 10*3/uL (ref 1.4–7.7)
Neutrophils Relative %: 54.8 % (ref 43.0–77.0)
Platelets: 290 10*3/uL (ref 150.0–400.0)
RBC: 4.36 Mil/uL (ref 3.87–5.11)
RDW: 13.2 % (ref 11.5–15.5)
WBC: 7.4 10*3/uL (ref 4.0–10.5)

## 2019-09-07 LAB — POCT URINALYSIS DIPSTICK
Bilirubin, UA: NEGATIVE
Glucose, UA: NEGATIVE
Ketones, UA: NEGATIVE
Leukocytes, UA: NEGATIVE
Nitrite, UA: NEGATIVE
Protein, UA: NEGATIVE
Spec Grav, UA: <=1.005 — AB (ref 1.010–1.025)
Urobilinogen, UA: 0.2 E.U./dL
pH, UA: 7 (ref 5.0–8.0)

## 2019-09-07 LAB — COMPREHENSIVE METABOLIC PANEL
ALT: 8 U/L (ref 0–35)
AST: 14 U/L (ref 0–37)
Albumin: 4.4 g/dL (ref 3.5–5.2)
Alkaline Phosphatase: 156 U/L — ABNORMAL HIGH (ref 39–117)
BUN: 6 mg/dL (ref 6–23)
CO2: 29 mEq/L (ref 19–32)
Calcium: 9.8 mg/dL (ref 8.4–10.5)
Chloride: 104 mEq/L (ref 96–112)
Creatinine, Ser: 0.69 mg/dL (ref 0.40–1.20)
GFR: 98.23 mL/min (ref >60.00)
Glucose, Bld: 67 mg/dL — ABNORMAL LOW (ref 70–99)
Potassium: 4.3 mEq/L (ref 3.5–5.1)
Sodium: 140 mEq/L (ref 135–145)
Total Bilirubin: 0.3 mg/dL (ref 0.2–1.2)
Total Protein: 7 g/dL (ref 6.0–8.3)

## 2019-09-07 MED ORDER — KETOROLAC TROMETHAMINE 60 MG/2ML IM SOLN: 60.0000 mg | Freq: Once | INTRAMUSCULAR | Status: AC

## 2019-09-07 MED ORDER — MELOXICAM 15 MG PO TABS: 15.0000 mg | ORAL_TABLET | Freq: Every day | ORAL | 0 refills | Status: DC

## 2019-09-07 MED ORDER — CYCLOBENZAPRINE HCL 10 MG PO TABS: 10.0000 mg | ORAL_TABLET | Freq: Three times a day (TID) | ORAL | 0 refills | Status: DC | PRN

## 2019-09-07 MED ORDER — KETOROLAC TROMETHAMINE 60 MG/2ML IM SOLN
60.0000 mg | Freq: Once | INTRAMUSCULAR | Status: DC
  Administered 2019-09-07: 60 mg via INTRAMUSCULAR

## 2019-09-07 NOTE — Addendum Note (Signed)
Addended by: Waldon Merl on: 09/07/2019 12:49 PM   Modules accepted: Orders

## 2019-09-07 NOTE — Patient Instructions (Signed)
Please rest.  Start the muscle relaxant when you get home. Do not drive when taking this medication. Start the Meloxicam tomorrow.  The Toradol given today should help calm things down.  Tylenol today if needed for breakthrough pain.  Again with flank pain we do worry about infection, stones and muscular causes. Your urine testing was negative for infection today. We are checking labs to make sure no concern of infection or abdominal cause of symptoms. Giving your pain with movement and tenderness to palpation of muscles here we are going to start treating a musculoskeletal cause.  If anything is worsening or you note nausea/vomiting, fever, chills you need to be seen in the ER for imaging.   We will call tomorrow to check on you!

## 2019-09-07 NOTE — Telephone Encounter (Signed)
Patient was seen today and two prescriptions was suppose to be sent in. Pt  states the pharmacy haven't received the muscle relaxer medication

## 2019-09-07 NOTE — Progress Notes (Signed)
Patient presents to clinic today c/o R side and flank pain starting early this AM. Notes she had woken up around midnight with a coughing spell out of the blue. Felt like she may have pulled something as she had some discomfort in the area of concern afterwards. Fell back asleep and woke up later in the morning after moving, noting significant discomfort in the area. Notes this has continued since then -- is constant, worse with movement of position changes. Milder when still. Denies any dysuria, urinary urgency, frequency, hematuria. Denies nausea, vomiting. Denies abdominal pain or change in stool habits. LMP finished 2 days ago. No history of renal stone.   Past Medical History:  Diagnosis Date  . Anxiety   . Asthma   . Chronic headache   . Depression   . History of chickenpox   . Lupus ([place])   . Thyroid disease   . UTI (lower urinary tract infection)     Current Outpatient Medications on File Prior to Visit  Medication Sig Dispense Refill  . albuterol (VENTOLIN HFA) 108 (90 Base) MCG/ACT inhaler Inhale 1-2 puffs into the lungs every 6 (six) hours as needed for wheezing or shortness of breath.    . ARIPiprazole (ABILIFY) 10 MG tablet TAKE 1 TABLET BY MOUTH EVERY DAY 90 tablet 0  . brexpiprazole (REXULTI) 1 MG TABS tablet Take 1 tablet (1 mg total) by mouth in the morning. 30 tablet 0  . clonazePAM (KLONOPIN) 1 MG tablet 1 po tid prn and may take 1/2 po mid-day on occas prn. 105 tablet 1  . FLUoxetine (PROZAC) 40 MG capsule TAKE 2 CAPSULES BY MOUTH DAILY (Patient taking differently: Take 40 mg by mouth 2 (two) times daily. ) 180 capsule 1  . gabapentin (NEURONTIN) 600 MG tablet Take 1 tablet (600 mg total) by mouth 2 (two) times daily. 60 tablet 1  . hydroxychloroquine (PLAQUENIL) 200 MG tablet Take 100 mg by mouth daily.    Marland Kitchen levothyroxine (SYNTHROID) 25 MCG tablet Take 1 tablet (25 mcg total) by mouth daily before breakfast. 90 tablet 0  . modafinil (PROVIGIL) 200 MG tablet Take 1  tablet (200 mg total) by mouth daily. 30 tablet 0  . Multiple Vitamin (MULTIVITAMIN WITH MINERALS) TABS tablet Take 1 tablet by mouth daily.    . ondansetron (ZOFRAN ODT) 4 MG disintegrating tablet Take 1 tablet (4 mg total) by mouth every 8 (eight) hours as needed for nausea. 10 tablet 0  . promethazine (PHENERGAN) 25 MG tablet Take 1 tablet (25 mg total) by mouth every 6 (six) hours as needed for nausea or vomiting. 30 tablet 0  . traZODone (DESYREL) 100 MG tablet Take 1 tablet (100 mg total) by mouth at bedtime as needed for sleep. 180 tablet 1   No current facility-administered medications on file prior to visit.    No Known Allergies  Family History  Problem Relation Age of Onset  . Heart disease Father   . Depression Father   . Heart attack Father   . Hyperlipidemia Father   . Hypertension Father   . Hypertension Mother   . Depression Mother   . Cancer Maternal Aunt        Breast Cancer  . Healthy Sister   . Cancer Paternal Grandfather        Skin    Social History   Socioeconomic History  . Marital status: Married    Spouse name: Not on file  . Number of children: Not on file  .  Years of education: Not on file  . Highest education level: Not on file  Occupational History  . Not on file  Tobacco Use  . Smoking status: Never Smoker  . Smokeless tobacco: Never Used  Substance and Sexual Activity  . Alcohol use: No    Comment: rarely  . Drug use: No  . Sexual activity: Yes  Other Topics Concern  . Not on file  Social History Narrative  . Not on file   Social Determinants of Health   Financial Resource Strain:   . Difficulty of Paying Living Expenses:   Food Insecurity:   . Worried About Charity fundraiser in the Last Year:   . Arboriculturist in the Last Year:   Transportation Needs:   . Film/video editor (Medical):   Marland Kitchen Lack of Transportation (Non-Medical):   Physical Activity:   . Days of Exercise per Week:   . Minutes of Exercise per Session:     Stress:   . Feeling of Stress :   Social Connections:   . Frequency of Communication with Friends and Family:   . Frequency of Social Gatherings with Friends and Family:   . Attends Religious Services:   . Active Member of Clubs or Organizations:   . Attends Archivist Meetings:   Marland Kitchen Marital Status:    Review of Systems - See HPI.  All other ROS are negative.  BP 90/60   Pulse 70   Temp 98.8 F (37.1 C) (Temporal)   Resp 14   Ht 5' (1.524 m)   Wt 98 lb (44.5 kg)   SpO2 99%   BMI 19.14 kg/m   Physical Exam Vitals reviewed.  Constitutional:      Appearance: Normal appearance.  HENT:     Head: Normocephalic and atraumatic.  Cardiovascular:     Rate and Rhythm: Normal rate and regular rhythm.     Heart sounds: Normal heart sounds.  Pulmonary:     Effort: Pulmonary effort is normal.     Breath sounds: Normal breath sounds.  Abdominal:     General: Bowel sounds are normal. There is no distension.     Palpations: Abdomen is soft. There is no mass.     Tenderness: There is abdominal tenderness (lateral RUQ). There is no right CVA tenderness, left CVA tenderness, guarding or rebound.     Hernia: No hernia is present.  Musculoskeletal:     Cervical back: Neck supple.     Thoracic back: Tenderness present. No swelling, deformity or spasms.     Comments: R thoracic perispinal muscular tenderness, worse lateral. Spasm noted. No bony tenderness to palpation or percussion  Neurological:     General: No focal deficit present.     Mental Status: She is alert and oriented to person, place, and time.    Recent Results (from the past 2160 hour(s))  POCT Urinalysis Dipstick     Status: Abnormal   Collection Time: 06/20/19  1:23 PM  Result Value Ref Range   Color, UA yellow    Clarity, UA cloudy    Glucose, UA Negative Negative   Bilirubin, UA Negative    Ketones, UA Negative    Spec Grav, UA 1.010 1.010 - 1.025   Blood, UA 2+    pH, UA 6.5 5.0 - 8.0   Protein, UA  Negative Negative   Urobilinogen, UA 0.2 0.2 or 1.0 E.U./dL   Nitrite, UA Negative    Leukocytes, UA Moderate (2+) (A)  Negative   Appearance     Odor    Urine Culture     Status: Abnormal   Collection Time: 06/20/19  1:53 PM   Specimen: Urine  Result Value Ref Range   MICRO NUMBER: [id number]    SPECIMEN QUALITY: Adequate    Sample Source NOT GIVEN    STATUS: FINAL    ISOLATE 1: Escherichia coli (A)     Comment: 50,000-100,000 CFU/mL of Escherichia coli      Susceptibility   Escherichia coli - URINE CULTURE, REFLEX    AMOX/CLAVULANIC <=2 Sensitive     AMPICILLIN 4 Sensitive     AMPICILLIN/SULBACTAM <=2 Sensitive     CEFAZOLIN* <=4 Not Reportable      * For infections other than uncomplicated UTIcaused by E. coli, K. pneumoniae or P. mirabilis:Cefazolin is resistant if MIC > or = 8 mcg/mL.(Distinguishing susceptible versus intermediatefor isolates with MIC < or = 4 mcg/mL requiresadditional testing.)For uncomplicated UTI caused by E. coli,K. pneumoniae or P. mirabilis: Cefazolin issusceptible if MIC <32 mcg/mL and predictssusceptible to the oral agents cefaclor, cefdinir,cefpodoxime, cefprozil, cefuroxime, cephalexinand loracarbef.    CEFEPIME <=1 Sensitive     CEFTRIAXONE <=1 Sensitive     CIPROFLOXACIN <=0.25 Sensitive     LEVOFLOXACIN <=0.12 Sensitive     ERTAPENEM <=0.5 Sensitive     GENTAMICIN <=1 Sensitive     IMIPENEM <=0.25 Sensitive     NITROFURANTOIN <=16 Sensitive     PIP/TAZO <=4 Sensitive     TOBRAMYCIN <=1 Sensitive     TRIMETH/SULFA* <=20 Sensitive      * For infections other than uncomplicated UTIcaused by E. coli, K. pneumoniae or P. mirabilis:Cefazolin is resistant if MIC > or = 8 mcg/mL.(Distinguishing susceptible versus intermediatefor isolates with MIC < or = 4 mcg/mL requiresadditional testing.)For uncomplicated UTI caused by E. coli,K. pneumoniae or P. mirabilis: Cefazolin issusceptible if MIC <32 mcg/mL and predictssusceptible to the oral agents cefaclor,  cefdinir,cefpodoxime, cefprozil, cefuroxime, cephalexinand loracarbef.Legend:S = Susceptible  I = IntermediateR = Resistant  NS = Not susceptible* = Not tested  NR = Not reported**NN = See antimicrobic comments  POCT Urinalysis Dipstick     Status: Abnormal   Collection Time: 08/05/19  1:42 PM  Result Value Ref Range   Color, UA yellow    Clarity, UA clear    Glucose, UA Negative Negative   Bilirubin, UA negative    Ketones, UA negative    Spec Grav, UA 1.010 1.010 - 1.025   Blood, UA trace     Comment: just finishing menstrual cycle   pH, UA 6.0 5.0 - 8.0   Protein, UA Negative Negative   Urobilinogen, UA 0.2 0.2 or 1.0 E.U./dL   Nitrite, UA negative    Leukocytes, UA Negative Negative   Appearance     Odor    Urine Culture     Status: Abnormal   Collection Time: 08/05/19  2:07 PM   Specimen: Urine  Result Value Ref Range   MICRO NUMBER: [id number]    SPECIMEN QUALITY: Adequate    Sample Source URINE    STATUS: FINAL    ISOLATE 1: Escherichia coli (A)     Comment: 10,000-49,000 CFU/mL of Escherichia coli      Susceptibility   Escherichia coli - URINE CULTURE, REFLEX    AMOX/CLAVULANIC 4 Sensitive     AMPICILLIN 8 Sensitive     AMPICILLIN/SULBACTAM 4 Sensitive     CEFAZOLIN* <=4 Not Reportable      * For infections other  than uncomplicated UTIcaused by E. coli, K. pneumoniae or P. mirabilis:Cefazolin is resistant if MIC > or = 8 mcg/mL.(Distinguishing susceptible versus intermediatefor isolates with MIC < or = 4 mcg/mL requiresadditional testing.)For uncomplicated UTI caused by E. coli,K. pneumoniae or P. mirabilis: Cefazolin issusceptible if MIC <32 mcg/mL and predictssusceptible to the oral agents cefaclor, cefdinir,cefpodoxime, cefprozil, cefuroxime, cephalexinand loracarbef.    CEFEPIME <=1 Sensitive     CEFTRIAXONE <=1 Sensitive     CIPROFLOXACIN <=0.25 Sensitive     LEVOFLOXACIN <=0.12 Sensitive     ERTAPENEM <=0.5 Sensitive     GENTAMICIN <=1 Sensitive     IMIPENEM  <=0.25 Sensitive     NITROFURANTOIN <=16 Sensitive     PIP/TAZO <=4 Sensitive     TOBRAMYCIN <=1 Sensitive     TRIMETH/SULFA* <=20 Sensitive      * For infections other than uncomplicated UTIcaused by E. coli, K. pneumoniae or P. mirabilis:Cefazolin is resistant if MIC > or = 8 mcg/mL.(Distinguishing susceptible versus intermediatefor isolates with MIC < or = 4 mcg/mL requiresadditional testing.)For uncomplicated UTI caused by E. coli,K. pneumoniae or P. mirabilis: Cefazolin issusceptible if MIC <32 mcg/mL and predictssusceptible to the oral agents cefaclor, cefdinir,cefpodoxime, cefprozil, cefuroxime, cephalexinand loracarbef.Legend:S = Susceptible  I = IntermediateR = Resistant  NS = Not susceptible* = Not tested  NR = Not reported**NN = See antimicrobic comments   Assessment/Plan: 1. Right flank pain Urine dip negative for infection. Trace blood on dip -- coming off of period. Symptoms exacerbated by movement, + tenderness of musculature. Most likely MSK giving this and history but will check CBC, CMP to further assess infection, and assess gallbladder/liver. Discussed stone is a possibility but not associated with muscular tenderness and spasm. IM Toradol given in office today. Start Meloxicam tomorrow. Flexeril per orders. Will follow closely. Anything acutely worsening recommend ER assessment so CT can be obtained to make sure no concern for stone, etc. Will call patient tomorrow to check on her.  - POCT urinalysis dipstick - CBC w/Diff - Comp Met (CMET) - ketorolac (TORADOL) injection 60 mg    Leeanne Rio, [place]

## 2019-09-07 NOTE — Telephone Encounter (Signed)
I have just resubmitted Rx. My apologies. Thank you

## 2019-09-08 ENCOUNTER — Other Ambulatory Visit: Payer: Self-pay

## 2019-09-08 ENCOUNTER — Telehealth: Payer: Self-pay | Admitting: Physician Assistant

## 2019-09-08 ENCOUNTER — Other Ambulatory Visit: Payer: Self-pay | Admitting: General Practice

## 2019-09-08 ENCOUNTER — Telehealth: Payer: Self-pay

## 2019-09-08 ENCOUNTER — Ambulatory Visit (HOSPITAL_BASED_OUTPATIENT_CLINIC_OR_DEPARTMENT_OTHER)
Admission: RE | Admit: 2019-09-08 | Discharge: 2019-09-08 | Disposition: A | Discharge status: Home / Self Care | Payer: 59 | Source: Ambulatory Visit | Attending: Physician Assistant | Admitting: Physician Assistant

## 2019-09-08 ENCOUNTER — Other Ambulatory Visit: Payer: Self-pay | Admitting: Physician Assistant

## 2019-09-08 DIAGNOSIS — R109 Unspecified abdominal pain: Secondary | ICD-10-CM | POA: Diagnosis not present

## 2019-09-08 DIAGNOSIS — R319 Hematuria, unspecified: Secondary | ICD-10-CM

## 2019-09-08 MED ORDER — HYDROCODONE-ACETAMINOPHEN 10-325 MG PO TABS: 1.0000 | ORAL_TABLET | Freq: Three times a day (TID) | ORAL | 0 refills | Status: AC | PRN

## 2019-09-08 NOTE — Telephone Encounter (Signed)
This order was placed

## 2019-09-08 NOTE — Telephone Encounter (Signed)
Yes I reviewed her results this morning. Awaiting CT results

## 2019-09-08 NOTE — Telephone Encounter (Signed)
Clyda Hurdle with [facility] called with results on the patient. She states patient had a normal right upper quadrant ultrasound.

## 2019-09-08 NOTE — Addendum Note (Signed)
Addended by: Waldon Merl on: 09/08/2019 07:41 AM   Modules accepted: Orders

## 2019-09-08 NOTE — Telephone Encounter (Signed)
Pt is ok with having a CT scan, she states that is in a lot of pain. She is open to what Selena Batten suggests she do.

## 2019-09-08 NOTE — Telephone Encounter (Signed)
I sent result note back to cma for order to be placed ct renal study. Jess in Patina's absence this afternoon are you able to assist?

## 2019-09-09 ENCOUNTER — Other Ambulatory Visit: Payer: Self-pay | Admitting: Emergency Medicine

## 2019-09-09 ENCOUNTER — Ambulatory Visit (HOSPITAL_BASED_OUTPATIENT_CLINIC_OR_DEPARTMENT_OTHER)
Admission: RE | Admit: 2019-09-09 | Discharge: 2019-09-09 | Disposition: A | Discharge status: Home / Self Care | Payer: 59 | Source: Ambulatory Visit | Attending: Physician Assistant | Admitting: Physician Assistant

## 2019-09-09 ENCOUNTER — Other Ambulatory Visit: Payer: Self-pay

## 2019-09-09 ENCOUNTER — Telehealth: Payer: Self-pay | Admitting: Physician Assistant

## 2019-09-09 ENCOUNTER — Encounter: Payer: Self-pay | Admitting: Physician Assistant

## 2019-09-09 ENCOUNTER — Other Ambulatory Visit: Payer: Self-pay | Admitting: Physician Assistant

## 2019-09-09 DIAGNOSIS — N12 Tubulo-interstitial nephritis, not specified as acute or chronic: Secondary | ICD-10-CM

## 2019-09-09 DIAGNOSIS — R109 Unspecified abdominal pain: Secondary | ICD-10-CM | POA: Insufficient documentation

## 2019-09-09 DIAGNOSIS — R319 Hematuria, unspecified: Secondary | ICD-10-CM | POA: Diagnosis present

## 2019-09-09 MED ORDER — SULFAMETHOXAZOLE-TRIMETHOPRIM 800-160 MG PO TABS: 1.0000 | ORAL_TABLET | Freq: Two times a day (BID) | ORAL | 0 refills | Status: AC

## 2019-09-09 MED ORDER — CIPROFLOXACIN HCL 500 MG PO TABS: 500.0000 mg | ORAL_TABLET | Freq: Two times a day (BID) | ORAL | 0 refills | Status: DC

## 2019-09-09 NOTE — Telephone Encounter (Signed)
FQ is standard of care of pyelonephritis. Cipro has much lower risk of this that Levaquin. Since pharmacist refusing to fill, switch to [facility] DS 1 tab BID x 7 days. Please contact patient as she will need to come in Monday for repeat urine sample for culture.

## 2019-09-09 NOTE — Telephone Encounter (Signed)
Kim from [facility] called stating Valerie Evans prescribed pt ciprofloxacin 500 MG. Pharmacist states pt is currently taking hydroxychloroquine prescribed by Dr. Dierdre Forth. Pharmacist states these two drugs may have an interaction by prolonging QT intervals and cause cardiac arrhythmias. Pharmacist wanted to know if Selena Batten wanted to send in a different antibiotic. Please advise.

## 2019-09-09 NOTE — Telephone Encounter (Signed)
Called [facility] Kim and advised that PCP is aware of interaction and antibiotic Cipro will be changed to Bactrim DS. Rx sent to the pharmacy.

## 2019-09-09 NOTE — Telephone Encounter (Signed)
Pt called stating she saw Cody 2 days ago. Pt states she was prescribed hydrocodone but she does not think it is working. Pt asked if Selena Batten could prescribe her something different. Please advise.

## 2019-09-13 ENCOUNTER — Telehealth: Payer: Self-pay | Admitting: Physician Assistant

## 2019-09-13 NOTE — Telephone Encounter (Signed)
Woohoooo!

## 2019-09-13 NOTE — Telephone Encounter (Signed)
Approval for REXULTI 1 MG TAB. Approved from 09/07/2019 to 09/06/2020.  [employer]

## 2019-09-20 ENCOUNTER — Ambulatory Visit (INDEPENDENT_AMBULATORY_CARE_PROVIDER_SITE_OTHER): Payer: 59 | Admitting: Physician Assistant

## 2019-09-20 ENCOUNTER — Other Ambulatory Visit: Payer: Self-pay

## 2019-09-20 ENCOUNTER — Encounter: Payer: Self-pay | Admitting: Physician Assistant

## 2019-09-20 VITALS — BP 100/64 | HR 86 | Temp 98.6°F | Resp 14 | Ht 60.0 in | Wt 95.0 lb

## 2019-09-20 DIAGNOSIS — R748 Abnormal levels of other serum enzymes: Secondary | ICD-10-CM

## 2019-09-20 DIAGNOSIS — R109 Unspecified abdominal pain: Secondary | ICD-10-CM

## 2019-09-20 LAB — POCT URINALYSIS DIPSTICK
Bilirubin, UA: NEGATIVE
Glucose, UA: NEGATIVE
Ketones, UA: NEGATIVE
Leukocytes, UA: NEGATIVE
Nitrite, UA: NEGATIVE
Protein, UA: NEGATIVE
Spec Grav, UA: <=1.005 — AB (ref 1.010–1.025)
Urobilinogen, UA: 0.2 E.U./dL
pH, UA: 6.5 (ref 5.0–8.0)

## 2019-09-20 MED ORDER — ETODOLAC 200 MG PO CAPS: 200.0000 mg | ORAL_CAPSULE | Freq: Three times a day (TID) | ORAL | 0 refills | Status: DC

## 2019-09-20 NOTE — Progress Notes (Signed)
Patient presents to clinic today c/o recurrence of right flank pain.  Patient was initially seen on 09/07/2019 with acute onset right flank pain without associated symptoms.  Initial examination revealed tenderness of right lower ribs and flank.  UA obtained at that time was unremarkable.  Felt symptoms were musculoskeletal but further work-up including CBC and CMP were obtained.  These were unremarkable except for mild elevation in alk phos.  Patient endorsed worsening right flank pain moving around to her right upper quadrant.  As such ultrasound was obtained giving this change in location of pain and lab findings.  This was negative for gallstone or cholelithiasis.  Patient was continued on anti-inflammatory and muscle relaxant but denied any improvement in symptoms.  Noted worsening flank pain so CT was obtained on 09/09/2019, revealing " punctate bilateral nonobstructing renal calculi; mild perinephric stranding felt to be related to either recently passed calculus or potential pyelonephritis".  Giving symptoms are not improving felt passed calculus much less likely.  Patient was started on antibiotic regimen for pyelonephritis.  Notes symptoms improved daily and mostly resolved.  Notes this morning waking up with increased pain of right side, mostly aching in nature.  Has noted this feels different from prior pain.  Notes discomfort with movement.  Denies fever, chills, nausea or vomiting.  Denies any urinary urgency, frequency, dysuria, hematuria, suprapubic pressure or pain.  Bowel movements are regular.  Denies any trauma or injury to the area.  Past Medical History:  Diagnosis Date  . Anxiety   . Asthma   . Chronic headache   . Depression   . History of chickenpox   . Lupus ([place])   . Thyroid disease   . UTI (lower urinary tract infection)     Current Outpatient Medications on File Prior to Visit  Medication Sig Dispense Refill  . albuterol (VENTOLIN HFA) 108 (90 Base) MCG/ACT inhaler  Inhale 1-2 puffs into the lungs every 6 (six) hours as needed for wheezing or shortness of breath.    . ARIPiprazole (ABILIFY) 10 MG tablet TAKE 1 TABLET BY MOUTH EVERY DAY 90 tablet 0  . brexpiprazole (REXULTI) 1 MG TABS tablet Take 1 tablet (1 mg total) by mouth in the morning. 30 tablet 0  . clonazePAM (KLONOPIN) 1 MG tablet 1 po tid prn and may take 1/2 po mid-day on occas prn. 105 tablet 1  . FLUoxetine (PROZAC) 40 MG capsule TAKE 2 CAPSULES BY MOUTH DAILY (Patient taking differently: Take 40 mg by mouth 2 (two) times daily. ) 180 capsule 1  . gabapentin (NEURONTIN) 600 MG tablet Take 1 tablet (600 mg total) by mouth 2 (two) times daily. 60 tablet 1  . hydroxychloroquine (PLAQUENIL) 200 MG tablet Take 100 mg by mouth daily.    Marland Kitchen levothyroxine (SYNTHROID) 25 MCG tablet Take 1 tablet (25 mcg total) by mouth daily before breakfast. 90 tablet 0  . meloxicam (MOBIC) 15 MG tablet Take 1 tablet (15 mg total) by mouth daily. 15 tablet 0  . modafinil (PROVIGIL) 200 MG tablet Take 1 tablet (200 mg total) by mouth daily. 30 tablet 0  . Multiple Vitamin (MULTIVITAMIN WITH MINERALS) TABS tablet Take 1 tablet by mouth daily.    . ondansetron (ZOFRAN ODT) 4 MG disintegrating tablet Take 1 tablet (4 mg total) by mouth every 8 (eight) hours as needed for nausea. 10 tablet 0  . promethazine (PHENERGAN) 25 MG tablet Take 1 tablet (25 mg total) by mouth every 6 (six) hours as needed for nausea  or vomiting. 30 tablet 0  . traZODone (DESYREL) 100 MG tablet Take 1 tablet (100 mg total) by mouth at bedtime as needed for sleep. 180 tablet 1   No current facility-administered medications on file prior to visit.    No Known Allergies  Family History  Problem Relation Age of Onset  . Heart disease Father   . Depression Father   . Heart attack Father   . Hyperlipidemia Father   . Hypertension Father   . Hypertension Mother   . Depression Mother   . Cancer Maternal Aunt        Breast Cancer  . Healthy Sister     . Cancer Paternal Grandfather        Skin    Social History   Socioeconomic History  . Marital status: Married    Spouse name: Not on file  . Number of children: Not on file  . Years of education: Not on file  . Highest education level: Not on file  Occupational History  . Not on file  Tobacco Use  . Smoking status: Never Smoker  . Smokeless tobacco: Never Used  Substance and Sexual Activity  . Alcohol use: No    Comment: rarely  . Drug use: No  . Sexual activity: Yes  Other Topics Concern  . Not on file  Social History Narrative  . Not on file   Social Determinants of Health   Financial Resource Strain:   . Difficulty of Paying Living Expenses:   Food Insecurity:   . Worried About Charity fundraiser in the Last Year:   . Arboriculturist in the Last Year:   Transportation Needs:   . Film/video editor (Medical):   Marland Kitchen Lack of Transportation (Non-Medical):   Physical Activity:   . Days of Exercise per Week:   . Minutes of Exercise per Session:   Stress:   . Feeling of Stress :   Social Connections:   . Frequency of Communication with Friends and Family:   . Frequency of Social Gatherings with Friends and Family:   . Attends Religious Services:   . Active Member of Clubs or Organizations:   . Attends Archivist Meetings:   Marland Kitchen Marital Status:    Review of Systems - See HPI.  All other ROS are negative.  Wt 95 lb (43.1 kg)   BMI 18.55 kg/m   Physical Exam Vitals reviewed.  Constitutional:      Appearance: Normal appearance.  HENT:     Head: Normocephalic and atraumatic.  Eyes:     Pupils: Pupils are equal, round, and reactive to light.  Cardiovascular:     Rate and Rhythm: Normal rate and regular rhythm.     Pulses: Normal pulses.     Heart sounds: Normal heart sounds.  Pulmonary:     Effort: Pulmonary effort is normal.     Breath sounds: Normal breath sounds.  Abdominal:     General: Bowel sounds are normal. There is no distension.      Palpations: Abdomen is soft. There is no mass.     Tenderness: There is no abdominal tenderness. There is no right CVA tenderness, left CVA tenderness, guarding or rebound.     Hernia: No hernia is present.  Musculoskeletal:       Arms:     Cervical back: Neck supple.  Neurological:     General: No focal deficit present.     Mental Status: She is alert and  oriented to person, place, and time.  Psychiatric:        Mood and Affect: Mood normal.     Recent Results (from the past 2160 hour(s))  POCT Urinalysis Dipstick     Status: Abnormal   Collection Time: 08/05/19  1:42 PM  Result Value Ref Range   Color, UA yellow    Clarity, UA clear    Glucose, UA Negative Negative   Bilirubin, UA negative    Ketones, UA negative    Spec Grav, UA 1.010 1.010 - 1.025   Blood, UA trace     Comment: just finishing menstrual cycle   pH, UA 6.0 5.0 - 8.0   Protein, UA Negative Negative   Urobilinogen, UA 0.2 0.2 or 1.0 E.U./dL   Nitrite, UA negative    Leukocytes, UA Negative Negative   Appearance     Odor    Urine Culture     Status: Abnormal   Collection Time: 08/05/19  2:07 PM   Specimen: Urine  Result Value Ref Range   MICRO NUMBER: [id number]    SPECIMEN QUALITY: Adequate    Sample Source URINE    STATUS: FINAL    ISOLATE 1: Escherichia coli (A)     Comment: 10,000-49,000 CFU/mL of Escherichia coli      Susceptibility   Escherichia coli - URINE CULTURE, REFLEX    AMOX/CLAVULANIC 4 Sensitive     AMPICILLIN 8 Sensitive     AMPICILLIN/SULBACTAM 4 Sensitive     CEFAZOLIN* <=4 Not Reportable      * For infections other than uncomplicated UTIcaused by E. coli, K. pneumoniae or P. mirabilis:Cefazolin is resistant if MIC > or = 8 mcg/mL.(Distinguishing susceptible versus intermediatefor isolates with MIC < or = 4 mcg/mL requiresadditional testing.)For uncomplicated UTI caused by E. coli,K. pneumoniae or P. mirabilis: Cefazolin issusceptible if MIC <32 mcg/mL and predictssusceptible to the  oral agents cefaclor, cefdinir,cefpodoxime, cefprozil, cefuroxime, cephalexinand loracarbef.    CEFEPIME <=1 Sensitive     CEFTRIAXONE <=1 Sensitive     CIPROFLOXACIN <=0.25 Sensitive     LEVOFLOXACIN <=0.12 Sensitive     ERTAPENEM <=0.5 Sensitive     GENTAMICIN <=1 Sensitive     IMIPENEM <=0.25 Sensitive     NITROFURANTOIN <=16 Sensitive     PIP/TAZO <=4 Sensitive     TOBRAMYCIN <=1 Sensitive     TRIMETH/SULFA* <=20 Sensitive      * For infections other than uncomplicated UTIcaused by E. coli, K. pneumoniae or P. mirabilis:Cefazolin is resistant if MIC > or = 8 mcg/mL.(Distinguishing susceptible versus intermediatefor isolates with MIC < or = 4 mcg/mL requiresadditional testing.)For uncomplicated UTI caused by E. coli,K. pneumoniae or P. mirabilis: Cefazolin issusceptible if MIC <32 mcg/mL and predictssusceptible to the oral agents cefaclor, cefdinir,cefpodoxime, cefprozil, cefuroxime, cephalexinand loracarbef.Legend:S = Susceptible  I = IntermediateR = Resistant  NS = Not susceptible* = Not tested  NR = Not reported**NN = See antimicrobic comments  POCT urinalysis dipstick     Status: Abnormal   Collection Time: 09/07/19 11:53 AM  Result Value Ref Range   Color, UA straw    Clarity, UA clear    Glucose, UA Negative Negative   Bilirubin, UA negative    Ketones, UA negative    Spec Grav, UA <=1.005 (A) 1.010 - 1.025   Blood, UA trace    pH, UA 7.0 5.0 - 8.0   Protein, UA Negative Negative   Urobilinogen, UA 0.2 0.2 or 1.0 E.U./dL   Nitrite, UA negative  Leukocytes, UA Negative Negative   Appearance     Odor    CBC w/Diff     Status: Abnormal   Collection Time: 09/07/19 12:07 PM  Result Value Ref Range   WBC 7.4 4.0 - 10.5 K/uL   RBC 4.36 3.87 - 5.11 Mil/uL   Hemoglobin 13.3 12.0 - 15.0 g/dL   HCT 40.2 36.0 - 46.0 %   MCV 92.2 78.0 - 100.0 fl   MCHC 33.2 30.0 - 36.0 g/dL   RDW 13.2 11.5 - 15.5 %   Platelets 290.0 150.0 - 400.0 K/uL   Neutrophils Relative % 54.8 43.0 - 77.0 %    Lymphocytes Relative 19.6 12.0 - 46.0 %   Monocytes Relative 5.6 3.0 - 12.0 %   Eosinophils Relative 18.5 (H) 0.0 - 5.0 %    Comment: smear reviewed   Basophils Relative 1.5 0.0 - 3.0 %   Neutro Abs 4.0 1.4 - 7.7 K/uL   Lymphs Abs 1.4 0.7 - 4.0 K/uL   Monocytes Absolute 0.4 0.1 - 1.0 K/uL   Eosinophils Absolute 1.4 (H) 0.0 - 0.7 K/uL   Basophils Absolute 0.1 0.0 - 0.1 K/uL  Comp Met (CMET)     Status: Abnormal   Collection Time: 09/07/19 12:07 PM  Result Value Ref Range   Sodium 140 135 - 145 mEq/L   Potassium 4.3 3.5 - 5.1 mEq/L   Chloride 104 96 - 112 mEq/L   CO2 29 19 - 32 mEq/L   Glucose, Bld 67 (L) 70 - 99 mg/dL   BUN 6 6 - 23 mg/dL   Creatinine, Ser 0.69 0.40 - 1.20 mg/dL   Total Bilirubin 0.3 0.2 - 1.2 mg/dL   Alkaline Phosphatase 156 (H) 39 - 117 U/L   AST 14 0 - 37 U/L   ALT 8 0 - 35 U/L   Total Protein 7.0 6.0 - 8.3 g/dL   Albumin 4.4 3.5 - 5.2 g/dL   GFR 98.23 >60.00 mL/min   Calcium 9.8 8.4 - 10.5 mg/dL   Assessment/Plan: 1. Flank pain Patient has completed course of antibiotic.  Again there are no other signs or symptoms that would indicate pyelonephritis, neither urinary nor systemic.  Repeat UA today negative for sign of infection.  Will send for culture to make certain.  UA did show some blood.  Will send for micro to validate this as this can be a false positive.  If urine micro positive for blood we will have her have assessment with urology.  For now the bulk of her symptoms seem to be musculoskeletal in nature giving exam findings.  We will start her on etodolac up to 3 times daily as needed for the next 5 days.  Tylenol for breakthrough pain.  Avoid heavy lifting or overexertion.  Strict ER precautions reviewed with patient. - POCT Urinalysis Dipstick - etodolac (LODINE) 200 MG capsule; Take 1 capsule (200 mg total) by mouth every 8 (eight) hours.  Dispense: 15 capsule; Refill: 0 - Urine Culture - Urine Microscopic Only  2. Elevated alkaline phosphatase  level Noted on last labs.  Her ultrasound was negative.  1 a recheck today along with GGT and vitamin D level. - Hepatic function panel - Vitamin D (25 hydroxy) - Gamma GT  This visit occurred during the SARS-CoV-2 public health emergency.  Safety protocols were in place, including screening questions prior to the visit, additional usage of staff PPE, and extensive cleaning of exam room while observing appropriate contact time as indicated for disinfecting  solutions.     Leeanne Rio, PA-C

## 2019-09-20 NOTE — Patient Instructions (Signed)
Please go to the lab today for blood work.  I will call you with your results. We will alter treatment regimen(s) if indicated by your results.   I am rechecking your liver enzymes as one was elevated at last check. This can also be elevated with Vitamin D deficiency so we are checking this as well today.   Please take the Etodolac as directed to help with the intercostal muscular tenderness.   I am sending your urine to be looked at under a microscope to make sure there is no microscopic blood in the urine. If this is + I will want to have you see Urology.   Again, I will call you as soon as I have results.

## 2019-09-21 ENCOUNTER — Other Ambulatory Visit: Payer: Self-pay

## 2019-09-21 DIAGNOSIS — R748 Abnormal levels of other serum enzymes: Secondary | ICD-10-CM

## 2019-09-21 LAB — URINALYSIS, MICROSCOPIC ONLY
RBC / HPF: NONE SEEN (ref 0–?)
WBC, UA: NONE SEEN (ref 0–?)

## 2019-09-21 LAB — URINE CULTURE
MICRO NUMBER:: 10410864
Result:: NO GROWTH
SPECIMEN QUALITY:: ADEQUATE

## 2019-09-21 LAB — HEPATIC FUNCTION PANEL
ALT: 9 U/L (ref 0–35)
AST: 17 U/L (ref 0–37)
Albumin: 4.7 g/dL (ref 3.5–5.2)
Alkaline Phosphatase: 159 U/L — ABNORMAL HIGH (ref 39–117)
Bilirubin, Direct: 0.1 mg/dL (ref 0.0–0.3)
Total Bilirubin: 0.3 mg/dL (ref 0.2–1.2)
Total Protein: 7.6 g/dL (ref 6.0–8.3)

## 2019-09-21 LAB — GAMMA GT: GGT: 12 U/L (ref 7–51)

## 2019-09-21 LAB — VITAMIN D 25 HYDROXY (VIT D DEFICIENCY, FRACTURES): VITD: 49.21 ng/mL (ref 30.00–100.00)

## 2019-09-22 ENCOUNTER — Ambulatory Visit (INDEPENDENT_AMBULATORY_CARE_PROVIDER_SITE_OTHER): Payer: 59

## 2019-09-22 ENCOUNTER — Other Ambulatory Visit: Payer: Self-pay

## 2019-09-22 DIAGNOSIS — R748 Abnormal levels of other serum enzymes: Secondary | ICD-10-CM

## 2019-09-22 LAB — TSH: TSH: 0.96 u[IU]/mL (ref 0.35–4.50)

## 2019-09-23 LAB — PTH, INTACT AND CALCIUM
Calcium: 10.2 mg/dL (ref 8.6–10.2)
PTH: 12 pg/mL — ABNORMAL LOW (ref 14–64)

## 2019-09-28 ENCOUNTER — Other Ambulatory Visit: Payer: Self-pay

## 2019-09-28 DIAGNOSIS — R748 Abnormal levels of other serum enzymes: Secondary | ICD-10-CM

## 2019-09-30 ENCOUNTER — Other Ambulatory Visit: Payer: Self-pay

## 2019-09-30 ENCOUNTER — Encounter: Payer: Self-pay | Admitting: Physician Assistant

## 2019-09-30 ENCOUNTER — Ambulatory Visit (INDEPENDENT_AMBULATORY_CARE_PROVIDER_SITE_OTHER): Payer: 59 | Admitting: Physician Assistant

## 2019-09-30 DIAGNOSIS — F900 Attention-deficit hyperactivity disorder, predominantly inattentive type: Secondary | ICD-10-CM | POA: Diagnosis not present

## 2019-09-30 DIAGNOSIS — F4310 Post-traumatic stress disorder, unspecified: Secondary | ICD-10-CM

## 2019-09-30 DIAGNOSIS — F331 Major depressive disorder, recurrent, moderate: Secondary | ICD-10-CM | POA: Diagnosis not present

## 2019-09-30 DIAGNOSIS — F411 Generalized anxiety disorder: Secondary | ICD-10-CM | POA: Diagnosis not present

## 2019-09-30 MED ORDER — BREXPIPRAZOLE 2 MG PO TABS
2.0000 mg | ORAL_TABLET | Freq: Every day | ORAL | 1 refills | Status: DC
Start: 2019-09-30 — End: 2019-12-14

## 2019-09-30 NOTE — Progress Notes (Signed)
[facility]  Patient ID: Valerie Evans,  MRN: [id number]  PCP: Waldon Merl, PA-C  Date of Evaluation: 09/30/2019 Time spent:20 minutes  Chief Complaint:  Chief Complaint    Follow-up     HISTORY/CURRENT STATUS: For f/u after starting Rexulti.  Has had a hard week.  Wants to stay on the couch all the time.  More tearful now and crying to other people.  "usually I'm able to keep it together until I'm alone, but now I can't help but cry in front of close friends." Is trying to decide about a job. Has an offer with [employer], part time. "It would be a good job but I just don't know if I could do a job right now. I hurt everyday from the lupus." Has also had pyelonephritis since LOV.  And dx with kidney stones.  LFTs were up too so that is being watched. Has applied for disability but hasn't heard anything yet.   Dates she never feels happy.  She is always tired and has no motivation.  Does not even feel like getting up to get something to eat.  Has maintained her weight.  She occasionally does something with a friend but is not common.  The rest of the time she isolates at home.  No suicidal or homicidal thoughts.  Patient denies increased energy with decreased need for sleep, no increased talkativeness, no racing thoughts, no impulsivity or risky behaviors, no increased spending, no increased libido, no grandiosity, no increased irritability or anger, and no hallucinations.  Anxiety is still a problem.  The Klonopin does help but states is not as good as the Xanax.  It does last a bit longer though.  The anxiety is more generalized, not having panic attacks so much.  Denies dizziness, syncope, seizures, numbness, tingling, tremor, tics, unsteady gait, slurred speech, confusion. Denies muscle or joint pain, stiffness, or dystonia.  Individual Medical History/ Review of Systems: Changes? :Yes  see above.  Past medications for mental health diagnoses  include: Cymbalta, Zoloft, Wellbutrin, Xanax, trazodone, Lexapro, Elavil, Sonata Klonopin, Gabapentin, Abilify.   Allergies: Patient has no known allergies.  Current Medications:  Current Outpatient Medications:  .  albuterol (VENTOLIN HFA) 108 (90 Base) MCG/ACT inhaler, Inhale 1-2 puffs into the lungs every 6 (six) hours as needed for wheezing or shortness of breath., Disp: , Rfl:  .  clonazePAM (KLONOPIN) 1 MG tablet, 1 po tid prn and may take 1/2 po mid-day on occas prn., Disp: 105 tablet, Rfl: 1 .  etodolac (LODINE) 200 MG capsule, Take 1 capsule (200 mg total) by mouth every 8 (eight) hours., Disp: 15 capsule, Rfl: 0 .  FLUoxetine (PROZAC) 40 MG capsule, TAKE 2 CAPSULES BY MOUTH DAILY (Patient taking differently: Take 40 mg by mouth 2 (two) times daily. ), Disp: 180 capsule, Rfl: 1 .  gabapentin (NEURONTIN) 600 MG tablet, Take 1 tablet (600 mg total) by mouth 2 (two) times daily., Disp: 60 tablet, Rfl: 1 .  hydroxychloroquine (PLAQUENIL) 200 MG tablet, Take 100 mg by mouth daily., Disp: , Rfl:  .  levothyroxine (SYNTHROID) 25 MCG tablet, Take 1 tablet (25 mcg total) by mouth daily before breakfast., Disp: 90 tablet, Rfl: 0 .  modafinil (PROVIGIL) 200 MG tablet, Take 1 tablet (200 mg total) by mouth daily., Disp: 30 tablet, Rfl: 0 .  Multiple Vitamin (MULTIVITAMIN WITH MINERALS) TABS tablet, Take 1 tablet by mouth daily., Disp: , Rfl:  .  ondansetron (ZOFRAN ODT) 4 MG disintegrating tablet,  Take 1 tablet (4 mg total) by mouth every 8 (eight) hours as needed for nausea., Disp: 10 tablet, Rfl: 0 .  promethazine (PHENERGAN) 25 MG tablet, Take 1 tablet (25 mg total) by mouth every 6 (six) hours as needed for nausea or vomiting., Disp: 30 tablet, Rfl: 0 .  traZODone (DESYREL) 100 MG tablet, Take 1 tablet (100 mg total) by mouth at bedtime as needed for sleep., Disp: 180 tablet, Rfl: 1 .  brexpiprazole (REXULTI) 2 MG TABS tablet, Take 1 tablet (2 mg total) by mouth daily., Disp: 30 tablet, Rfl:  1 Medication Side Effects: none  Family Medical/ Social History: Changes? See HPI  MENTAL HEALTH EXAM:  There were no vitals taken for this visit.There is no height or weight on file to calculate BMI.  General Appearance: Casual, Neat and Well Groomed  Eye Contact:  Good  Speech:  Clear and Coherent  Volume:  Normal  Mood:  Depressed  Affect:  Depressed, Tearful and Consolable  Thought Process:  Goal Directed and Descriptions of Associations: Intact  Orientation:  Full (Time, Place, and Person)  Thought Content: Logical   Suicidal Thoughts:  No  Homicidal Thoughts:  No  Memory:  WNL  Judgement:  Good  Insight:  Good  Psychomotor Activity:  Normal  Concentration:  Concentration: Good and Attention Span: Good  Recall:  Good  Fund of Knowledge: Good  Language: Good  Assets:  Desire for Improvement  ADL's:  Intact  Cognition: WNL  Prognosis:  Good    DIAGNOSES:    ICD-10-CM   1. Major depressive disorder, recurrent episode, moderate (HCC)  F33.1   2. PTSD (post-traumatic stress disorder)  F43.10   3. Generalized anxiety disorder  F41.1   4. Attention deficit hyperactivity disorder (ADHD), predominantly inattentive type  F90.0     Receiving Psychotherapy: No    RECOMMENDATIONS:  I spent 20 minutes with her. PDMP was reviewed. Increase Rexulti to 2 mg p.o. every morning. Continue Klonopin 1 mg 3 times daily as needed and one half midday as needed. Continue Prozac 40 mg, 2 p.o. daily. Continue modafinil 200 mg 1 p.o. every morning. I am referring her to the first available counselor here at [facility]. Return in 4 weeks.  Donnal Moat, PA-C

## 2019-10-10 ENCOUNTER — Other Ambulatory Visit: Payer: Self-pay | Admitting: Physician Assistant

## 2019-10-13 ENCOUNTER — Other Ambulatory Visit: Payer: Self-pay | Admitting: Physician Assistant

## 2019-10-13 ENCOUNTER — Other Ambulatory Visit: Payer: Self-pay

## 2019-10-13 MED ORDER — ALBUTEROL SULFATE HFA 108 (90 BASE) MCG/ACT IN AERS: 1.0000 | INHALATION_SPRAY | Freq: Four times a day (QID) | RESPIRATORY_TRACT | 1 refills | Status: DC | PRN

## 2019-10-13 NOTE — Telephone Encounter (Signed)
Patient would like refill on her albuterol. She states she used to get this filled by her previous pcp and has run out of refills. Okay to fill?

## 2019-10-18 ENCOUNTER — Other Ambulatory Visit: Payer: Self-pay | Admitting: Physician Assistant

## 2019-10-19 ENCOUNTER — Other Ambulatory Visit: Payer: Self-pay | Admitting: Physician Assistant

## 2019-10-25 ENCOUNTER — Other Ambulatory Visit: Payer: Self-pay

## 2019-10-25 ENCOUNTER — Encounter: Payer: Self-pay | Admitting: Physician Assistant

## 2019-10-25 ENCOUNTER — Ambulatory Visit (INDEPENDENT_AMBULATORY_CARE_PROVIDER_SITE_OTHER): Payer: 59 | Admitting: Physician Assistant

## 2019-10-25 VITALS — BP 101/70 | HR 77 | Temp 97.1°F | Resp 16 | Ht 60.0 in | Wt 94.2 lb

## 2019-10-25 DIAGNOSIS — N3001 Acute cystitis with hematuria: Secondary | ICD-10-CM

## 2019-10-25 DIAGNOSIS — R82998 Other abnormal findings in urine: Secondary | ICD-10-CM | POA: Diagnosis not present

## 2019-10-25 LAB — POCT URINALYSIS DIPSTICK
Glucose, UA: NEGATIVE
Ketones, UA: NEGATIVE
Protein, UA: NEGATIVE
Spec Grav, UA: <=1.005 — AB (ref 1.010–1.025)
Urobilinogen, UA: 2 E.U./dL — AB
pH, UA: 7 (ref 5.0–8.0)

## 2019-10-25 MED ORDER — NITROFURANTOIN MONOHYD MACRO 100 MG PO CAPS: 100.0000 mg | ORAL_CAPSULE | Freq: Two times a day (BID) | ORAL | 0 refills | Status: DC

## 2019-10-25 NOTE — Patient Instructions (Signed)
Your symptoms are consistent with a bladder infection, also called acute cystitis. Please take your antibiotic (Macrobid) as directed until all pills are gone.  Stay very well hydrated.  Consider a daily probiotic (Align, Culturelle, or Activia) to help prevent stomach upset caused by the antibiotic.  Taking a probiotic daily may also help prevent recurrent UTIs.  Also consider taking AZO (Phenazopyridine) tablets to help decrease pain with urination.  I will call you with your urine testing results.  We will change antibiotics if indicated.  Call or return to clinic if symptoms are not resolved by completion of antibiotic.   Urinary Tract Infection A urinary tract infection (UTI) can occur any place along the urinary tract. The tract includes the kidneys, ureters, bladder, and urethra. A type of germ called bacteria often causes a UTI. UTIs are often helped with antibiotic medicine.  HOME CARE   If given, take antibiotics as told by your doctor. Finish them even if you start to feel better.  Drink enough fluids to keep your pee (urine) clear or pale yellow.  Avoid tea, drinks with caffeine, and bubbly (carbonated) drinks.  Pee often. Avoid holding your pee in for a long time.  Pee before and after having sex (intercourse).  Wipe from front to back after you poop (bowel movement) if you are a woman. Use each tissue only once. GET HELP RIGHT AWAY IF:   You have back pain.  You have lower belly (abdominal) pain.  You have chills.  You feel sick to your stomach (nauseous).  You throw up (vomit).  Your burning or discomfort with peeing does not go away.  You have a fever.  Your symptoms are not better in 3 days. MAKE SURE YOU:   Understand these instructions.  Will watch your condition.  Will get help right away if you are not doing well or get worse. Document Released: 10/29/2007 Document Revised: 02/04/2012 Document Reviewed: 12/11/2011 [employer] Patient Information 2015  [employer]. This information is not intended to replace advice given to you by your health care provider. Make sure you discuss any questions you have with your health care provider.   

## 2019-10-25 NOTE — Progress Notes (Signed)
Patient presents to clinic today c/o few days of dysuria, urinary urgency and frequency with hesitancy.  Also noting some suprapubic pressure.  Denies fever, chills, nausea or vomiting.  Denies flank pain.  Denies vaginal symptoms.  Denies sexual activity prior to symptom onset.  Has been trying to keep well-hydrated.  Has been taking Azo OTC with some relief of symptoms.  Past Medical History:  Diagnosis Date  . Anxiety   . Asthma   . Chronic headache   . Depression   . History of chickenpox   . Lupus ([place])   . Thyroid disease   . UTI (lower urinary tract infection)     Current Outpatient Medications on File Prior to Visit  Medication Sig Dispense Refill  . albuterol (VENTOLIN HFA) 108 (90 Base) MCG/ACT inhaler Inhale 1-2 puffs into the lungs every 6 (six) hours as needed for wheezing or shortness of breath. 6.7 g 1  . brexpiprazole (REXULTI) 2 MG TABS tablet Take 1 tablet (2 mg total) by mouth daily. 30 tablet 1  . clonazePAM (KLONOPIN) 1 MG tablet 1 po tid prn and may take 1/2 po mid-day on occas prn. 105 tablet 1  . etodolac (LODINE) 200 MG capsule Take 1 capsule (200 mg total) by mouth every 8 (eight) hours. 15 capsule 0  . FLUoxetine (PROZAC) 40 MG capsule TAKE 2 CAPSULES BY MOUTH EVERY DAY 180 capsule 0  . gabapentin (NEURONTIN) 600 MG tablet Take 1 tablet (600 mg total) by mouth 2 (two) times daily. 60 tablet 1  . hydroxychloroquine (PLAQUENIL) 200 MG tablet Take 100 mg by mouth daily.    Marland Kitchen levothyroxine (SYNTHROID) 25 MCG tablet TAKE 1 TABLET BY MOUTH DAILY BEFORE BREAKFAST. 90 tablet 0  . modafinil (PROVIGIL) 200 MG tablet TAKE ONE TABLET BY MOUTH DAILY 30 tablet 0  . Multiple Vitamin (MULTIVITAMIN WITH MINERALS) TABS tablet Take 1 tablet by mouth daily.    . ondansetron (ZOFRAN ODT) 4 MG disintegrating tablet Take 1 tablet (4 mg total) by mouth every 8 (eight) hours as needed for nausea. 10 tablet 0  . promethazine (PHENERGAN) 25 MG tablet Take 1 tablet (25 mg total) by  mouth every 6 (six) hours as needed for nausea or vomiting. 30 tablet 0  . traZODone (DESYREL) 100 MG tablet Take 1 tablet (100 mg total) by mouth at bedtime as needed for sleep. 180 tablet 1   No current facility-administered medications on file prior to visit.    No Known Allergies  Family History  Problem Relation Age of Onset  . Heart disease Father   . Depression Father   . Heart attack Father   . Hyperlipidemia Father   . Hypertension Father   . Hypertension Mother   . Depression Mother   . Cancer Maternal Aunt        Breast Cancer  . Healthy Sister   . Cancer Paternal Grandfather        Skin    Social History   Socioeconomic History  . Marital status: Married    Spouse name: Not on file  . Number of children: Not on file  . Years of education: Not on file  . Highest education level: Not on file  Occupational History  . Not on file  Tobacco Use  . Smoking status: Never Smoker  . Smokeless tobacco: Never Used  Substance and Sexual Activity  . Alcohol use: No    Comment: rarely  . Drug use: No  . Sexual activity: Yes  Other Topics Concern  . Not on file  Social History Narrative  . Not on file   Social Determinants of Health   Financial Resource Strain:   . Difficulty of Paying Living Expenses:   Food Insecurity:   . Worried About Charity fundraiser in the Last Year:   . Arboriculturist in the Last Year:   Transportation Needs:   . Film/video editor (Medical):   Marland Kitchen Lack of Transportation (Non-Medical):   Physical Activity:   . Days of Exercise per Week:   . Minutes of Exercise per Session:   Stress:   . Feeling of Stress :   Social Connections:   . Frequency of Communication with Friends and Family:   . Frequency of Social Gatherings with Friends and Family:   . Attends Religious Services:   . Active Member of Clubs or Organizations:   . Attends Archivist Meetings:   Marland Kitchen Marital Status:    Review of Systems - See HPI.  All other  ROS are negative.  BP 101/70   Pulse 77   Temp (!) 97.1 F (36.2 C) (Skin)   Resp 16   Ht 5' (1.524 m)   Wt 94 lb 4 oz (42.8 kg)   SpO2 98%   BMI 18.41 kg/m   Physical Exam Vitals reviewed.  Constitutional:      Appearance: Normal appearance.  HENT:     Head: Normocephalic and atraumatic.  Eyes:     Conjunctiva/sclera: Conjunctivae normal.  Cardiovascular:     Rate and Rhythm: Normal rate and regular rhythm.  Pulmonary:     Effort: Pulmonary effort is normal.  Abdominal:     General: Bowel sounds are normal. There is no distension.     Palpations: Abdomen is soft.     Tenderness: There is no abdominal tenderness. There is no right CVA tenderness or left CVA tenderness.  Musculoskeletal:     Cervical back: Neck supple.  Neurological:     Mental Status: She is alert.  Psychiatric:        Mood and Affect: Mood normal.     Recent Results (from the past 2160 hour(s))  POCT Urinalysis Dipstick     Status: Abnormal   Collection Time: 08/05/19  1:42 PM  Result Value Ref Range   Color, UA yellow    Clarity, UA clear    Glucose, UA Negative Negative   Bilirubin, UA negative    Ketones, UA negative    Spec Grav, UA 1.010 1.010 - 1.025   Blood, UA trace     Comment: just finishing menstrual cycle   pH, UA 6.0 5.0 - 8.0   Protein, UA Negative Negative   Urobilinogen, UA 0.2 0.2 or 1.0 E.U./dL   Nitrite, UA negative    Leukocytes, UA Negative Negative   Appearance     Odor    Urine Culture     Status: Abnormal   Collection Time: 08/05/19  2:07 PM   Specimen: Urine  Result Value Ref Range   MICRO NUMBER: [id number]    SPECIMEN QUALITY: Adequate    Sample Source URINE    STATUS: FINAL    ISOLATE 1: Escherichia coli (A)     Comment: 10,000-49,000 CFU/mL of Escherichia coli      Susceptibility   Escherichia coli - URINE CULTURE, REFLEX    AMOX/CLAVULANIC 4 Sensitive     AMPICILLIN 8 Sensitive     AMPICILLIN/SULBACTAM 4 Sensitive     CEFAZOLIN* <=  4 Not Reportable        * For infections other than uncomplicated UTIcaused by E. coli, K. pneumoniae or P. mirabilis:Cefazolin is resistant if MIC > or = 8 mcg/mL.(Distinguishing susceptible versus intermediatefor isolates with MIC < or = 4 mcg/mL requiresadditional testing.)For uncomplicated UTI caused by E. coli,K. pneumoniae or P. mirabilis: Cefazolin issusceptible if MIC <32 mcg/mL and predictssusceptible to the oral agents cefaclor, cefdinir,cefpodoxime, cefprozil, cefuroxime, cephalexinand loracarbef.    CEFEPIME <=1 Sensitive     CEFTRIAXONE <=1 Sensitive     CIPROFLOXACIN <=0.25 Sensitive     LEVOFLOXACIN <=0.12 Sensitive     ERTAPENEM <=0.5 Sensitive     GENTAMICIN <=1 Sensitive     IMIPENEM <=0.25 Sensitive     NITROFURANTOIN <=16 Sensitive     PIP/TAZO <=4 Sensitive     TOBRAMYCIN <=1 Sensitive     TRIMETH/SULFA* <=20 Sensitive      * For infections other than uncomplicated UTIcaused by E. coli, K. pneumoniae or P. mirabilis:Cefazolin is resistant if MIC > or = 8 mcg/mL.(Distinguishing susceptible versus intermediatefor isolates with MIC < or = 4 mcg/mL requiresadditional testing.)For uncomplicated UTI caused by E. coli,K. pneumoniae or P. mirabilis: Cefazolin issusceptible if MIC <32 mcg/mL and predictssusceptible to the oral agents cefaclor, cefdinir,cefpodoxime, cefprozil, cefuroxime, cephalexinand loracarbef.Legend:S = Susceptible  I = IntermediateR = Resistant  NS = Not susceptible* = Not tested  NR = Not reported**NN = See antimicrobic comments  POCT urinalysis dipstick     Status: Abnormal   Collection Time: 09/07/19 11:53 AM  Result Value Ref Range   Color, UA straw    Clarity, UA clear    Glucose, UA Negative Negative   Bilirubin, UA negative    Ketones, UA negative    Spec Grav, UA <=1.005 (A) 1.010 - 1.025   Blood, UA trace    pH, UA 7.0 5.0 - 8.0   Protein, UA Negative Negative   Urobilinogen, UA 0.2 0.2 or 1.0 E.U./dL   Nitrite, UA negative    Leukocytes, UA Negative Negative    Appearance     Odor    CBC w/Diff     Status: Abnormal   Collection Time: 09/07/19 12:07 PM  Result Value Ref Range   WBC 7.4 4.0 - 10.5 K/uL   RBC 4.36 3.87 - 5.11 Mil/uL   Hemoglobin 13.3 12.0 - 15.0 g/dL   HCT 40.2 36.0 - 46.0 %   MCV 92.2 78.0 - 100.0 fl   MCHC 33.2 30.0 - 36.0 g/dL   RDW 13.2 11.5 - 15.5 %   Platelets 290.0 150.0 - 400.0 K/uL   Neutrophils Relative % 54.8 43.0 - 77.0 %   Lymphocytes Relative 19.6 12.0 - 46.0 %   Monocytes Relative 5.6 3.0 - 12.0 %   Eosinophils Relative 18.5 (H) 0.0 - 5.0 %    Comment: smear reviewed   Basophils Relative 1.5 0.0 - 3.0 %   Neutro Abs 4.0 1.4 - 7.7 K/uL   Lymphs Abs 1.4 0.7 - 4.0 K/uL   Monocytes Absolute 0.4 0.1 - 1.0 K/uL   Eosinophils Absolute 1.4 (H) 0.0 - 0.7 K/uL   Basophils Absolute 0.1 0.0 - 0.1 K/uL  Comp Met (CMET)     Status: Abnormal   Collection Time: 09/07/19 12:07 PM  Result Value Ref Range   Sodium 140 135 - 145 mEq/L   Potassium 4.3 3.5 - 5.1 mEq/L   Chloride 104 96 - 112 mEq/L   CO2 29 19 - 32 mEq/L  Glucose, Bld 67 (L) 70 - 99 mg/dL   BUN 6 6 - 23 mg/dL   Creatinine, Ser 0.69 0.40 - 1.20 mg/dL   Total Bilirubin 0.3 0.2 - 1.2 mg/dL   Alkaline Phosphatase 156 (H) 39 - 117 U/L   AST 14 0 - 37 U/L   ALT 8 0 - 35 U/L   Total Protein 7.0 6.0 - 8.3 g/dL   Albumin 4.4 3.5 - 5.2 g/dL   GFR 98.23 >60.00 mL/min   Calcium 9.8 8.4 - 10.5 mg/dL  Hepatic function panel     Status: Abnormal   Collection Time: 09/20/19  3:54 PM  Result Value Ref Range   Total Bilirubin 0.3 0.2 - 1.2 mg/dL   Bilirubin, Direct 0.1 0.0 - 0.3 mg/dL   Alkaline Phosphatase 159 (H) 39 - 117 U/L   AST 17 0 - 37 U/L   ALT 9 0 - 35 U/L   Total Protein 7.6 6.0 - 8.3 g/dL   Albumin 4.7 3.5 - 5.2 g/dL  Vitamin D (25 hydroxy)     Status: None   Collection Time: 09/20/19  3:54 PM  Result Value Ref Range   VITD 49.21 30.00 - 100.00 ng/mL  Gamma GT     Status: None   Collection Time: 09/20/19  3:54 PM  Result Value Ref Range   GGT 12 7  - 51 U/L  POCT Urinalysis Dipstick     Status: Abnormal   Collection Time: 09/20/19  3:59 PM  Result Value Ref Range   Color, UA light yellow    Clarity, UA clear    Glucose, UA Negative Negative   Bilirubin, UA Negative    Ketones, UA Negative    Spec Grav, UA <=1.005 (A) 1.010 - 1.025   Blood, UA 1+    pH, UA 6.5 5.0 - 8.0   Protein, UA Negative Negative   Urobilinogen, UA 0.2 0.2 or 1.0 E.U./dL   Nitrite, UA Negative    Leukocytes, UA Negative Negative   Appearance     Odor    Urine Culture     Status: None   Collection Time: 09/20/19  4:02 PM   Specimen: Urine  Result Value Ref Range   MICRO NUMBER: [id number]    SPECIMEN QUALITY: Adequate    Sample Source NOT GIVEN    STATUS: FINAL    Result: No Growth   Urine Microscopic Only     Status: None   Collection Time: 09/20/19  4:02 PM  Result Value Ref Range   WBC, UA none seen 0-2/hpf   RBC / HPF none seen 0-2/hpf   Squamous Epithelial / LPF Rare(0-4/hpf) Rare(0-4/hpf)  PTH, Intact and Calcium     Status: Abnormal   Collection Time: 09/22/19 11:27 AM  Result Value Ref Range   PTH 12 (L) 14 - 64 pg/mL    Comment: . Interpretive Guide    Intact PTH           Calcium ------------------    ----------           ------- Normal Parathyroid    Normal               Normal Hypoparathyroidism    Low or Low Normal    Low Hyperparathyroidism    Primary            Normal or High       High    Secondary          High  Normal or Low    Tertiary           High                 High Non-Parathyroid    Hypercalcemia      Low or Low Normal    High .    Calcium 10.2 8.6 - 10.2 mg/dL  TSH     Status: None   Collection Time: 09/22/19 11:27 AM  Result Value Ref Range   TSH 0.96 0.35 - 4.50 uIU/mL  POCT urinalysis dipstick     Status: Abnormal   Collection Time: 10/25/19  1:40 PM  Result Value Ref Range   Color, UA orange     Comment: AZO   Clarity, UA cloudy    Glucose, UA Negative Negative   Bilirubin, UA +     Ketones, UA negative    Spec Grav, UA <=1.005 (A) 1.010 - 1.025   Blood, UA +    pH, UA 7.0 5.0 - 8.0   Protein, UA Negative Negative   Urobilinogen, UA 2.0 (A) 0.2 or 1.0 E.U./dL   Nitrite, UA +    Leukocytes, UA Large (3+) (A) Negative   Appearance     Odor      Assessment/Plan: 1. Acute cystitis with hematuria Ua with LE, blood and nitrites. Will send for culture. Start Macrobid 100 mg BID x 5 days. Will alter based on culture results.  Supportive measures and OTC medications reviewed. - nitrofurantoin, macrocrystal-monohydrate, (MACROBID) 100 MG capsule; Take 1 capsule (100 mg total) by mouth 2 (two) times daily.  Dispense: 10 capsule; Refill: 0  This visit occurred during the SARS-CoV-2 public health emergency.  Safety protocols were in place, including screening questions prior to the visit, additional usage of staff PPE, and extensive cleaning of exam room while observing appropriate contact time as indicated for disinfecting solutions.     Leeanne Rio, PA-C

## 2019-10-27 ENCOUNTER — Ambulatory Visit: Payer: 59 | Admitting: Mental Health

## 2019-10-27 LAB — URINE CULTURE
MICRO NUMBER:: 10538992
SPECIMEN QUALITY:: ADEQUATE

## 2019-10-28 ENCOUNTER — Encounter: Payer: Self-pay | Admitting: Physician Assistant

## 2019-10-28 ENCOUNTER — Ambulatory Visit (INDEPENDENT_AMBULATORY_CARE_PROVIDER_SITE_OTHER): Payer: 59 | Admitting: Physician Assistant

## 2019-10-28 ENCOUNTER — Other Ambulatory Visit: Payer: Self-pay

## 2019-10-28 VITALS — BP 105/77 | HR 79

## 2019-10-28 DIAGNOSIS — F900 Attention-deficit hyperactivity disorder, predominantly inattentive type: Secondary | ICD-10-CM

## 2019-10-28 DIAGNOSIS — F411 Generalized anxiety disorder: Secondary | ICD-10-CM

## 2019-10-28 DIAGNOSIS — F4310 Post-traumatic stress disorder, unspecified: Secondary | ICD-10-CM | POA: Diagnosis not present

## 2019-10-28 DIAGNOSIS — F339 Major depressive disorder, recurrent, unspecified: Secondary | ICD-10-CM | POA: Diagnosis not present

## 2019-10-28 MED ORDER — CLONAZEPAM 1 MG PO TABS: ORAL_TABLET | ORAL | 1 refills | Status: DC

## 2019-10-28 MED ORDER — ATOMOXETINE HCL 25 MG PO CAPS
25.0000 mg | ORAL_CAPSULE | Freq: Every day | ORAL | 1 refills | Status: DC
Start: 2019-10-28 — End: 2019-11-21

## 2019-10-28 MED ORDER — GABAPENTIN 600 MG PO TABS: 600.0000 mg | ORAL_TABLET | Freq: Two times a day (BID) | ORAL | 1 refills | Status: DC

## 2019-10-28 NOTE — Progress Notes (Signed)
[facility] Med Check  Patient ID: Valerie Evans,  MRN: [id number]  PCP: Waldon Merl, PA-C  Date of Evaluation: 10/28/2019 Time spent:20 minutes  Chief Complaint:  Chief Complaint    Anxiety; Depression; Follow-up     HISTORY/CURRENT STATUS: For routine med check.  Patient got her job and will be starting next Wednesday.  At [place] [employer].  Is excited but nervous also.  States that she has a hard time focusing and cannot concentrate on anything.  She is scattered, having a hard time finishing tasks.  There was a question in the past whether she had ADD or not.  States she needs to focus well for this new job.  Mood is better.  She is able to enjoy things.  Energy and motivation are somewhat better but the modafinil does not seem to be working as well as it did initially.  Not crying easily.  No suicidal or homicidal thoughts.  Patient denies increased energy with decreased need for sleep, no increased talkativeness, no racing thoughts, no impulsivity or risky behaviors, no increased spending, no increased libido, no grandiosity, no increased irritability or anger, and no hallucinations.  Denies dizziness, syncope, seizures, numbness, tingling, tremor, tics, unsteady gait, slurred speech, confusion. Denies muscle or joint pain, stiffness, or dystonia.  Individual Medical History/ Review of Systems: Changes? :No    Past medications for mental health diagnoses include: Cymbalta, Zoloft, Wellbutrin, Xanax, trazodone, Lexapro, Elavil, [place] Klonopin, Gabapentin, Abilify.   Allergies: Patient has no known allergies.  Current Medications:  Current Outpatient Medications:    albuterol (VENTOLIN HFA) 108 (90 Base) MCG/ACT inhaler, Inhale 1-2 puffs into the lungs every 6 (six) hours as needed for wheezing or shortness of breath., Disp: 6.7 g, Rfl: 1   brexpiprazole (REXULTI) 2 MG TABS tablet, Take 1 tablet (2 mg total) by mouth daily., Disp: 30 tablet, Rfl: 1   clonazePAM  (KLONOPIN) 1 MG tablet, 1 po tid prn and may take 1/2 po mid-day on occas prn., Disp: 105 tablet, Rfl: 1   etodolac (LODINE) 200 MG capsule, Take 1 capsule (200 mg total) by mouth every 8 (eight) hours., Disp: 15 capsule, Rfl: 0   FLUoxetine (PROZAC) 40 MG capsule, TAKE 2 CAPSULES BY MOUTH EVERY DAY, Disp: 180 capsule, Rfl: 0   gabapentin (NEURONTIN) 600 MG tablet, Take 1 tablet (600 mg total) by mouth 2 (two) times daily., Disp: 60 tablet, Rfl: 1   hydroxychloroquine (PLAQUENIL) 200 MG tablet, Take 100 mg by mouth daily., Disp: , Rfl:    levothyroxine (SYNTHROID) 25 MCG tablet, TAKE 1 TABLET BY MOUTH DAILY BEFORE BREAKFAST., Disp: 90 tablet, Rfl: 0   modafinil (PROVIGIL) 200 MG tablet, TAKE ONE TABLET BY MOUTH DAILY, Disp: 30 tablet, Rfl: 0   Multiple Vitamin (MULTIVITAMIN WITH MINERALS) TABS tablet, Take 1 tablet by mouth daily., Disp: , Rfl:    nitrofurantoin, macrocrystal-monohydrate, (MACROBID) 100 MG capsule, Take 1 capsule (100 mg total) by mouth 2 (two) times daily., Disp: 10 capsule, Rfl: 0   ondansetron (ZOFRAN ODT) 4 MG disintegrating tablet, Take 1 tablet (4 mg total) by mouth every 8 (eight) hours as needed for nausea., Disp: 10 tablet, Rfl: 0   promethazine (PHENERGAN) 25 MG tablet, Take 1 tablet (25 mg total) by mouth every 6 (six) hours as needed for nausea or vomiting., Disp: 30 tablet, Rfl: 0   traZODone (DESYREL) 100 MG tablet, Take 1 tablet (100 mg total) by mouth at bedtime as needed for sleep., Disp: 180 tablet, Rfl: 1  atomoxetine (STRATTERA) 25 MG capsule, Take 1 capsule (25 mg total) by mouth daily., Disp: 30 capsule, Rfl: 1 Medication Side Effects: none  Family Medical/ Social History: Changes? Got a job at [employer], part time. Starts next week.  MENTAL HEALTH EXAM:  Blood pressure 105/77, pulse 79.There is no height or weight on file to calculate BMI.  General Appearance: Casual, Neat and Well Groomed  Eye Contact:  Good  Speech:  Clear and  Coherent  Volume:  Normal  Mood:  Euthymic  Affect:  Appropriate  Thought Process:  Goal Directed and Descriptions of Associations: Intact  Orientation:  Full (Time, Place, and Person)  Thought Content: Logical   Suicidal Thoughts:  No  Homicidal Thoughts:  No  Memory:  WNL  Judgement:  Good  Insight:  Good  Psychomotor Activity:  Normal  Concentration:  Concentration: Good and Attention Span: Good  Recall:  Good  Fund of Knowledge: Good  Language: Good  Assets:  Desire for Improvement  ADL's:  Intact  Cognition: WNL  Prognosis:  Good    DIAGNOSES:    ICD-10-CM   1. Episode of recurrent major depressive disorder, unspecified depression episode severity (HCC)  F33.9   2. PTSD (post-traumatic stress disorder)  F43.10   3. Generalized anxiety disorder  F41.1   4. Attention deficit hyperactivity disorder (ADHD), predominantly inattentive type  F90.0     Receiving Psychotherapy: No    RECOMMENDATIONS:  PDMP was reviewed. I spent 30 minutes with her. We discussed the ADHD.  We have discussed that in the past and I do believe she does have it so I can understand that starting a new job she needs to be on point.  We discussed all of her current medications which could possibly be causing decreased focus, namely Klonopin and gabapentin.  She has been on the Klonopin for years and feels that she still really needs that.  We may need to wean down in order to improve her ability to concentrate.  But for now I do want to make any changes since she does have a new job.  We discussed different medications that are appropriate with her other meds.  No stronger stimulants can be prescribed because she is on routine benzodiazepine.  She verbalizes understanding.  Were not really sure if the modafinil is working out well at this point or not but I will leave that the same for now, again because of her new job. That leaves Strattera, clonidine, and guanfacine as options.  We both agree that  Strattera would be the best choice for her right now.  Benefits, risks, side effects were discussed and she accepts.  Start Strattera 25 mg, 1 p.o. every morning. Continue Rexulti to 2 mg p.o. every morning. Continue Klonopin 1 mg 3 times daily as needed and one half midday as needed. Continue Prozac 40 mg, 2 p.o. daily. Continue modafinil 200 mg 1 p.o. every morning. Recommend counseling. Return in 4 weeks.  Donnal Moat, PA-C

## 2019-11-05 ENCOUNTER — Other Ambulatory Visit: Payer: Self-pay | Admitting: Physician Assistant

## 2019-11-21 ENCOUNTER — Other Ambulatory Visit: Payer: Self-pay

## 2019-11-21 ENCOUNTER — Ambulatory Visit (INDEPENDENT_AMBULATORY_CARE_PROVIDER_SITE_OTHER): Payer: 59

## 2019-11-21 ENCOUNTER — Other Ambulatory Visit: Payer: Self-pay | Admitting: Physician Assistant

## 2019-11-21 ENCOUNTER — Ambulatory Visit: Payer: 59 | Admitting: Mental Health

## 2019-11-21 DIAGNOSIS — R748 Abnormal levels of other serum enzymes: Secondary | ICD-10-CM

## 2019-11-21 NOTE — Telephone Encounter (Signed)
Next apt 07/02 Last refill 05/28

## 2019-11-21 NOTE — Telephone Encounter (Signed)
Has apt 07/02, 90 day request. Started 06/04

## 2019-11-22 LAB — ALKALINE PHOSPHATASE: Alkaline Phosphatase: 147 U/L — ABNORMAL HIGH (ref 39–117)

## 2019-11-22 LAB — PTH, INTACT AND CALCIUM
Calcium: 9.7 mg/dL (ref 8.6–10.2)
PTH: 31 pg/mL (ref 14–64)

## 2019-11-24 ENCOUNTER — Ambulatory Visit: Payer: 59

## 2019-11-25 ENCOUNTER — Ambulatory Visit: Payer: 59 | Admitting: Physician Assistant

## 2019-12-13 ENCOUNTER — Telehealth (INDEPENDENT_AMBULATORY_CARE_PROVIDER_SITE_OTHER): Payer: 59 | Admitting: Physician Assistant

## 2019-12-13 ENCOUNTER — Telehealth: Payer: Self-pay | Admitting: Physician Assistant

## 2019-12-13 ENCOUNTER — Encounter: Payer: Self-pay | Admitting: Physician Assistant

## 2019-12-13 DIAGNOSIS — F331 Major depressive disorder, recurrent, moderate: Secondary | ICD-10-CM | POA: Diagnosis not present

## 2019-12-13 DIAGNOSIS — F4310 Post-traumatic stress disorder, unspecified: Secondary | ICD-10-CM

## 2019-12-13 DIAGNOSIS — G8929 Other chronic pain: Secondary | ICD-10-CM

## 2019-12-13 DIAGNOSIS — F411 Generalized anxiety disorder: Secondary | ICD-10-CM

## 2019-12-13 DIAGNOSIS — F900 Attention-deficit hyperactivity disorder, predominantly inattentive type: Secondary | ICD-10-CM | POA: Diagnosis not present

## 2019-12-13 MED ORDER — ATOMOXETINE HCL 60 MG PO CAPS: 60.0000 mg | ORAL_CAPSULE | Freq: Every day | ORAL | 1 refills | Status: DC

## 2019-12-13 NOTE — Progress Notes (Signed)
[facility]  Patient ID: Valerie Evans,  MRN: [id number]  PCP: Waldon Merl, PA-C  Date of Evaluation: 12/13/2019 Time spent:20 minutes  Chief Complaint:  Chief Complaint    Follow-up     Virtual Visit via Telehealth  I connected with patient by a video enabled telemedicine application with their informed consent, and verified patient privacy and that I am speaking with the correct person using two identifiers.  I am private, in my office and the patient is in her car.  I discussed the limitations, risks, security and privacy concerns of performing an evaluation and management service by video and the availability of in person appointments. I also discussed with the patient that there may be a patient responsible charge related to this service. The patient expressed understanding and agreed to proceed.   I discussed the assessment and treatment plan with the patient. The patient was provided an opportunity to ask questions and all were answered. The patient agreed with the plan and demonstrated an understanding of the instructions.   The patient was advised to call back or seek an in-person evaluation if the symptoms worsen or if the condition fails to improve as anticipated.  I provided 20  minutes of non-face-to-face time during this encounter.   HISTORY/CURRENT STATUS: For routine med check.   At the last visit, we started Strattera in hopes to help her attention span.  States that has not really helped at all at the 25 mg.  She has started the new job at [employer], 3 days a week.  She is working in recovery right now and is having a hard time focusing.  Still feels depressed.  "I was hoping that going back to work would make me feel better but it really has not.  Is been over a month now and I think it would have helped if it was going to."  Still does not really enjoy things and energy and motivation are low.  That has not kept her from going to  work though.  Sleeps okay most of the time.  Personal hygiene is normal.  She is not crying easily.  No suicidal or homicidal thoughts.  She still has a lot of anxiety.  The Klonopin does help though.  If she does not take it, she feels jittery and panicky.  She does take it routinely for that reason.  Not having panic attacks.  Patient denies increased energy with decreased need for sleep, no increased talkativeness, no racing thoughts, no impulsivity or risky behaviors, no increased spending, no increased libido, no grandiosity, no increased irritability or anger, and no hallucinations.  Denies dizziness, syncope, seizures, numbness, tingling, tremor, tics, unsteady gait, slurred speech, confusion. Denies muscle or joint pain, stiffness, or dystonia.  Individual Medical History/ Review of Systems: Changes? :No    Past medications for mental health diagnoses include: Cymbalta, Zoloft, Wellbutrin, Xanax, trazodone, Lexapro, Elavil, Sonata Klonopin, Gabapentin, Rexulti, Abilify.   Allergies: Patient has no known allergies.  Current Medications:  Current Outpatient Medications:  .  albuterol (VENTOLIN HFA) 108 (90 Base) MCG/ACT inhaler, Inhale 1-2 puffs into the lungs every 6 (six) hours as needed for wheezing or shortness of breath., Disp: 6.7 g, Rfl: 1 .  brexpiprazole (REXULTI) 2 MG TABS tablet, Take 1 tablet (2 mg total) by mouth daily., Disp: 30 tablet, Rfl: 1 .  clonazePAM (KLONOPIN) 1 MG tablet, 1 po tid prn and may take 1/2 po mid-day on occas prn., Disp: 105 tablet,  Rfl: 1 .  FLUoxetine (PROZAC) 40 MG capsule, TAKE 2 CAPSULES BY MOUTH EVERY DAY, Disp: 180 capsule, Rfl: 0 .  gabapentin (NEURONTIN) 600 MG tablet, Take 1 tablet (600 mg total) by mouth 2 (two) times daily., Disp: 60 tablet, Rfl: 1 .  hydroxychloroquine (PLAQUENIL) 200 MG tablet, Take 100 mg by mouth daily., Disp: , Rfl:  .  levothyroxine (SYNTHROID) 25 MCG tablet, TAKE 1 TABLET BY MOUTH DAILY BEFORE BREAKFAST., Disp: 90  tablet, Rfl: 0 .  modafinil (PROVIGIL) 200 MG tablet, TAKE ONE TABLET BY MOUTH DAILY, Disp: 30 tablet, Rfl: 0 .  Multiple Vitamin (MULTIVITAMIN WITH MINERALS) TABS tablet, Take 1 tablet by mouth daily., Disp: , Rfl:  .  ondansetron (ZOFRAN ODT) 4 MG disintegrating tablet, Take 1 tablet (4 mg total) by mouth every 8 (eight) hours as needed for nausea., Disp: 10 tablet, Rfl: 0 .  promethazine (PHENERGAN) 25 MG tablet, Take 1 tablet (25 mg total) by mouth every 6 (six) hours as needed for nausea or vomiting., Disp: 30 tablet, Rfl: 0 .  traZODone (DESYREL) 100 MG tablet, Take 1 tablet (100 mg total) by mouth at bedtime as needed for sleep., Disp: 180 tablet, Rfl: 1 .  atomoxetine (STRATTERA) 60 MG capsule, Take 1 capsule (60 mg total) by mouth daily., Disp: 30 capsule, Rfl: 1 .  etodolac (LODINE) 200 MG capsule, Take 1 capsule (200 mg total) by mouth every 8 (eight) hours. (Patient not taking: Reported on 12/13/2019), Disp: 15 capsule, Rfl: 0 .  nitrofurantoin, macrocrystal-monohydrate, (MACROBID) 100 MG capsule, Take 1 capsule (100 mg total) by mouth 2 (two) times daily. (Patient not taking: Reported on 12/13/2019), Disp: 10 capsule, Rfl: 0 Medication Side Effects: none  Family Medical/ Social History: Changes?  See HPI  MENTAL HEALTH EXAM:  There were no vitals taken for this visit.There is no height or weight on file to calculate BMI.  General Appearance: Unable to assess  Eye Contact:  Unable to assess  Speech:  Clear and Coherent and Normal Rate  Volume:  Normal  Mood:  Euthymic  Affect:  Unable to assess  Thought Process:  Goal Directed and Descriptions of Associations: Intact  Orientation:  Full (Time, Place, and Person)  Thought Content: Logical   Suicidal Thoughts:  No  Homicidal Thoughts:  No  Memory:  WNL  Judgement:  Good  Insight:  Good  Psychomotor Activity:  Unable to assess  Concentration:  Concentration: Good and Attention Span: Fair  Recall:  Good  Fund of Knowledge: Good   Language: Good  Assets:  Desire for Improvement  ADL's:  Intact  Cognition: WNL  Prognosis:  Good    DIAGNOSES:    ICD-10-CM   1. PTSD (post-traumatic stress disorder)  F43.10   2. Generalized anxiety disorder  F41.1   3. Attention deficit hyperactivity disorder (ADHD), predominantly inattentive type  F90.0   4. Major depressive disorder, recurrent episode, moderate (HCC)  F33.1   5. Other chronic pain  G89.29     Receiving Psychotherapy: No    RECOMMENDATIONS:  PDMP was reviewed. I provided 20 minutes of nonface-to-face time during this encounter. We again discussed other options for treatment resistant depression including TMS, ECT, TCAs, MAOI.  I am hopeful that the Strattera, which can also have antidepressant effects, it is not just for ADD, will be beneficial at the right dose.  I recommend that we increase that and recheck in a month.  She still has 25 milligrams pills so she will double those and  when that supply is gone, she will start the new prescription Increase Strattera to 60 mg, 1 p.o. every morning. Continue Rexulti to 2 mg p.o. every morning. Continue Klonopin 1 mg 3 times daily as needed and one half midday as needed. Continue Prozac 40 mg, 2 p.o. daily. Continue gabapentin 600 mg 1 p.o. twice daily. Continue modafinil 200 mg 1 p.o. every morning. Has appt with Elio Forget, [facility] soon. Return in 4 weeks.  Melony Overly, PA-C

## 2019-12-13 NOTE — Telephone Encounter (Signed)
Ms. vernon, ariel are scheduled for a virtual visit with your provider today.    Just as we do with appointments in the office, we must obtain your consent to participate.  Your consent will be active for this visit and any virtual visit you may have with one of our providers in the next 365 days.    If you have a MyChart account, I can also send a copy of this consent to you electronically.  All virtual visits are billed to your insurance company just like a traditional visit in the office.  As this is a virtual visit, video technology does not allow for your provider to perform a traditional examination.  This may limit your provider's ability to fully assess your condition.  If your provider identifies any concerns that need to be evaluated in person or the need to arrange testing such as labs, EKG, etc, we will make arrangements to do so.    Although advances in technology are sophisticated, we cannot ensure that it will always work on either your end or our end.  If the connection with a video visit is poor, we may have to switch to a telephone visit.  With either a video or telephone visit, we are not always able to ensure that we have a secure connection.   I need to obtain your verbal consent now.   Are you willing to proceed with your visit today?   Valerie Evans has provided verbal consent on 12/13/2019 for a virtual visit (video or telephone).   Melony Overly, PA-C 12/13/2019  3:41 PM

## 2019-12-14 ENCOUNTER — Other Ambulatory Visit: Payer: Self-pay | Admitting: Physician Assistant

## 2019-12-23 ENCOUNTER — Other Ambulatory Visit: Payer: Self-pay

## 2019-12-23 ENCOUNTER — Other Ambulatory Visit (HOSPITAL_COMMUNITY)
Admission: RE | Admit: 2019-12-23 | Discharge: 2019-12-23 | Disposition: A | Discharge status: Home / Self Care | Payer: 59 | Source: Ambulatory Visit | Attending: Physician Assistant | Admitting: Physician Assistant

## 2019-12-23 ENCOUNTER — Ambulatory Visit: Payer: 59 | Admitting: Physician Assistant

## 2019-12-23 ENCOUNTER — Encounter: Payer: Self-pay | Admitting: Physician Assistant

## 2019-12-23 VITALS — BP 92/70 | HR 91 | Temp 98.0°F | Resp 14 | Ht 60.0 in | Wt 93.0 lb

## 2019-12-23 DIAGNOSIS — J4540 Moderate persistent asthma, uncomplicated: Secondary | ICD-10-CM | POA: Diagnosis not present

## 2019-12-23 DIAGNOSIS — R300 Dysuria: Secondary | ICD-10-CM

## 2019-12-23 LAB — POCT URINALYSIS DIPSTICK
Bilirubin, UA: NEGATIVE
Blood, UA: NEGATIVE
Glucose, UA: NEGATIVE
Ketones, UA: NEGATIVE
Leukocytes, UA: NEGATIVE
Nitrite, UA: NEGATIVE
Protein, UA: NEGATIVE
Spec Grav, UA: <=1.005 — AB (ref 1.010–1.025)
Urobilinogen, UA: 0.2 E.U./dL
pH, UA: 6.5 (ref 5.0–8.0)

## 2019-12-23 MED ORDER — CEPHALEXIN 500 MG PO CAPS: 500.0000 mg | ORAL_CAPSULE | Freq: Two times a day (BID) | ORAL | 0 refills | Status: AC

## 2019-12-23 MED ORDER — QVAR REDIHALER 80 MCG/ACT IN AERB: 1.0000 | INHALATION_SPRAY | Freq: Two times a day (BID) | RESPIRATORY_TRACT | 1 refills | Status: DC

## 2019-12-23 NOTE — Progress Notes (Signed)
Patient presents to clinic today to discuss multiple concerns.  Patient with history of UTI complains of several days of urinary urgency and frequency now with some foul-smelling urine and low back pain.  Denies fever chills.  Denies nausea or vomiting.  Denies flank pain.  Is sexually active with one female partner.  Denies concern for STI.  Has noted some mild vaginal discharge over the past couple of days.  Notes they did have intercourse Monday night prior to symptom onset.  Last menstrual period ended on 11/28/2019.  Patient also with history of intermittent asthma, now noting symptoms to be more persistent over the past couple of months.  Notes increased needed albuterol inhaler using at least daily with occasional nighttime awakenings.  Denies any chest congestion or significant cough.  Has history of seasonal allergies for which she takes occasional OTC antihistamine.  Denies any increase in allergy symptoms of late.   Past Medical History:  Diagnosis Date   Anxiety    Asthma    Chronic headache    Depression    History of chickenpox    Lupus (HCC)    Thyroid disease    UTI (lower urinary tract infection)     Current Outpatient Medications on File Prior to Visit  Medication Sig Dispense Refill   albuterol (VENTOLIN HFA) 108 (90 Base) MCG/ACT inhaler Inhale 1-2 puffs into the lungs every 6 (six) hours as needed for wheezing or shortness of breath. 6.7 g 1   atomoxetine (STRATTERA) 60 MG capsule Take 1 capsule (60 mg total) by mouth daily. 30 capsule 1   clonazePAM (KLONOPIN) 1 MG tablet 1 po tid prn and may take 1/2 po mid-day on occas prn. 105 tablet 1   FLUoxetine (PROZAC) 40 MG capsule TAKE 2 CAPSULES BY MOUTH EVERY DAY 180 capsule 0   gabapentin (NEURONTIN) 600 MG tablet Take 1 tablet (600 mg total) by mouth 2 (two) times daily. 60 tablet 1   hydroxychloroquine (PLAQUENIL) 200 MG tablet Take 100 mg by mouth daily.     levothyroxine (SYNTHROID) 25 MCG tablet TAKE  1 TABLET BY MOUTH DAILY BEFORE BREAKFAST. 90 tablet 0   modafinil (PROVIGIL) 200 MG tablet TAKE ONE TABLET BY MOUTH DAILY 30 tablet 0   Multiple Vitamin (MULTIVITAMIN WITH MINERALS) TABS tablet Take 1 tablet by mouth daily.     ondansetron (ZOFRAN ODT) 4 MG disintegrating tablet Take 1 tablet (4 mg total) by mouth every 8 (eight) hours as needed for nausea. 10 tablet 0   promethazine (PHENERGAN) 25 MG tablet Take 1 tablet (25 mg total) by mouth every 6 (six) hours as needed for nausea or vomiting. 30 tablet 0   REXULTI 2 MG TABS tablet TAKE 1 TABLET BY MOUTH EVERY DAY 30 tablet 1   traZODone (DESYREL) 100 MG tablet Take 1 tablet (100 mg total) by mouth at bedtime as needed for sleep. 180 tablet 1   No current facility-administered medications on file prior to visit.    No Known Allergies  Family History  Problem Relation Age of Onset   Heart disease Father    Depression Father    Heart attack Father    Hyperlipidemia Father    Hypertension Father    Hypertension Mother    Depression Mother    Cancer Maternal Aunt        Breast Cancer   Healthy Sister    Cancer Paternal Grandfather        Skin    Social History  Socioeconomic History   Marital status: Married    Spouse name: Not on file   Number of children: Not on file   Years of education: Not on file   Highest education level: Not on file  Occupational History   Not on file  Tobacco Use   Smoking status: Never Smoker   Smokeless tobacco: Never Used  Vaping Use   Vaping Use: Never used  Substance and Sexual Activity   Alcohol use: No    Comment: rarely   Drug use: No   Sexual activity: Yes  Other Topics Concern   Not on file  Social History Narrative   Not on file   Social Determinants of Health   Financial Resource Strain:    Difficulty of Paying Living Expenses:   Food Insecurity:    Worried About Programme researcher, broadcasting/film/video in the Last Year:    Barista in the Last Year:    Transportation Needs:    Freight forwarder (Medical):    Lack of Transportation (Non-Medical):   Physical Activity:    Days of Exercise per Week:    Minutes of Exercise per Session:   Stress:    Feeling of Stress :   Social Connections:    Frequency of Communication with Friends and Family:    Frequency of Social Gatherings with Friends and Family:    Attends Religious Services:    Active Member of Clubs or Organizations:    Attends Banker Meetings:    Marital Status:     Review of Systems - See HPI.  All other ROS are negative.  BP 92/70    Pulse 91    Temp 98 F (36.7 C) (Temporal)    Resp 14    Ht 5' (1.524 m)    Wt (!) 93 lb (42.2 kg)    SpO2 98%    BMI 18.16 kg/m   Physical Exam Vitals reviewed.  Constitutional:      Appearance: Normal appearance.  HENT:     Head: Normocephalic and atraumatic.     Right Ear: Tympanic membrane normal.     Left Ear: Tympanic membrane normal.     Mouth/Throat:     Mouth: Mucous membranes are moist.  Eyes:     Conjunctiva/sclera: Conjunctivae normal.     Pupils: Pupils are equal, round, and reactive to light.  Cardiovascular:     Rate and Rhythm: Normal rate and regular rhythm.     Pulses: Normal pulses.     Heart sounds: Normal heart sounds.  Pulmonary:     Breath sounds: Wheezing (Faint wheezing in lung bases bilaterally.  Otherwise lungs clear to auscultation.) present.  Abdominal:     General: Bowel sounds are normal. There is no distension.     Palpations: Abdomen is soft.     Tenderness: There is no abdominal tenderness. There is no right CVA tenderness or left CVA tenderness.  Musculoskeletal:     Cervical back: Neck supple.  Neurological:     Mental Status: She is alert.  Psychiatric:        Mood and Affect: Mood normal.     Recent Results (from the past 2160 hour(s))  POCT urinalysis dipstick     Status: Abnormal   Collection Time: 10/25/19  1:40 PM  Result Value Ref Range   Color,  UA orange     Comment: AZO   Clarity, UA cloudy    Glucose, UA Negative Negative   Bilirubin, UA +  Ketones, UA negative    Spec Grav, UA <=1.005 (A) 1.010 - 1.025   Blood, UA +    pH, UA 7.0 5.0 - 8.0   Protein, UA Negative Negative   Urobilinogen, UA 2.0 (A) 0.2 or 1.0 E.U./dL   Nitrite, UA +    Leukocytes, UA Large (3+) (A) Negative   Appearance     Odor    Urine Culture     Status: Abnormal   Collection Time: 10/25/19  1:53 PM   Specimen: Urine  Result Value Ref Range   MICRO NUMBER: [id number]    SPECIMEN QUALITY: Adequate    Sample Source URINE    STATUS: FINAL    ISOLATE 1: Escherichia coli (A)     Comment: Greater than 100,000 CFU/mL of Escherichia coli      Susceptibility   Escherichia coli - URINE CULTURE, REFLEX    AMOX/CLAVULANIC <=2 Sensitive     AMPICILLIN <=2 Sensitive     AMPICILLIN/SULBACTAM <=2 Sensitive     CEFAZOLIN* <=4 Not Reportable      * For infections other than uncomplicated UTIcaused by E. coli, K. pneumoniae or P. mirabilis:Cefazolin is resistant if MIC > or = 8 mcg/mL.(Distinguishing susceptible versus intermediatefor isolates with MIC < or = 4 mcg/mL requiresadditional testing.)For uncomplicated UTI caused by E. coli,K. pneumoniae or P. mirabilis: Cefazolin issusceptible if MIC <32 mcg/mL and predictssusceptible to the oral agents cefaclor, cefdinir,cefpodoxime, cefprozil, cefuroxime, cephalexinand loracarbef.    CEFEPIME <=1 Sensitive     CEFTRIAXONE <=1 Sensitive     CIPROFLOXACIN <=0.25 Sensitive     LEVOFLOXACIN <=0.12 Sensitive     ERTAPENEM <=0.5 Sensitive     GENTAMICIN <=1 Sensitive     IMIPENEM <=0.25 Sensitive     NITROFURANTOIN <=16 Sensitive     PIP/TAZO <=4 Sensitive     TOBRAMYCIN <=1 Sensitive     TRIMETH/SULFA* <=20 Sensitive      * For infections other than uncomplicated UTIcaused by E. coli, K. pneumoniae or P. mirabilis:Cefazolin is resistant if MIC > or = 8 mcg/mL.(Distinguishing susceptible versus intermediatefor isolates  with MIC < or = 4 mcg/mL requiresadditional testing.)For uncomplicated UTI caused by E. coli,K. pneumoniae or P. mirabilis: Cefazolin issusceptible if MIC <32 mcg/mL and predictssusceptible to the oral agents cefaclor, cefdinir,cefpodoxime, cefprozil, cefuroxime, cephalexinand loracarbef.Legend:S = Susceptible  I = IntermediateR = Resistant  NS = Not susceptible* = Not tested  NR = Not reported**NN = See antimicrobic comments  PTH, Intact and Calcium     Status: None   Collection Time: 11/21/19  1:47 PM  Result Value Ref Range   PTH 31 14 - 64 pg/mL    Comment: . Interpretive Guide    Intact PTH           Calcium ------------------    ----------           ------- Normal Parathyroid    Normal               Normal Hypoparathyroidism    Low or Low Normal    Low Hyperparathyroidism    Primary            Normal or High       High    Secondary          High                 Normal or Low    Tertiary           High  High Non-Parathyroid    Hypercalcemia      Low or Low Normal    High .    Calcium 9.7 8.6 - 10.2 mg/dL  Alkaline phosphatase     Status: Abnormal   Collection Time: 11/21/19  1:47 PM  Result Value Ref Range   Alkaline Phosphatase 147 (H) 39 - 117 U/L  POCT Urinalysis Dipstick     Status: Abnormal   Collection Time: 12/23/19 11:02 AM  Result Value Ref Range   Color, UA yellow    Clarity, UA clear    Glucose, UA Negative Negative   Bilirubin, UA Negative    Ketones, UA Negative    Spec Grav, UA <=1.005 (A) 1.010 - 1.025   Blood, UA Negative    pH, UA 6.5 5.0 - 8.0   Protein, UA Negative Negative   Urobilinogen, UA 0.2 0.2 or 1.0 E.U./dL   Nitrite, UA Negative    Leukocytes, UA Negative Negative   Appearance     Odor     Assessment/Plan: 1. Dysuria Inpatient with history of UTI.  Notes feels identical to previous infections but has noted scant amount of slight vaginal discharge.  Again denies concern for STI.  Urinalysis with elevated specific gravity.  Will  send for culture.  We will also obtain ancillary testing for yeast and BV.  Given we are heading into the weekend and she has classic symptoms for her UTIs, will start empiric treatment with Keflex 500 mg twice daily.  Will alter treatment regimen based on culture and ancillary testing results.  Increase fluids.  Rest.  OTC medications reviewed.  Strict ER precautions reviewed with patient.  Handout given. - POCT Urinalysis Dipstick - Urine Culture - Urine cytology ancillary only([facility]) - cephALEXin (KEFLEX) 500 MG capsule; Take 1 capsule (500 mg total) by mouth 2 (two) times daily for 7 days.  Dispense: 14 capsule; Refill: 0  2. Moderate persistent chronic asthma without complication Increase in symptomology recently.  Nighttime awakenings.  We will start her on Qvar 80 mcg 1 puff twice daily.  Continue albuterol and antihistamines as directed.  She is to let us know over the next couple weeks how symptoms are responding so we can make further adjustments. - beclomethasone (QVAR REDIHALER) 80 MCG/ACT inhaler; Inhale 1 puff into the lungs 2 (two) times daily.  Dispense: 10.6 g; Refill: 1  This visit occurred during the SARS-CoV-2 public health emergency.  Safety protocols were in place, including screening questions prior to the visit, additional usage of staff PPE, and extensive cleaning of exam room while observing appropriate contact time as indicated for disinfecting solutions.     Piedad Climes, PA-C

## 2019-12-23 NOTE — Patient Instructions (Signed)
Please start the Qvar as directed. Continue the albuterol as directed if needed for breakthrough symptoms. Let me know in a week how symptoms are going.   Your symptoms are consistent with a bladder infection, also called acute cystitis. Please take your antibiotic (Keflex) as directed until all pills are gone.  Stay very well hydrated.  Consider a daily probiotic (Align, Culturelle, or Activia) to help prevent stomach upset caused by the antibiotic.  Taking a probiotic daily may also help prevent recurrent UTIs.  Also consider taking AZO (Phenazopyridine) tablets to help decrease pain with urination.  I will call you with your urine testing results.  We will change antibiotics if indicated.  Call or return to clinic if symptoms are not resolved by completion of antibiotic.   Urinary Tract Infection A urinary tract infection (UTI) can occur any place along the urinary tract. The tract includes the kidneys, ureters, bladder, and urethra. A type of germ called bacteria often causes a UTI. UTIs are often helped with antibiotic medicine.  HOME CARE   If given, take antibiotics as told by your doctor. Finish them even if you start to feel better.  Drink enough fluids to keep your pee (urine) clear or pale yellow.  Avoid tea, drinks with caffeine, and bubbly (carbonated) drinks.  Pee often. Avoid holding your pee in for a long time.  Pee before and after having sex (intercourse).  Wipe from front to back after you poop (bowel movement) if you are a woman. Use each tissue only once. GET HELP RIGHT AWAY IF:   You have back pain.  You have lower belly (abdominal) pain.  You have chills.  You feel sick to your stomach (nauseous).  You throw up (vomit).  Your burning or discomfort with peeing does not go away.  You have a fever.  Your symptoms are not better in 3 days. MAKE SURE YOU:   Understand these instructions.  Will watch your condition.  Will get help right away if you are not  doing well or get worse. Document Released: 10/29/2007 Document Revised: 02/04/2012 Document Reviewed: 12/11/2011 [facility] Patient Information 2015 [place], [place]. This information is not intended to replace advice given to you by your health care provider. Make sure you discuss any questions you have with your health care provider.

## 2019-12-25 ENCOUNTER — Other Ambulatory Visit: Payer: Self-pay | Admitting: Physician Assistant

## 2019-12-25 LAB — URINE CULTURE
MICRO NUMBER:: 10770455
SPECIMEN QUALITY:: ADEQUATE

## 2019-12-27 ENCOUNTER — Ambulatory Visit: Payer: 59 | Admitting: Mental Health

## 2019-12-27 LAB — URINE CYTOLOGY ANCILLARY ONLY
Bacterial Vaginitis-Urine: NEGATIVE
Candida Urine: NEGATIVE

## 2019-12-29 NOTE — Telephone Encounter (Signed)
Next apt 08/26

## 2020-01-05 ENCOUNTER — Other Ambulatory Visit: Payer: Self-pay | Admitting: Physician Assistant

## 2020-01-12 ENCOUNTER — Other Ambulatory Visit: Payer: Self-pay | Admitting: Physician Assistant

## 2020-01-19 ENCOUNTER — Ambulatory Visit (INDEPENDENT_AMBULATORY_CARE_PROVIDER_SITE_OTHER): Payer: 59 | Admitting: Physician Assistant

## 2020-01-19 ENCOUNTER — Encounter: Payer: Self-pay | Admitting: Physician Assistant

## 2020-01-19 ENCOUNTER — Other Ambulatory Visit: Payer: Self-pay

## 2020-01-19 DIAGNOSIS — F331 Major depressive disorder, recurrent, moderate: Secondary | ICD-10-CM

## 2020-01-19 DIAGNOSIS — F411 Generalized anxiety disorder: Secondary | ICD-10-CM | POA: Diagnosis not present

## 2020-01-19 DIAGNOSIS — F900 Attention-deficit hyperactivity disorder, predominantly inattentive type: Secondary | ICD-10-CM

## 2020-01-19 DIAGNOSIS — F4310 Post-traumatic stress disorder, unspecified: Secondary | ICD-10-CM | POA: Diagnosis not present

## 2020-01-19 MED ORDER — BUPROPION HCL ER (XL) 150 MG PO TB24: 150.0000 mg | ORAL_TABLET | Freq: Every day | ORAL | 1 refills | Status: DC

## 2020-01-19 MED ORDER — ATOMOXETINE HCL 80 MG PO CAPS
80.0000 mg | ORAL_CAPSULE | Freq: Every day | ORAL | 1 refills | Status: DC
Start: 2020-01-19 — End: 2020-02-27

## 2020-01-19 MED ORDER — BREXPIPRAZOLE 2 MG PO TABS: 2.0000 mg | ORAL_TABLET | Freq: Every day | ORAL | 1 refills | Status: DC

## 2020-01-19 NOTE — Patient Instructions (Signed)
On the Prozac 40 mg, take 1 pill daily for 10 days and then stop it.

## 2020-01-19 NOTE — Progress Notes (Signed)
[facility]  Patient ID: Valerie Evans,  MRN: [id number]  PCP: Noel Journey  Date of Evaluation: 01/19/2020 Time spent:30 minutes  Chief Complaint:  Chief Complaint    Anxiety; Insomnia; ADD; Depression; Stress      HISTORY/CURRENT STATUS: For routine med check.   Reports mood swings, a few times a week, ranging from crying to being angry, can last anywhere from a few mins to a few hours. "I'm miserable."Wonders if retrying Wellbutrin would be a good idea.  Feels that Prozac isn't helping at all.   Still has a hard time focusing and getting things done. Not sure how much the Strattera is helping.   Still feels depressed. Is depressed more of the time than not. Still does not really enjoy things and energy and motivation are lacking. That has not kept her from going to work though.  No trouble going to sleep but does have trouble staying asleep sometimes. No nightmares lately.  Personal hygiene is normal.  She is not crying easily.  No suicidal or homicidal thoughts.  She still has a lot of anxiety.  The Klonopin does help though.  If she does not take it, she feels jittery and panicky.  She does take it routinely for that reason.  Not having panic attacks.  Patient denies increased energy with decreased need for sleep, no increased talkativeness, no racing thoughts, no impulsivity or risky behaviors, no increased spending, no increased libido, no grandiosity, no increased irritability or anger, and no hallucinations.  Denies dizziness, syncope, seizures, numbness, tingling, tremor, tics, unsteady gait, slurred speech, confusion. Denies muscle or joint pain, stiffness, or dystonia.  Individual Medical History/ Review of Systems: Changes? :No    Past medications for mental health diagnoses include: Cymbalta, Zoloft, Wellbutrin, Xanax, trazodone, Lexapro, Elavil, Sonata Klonopin, Gabapentin, Prozac, Rexulti, Abilify.   Allergies: Patient has no known  allergies.  Current Medications:  Current Outpatient Medications:  .  albuterol (VENTOLIN HFA) 108 (90 Base) MCG/ACT inhaler, Inhale 1-2 puffs into the lungs every 6 (six) hours as needed for wheezing or shortness of breath., Disp: 6.7 g, Rfl: 1 .  beclomethasone (QVAR REDIHALER) 80 MCG/ACT inhaler, Inhale 1 puff into the lungs 2 (two) times daily., Disp: 10.6 g, Rfl: 1 .  brexpiprazole (REXULTI) 2 MG TABS tablet, Take 1 tablet (2 mg total) by mouth daily., Disp: 30 tablet, Rfl: 1 .  clonazePAM (KLONOPIN) 1 MG tablet, 1 po tid prn and may take 1/2 po mid-day on occas prn., Disp: 105 tablet, Rfl: 1 .  gabapentin (NEURONTIN) 600 MG tablet, Take 1 tablet (600 mg total) by mouth 2 (two) times daily., Disp: 60 tablet, Rfl: 1 .  hydroxychloroquine (PLAQUENIL) 200 MG tablet, Take 100 mg by mouth daily., Disp: , Rfl:  .  levothyroxine (SYNTHROID) 25 MCG tablet, TAKE 1 TABLET BY MOUTH DAILY BEFORE BREAKFAST., Disp: 90 tablet, Rfl: 0 .  modafinil (PROVIGIL) 200 MG tablet, TAKE ONE TABLET BY MOUTH DAILY, Disp: 30 tablet, Rfl: 5 .  Multiple Vitamin (MULTIVITAMIN WITH MINERALS) TABS tablet, Take 1 tablet by mouth daily., Disp: , Rfl:  .  ondansetron (ZOFRAN ODT) 4 MG disintegrating tablet, Take 1 tablet (4 mg total) by mouth every 8 (eight) hours as needed for nausea., Disp: 10 tablet, Rfl: 0 .  promethazine (PHENERGAN) 25 MG tablet, Take 1 tablet (25 mg total) by mouth every 6 (six) hours as needed for nausea or vomiting., Disp: 30 tablet, Rfl: 0 .  traZODone (DESYREL) 100 MG tablet,  Take 1 tablet (100 mg total) by mouth at bedtime as needed for sleep., Disp: 180 tablet, Rfl: 1 .  atomoxetine (STRATTERA) 80 MG capsule, Take 1 capsule (80 mg total) by mouth daily., Disp: 30 capsule, Rfl: 1 .  buPROPion (WELLBUTRIN XL) 150 MG 24 hr tablet, Take 1 tablet (150 mg total) by mouth daily., Disp: 30 tablet, Rfl: 1 Medication Side Effects: none  Family Medical/ Social History: Changes?  Is working part time at [employer].  MENTAL HEALTH EXAM:  There were no vitals taken for this visit.There is no height or weight on file to calculate BMI.  General Appearance: Casual, Neat and Well Groomed  Eye Contact:  Good  Speech:  Clear and Coherent and Normal Rate  Volume:  Normal  Mood:  Depressed  Affect:  Appropriate  Thought Process:  Goal Directed and Descriptions of Associations: Intact  Orientation:  Full (Time, Place, and Person)  Thought Content: Logical   Suicidal Thoughts:  No  Homicidal Thoughts:  No  Memory:  WNL  Judgement:  Good  Insight:  Good  Psychomotor Activity:  Normal  Concentration:  Concentration: Good and Attention Span: Fair  Recall:  Good  Fund of Knowledge: Good  Language: Good  Assets:  Desire for Improvement  ADL's:  Intact  Cognition: WNL  Prognosis:  Good    DIAGNOSES:    ICD-10-CM   1. Major depressive disorder, recurrent episode, moderate (HCC)  F33.1   2. Attention deficit hyperactivity disorder (ADHD), predominantly inattentive type  F90.0   3. Generalized anxiety disorder  F41.1   4. PTSD (post-traumatic stress disorder)  F43.10     Receiving Psychotherapy: No    RECOMMENDATIONS:  PDMP was reviewed. I provided 30 minutes of nonface-to-face time during this encounter. We discussed options. I recommend increase Strattera for the ADD.  Wean off Prozac quickly, as there's no reason to wean, but b/c she's on a high dose. And recommend res-starting Wellbutrin b/c it did help in the past.  Increase Strattera to 80 mg q am. Re-start Wellbutrin XL 150 mg q am. Continue Rexulti to 2 mg p.o. every morning. Continue Klonopin 1 mg 3 times daily as needed and one half midday as needed. Wean off Prozac 40 mg by taking 1 po qd for 10 days, then stop. Continue gabapentin 600 mg 1 p.o. twice daily. Continue modafinil 200 mg 1 p.o. every morning. Return in 6 weeks.  Melony Overly, PA-C

## 2020-01-22 ENCOUNTER — Other Ambulatory Visit: Payer: Self-pay | Admitting: Physician Assistant

## 2020-01-22 NOTE — Telephone Encounter (Signed)
Next apt 02/27/20 

## 2020-01-23 ENCOUNTER — Encounter: Payer: Self-pay | Admitting: Physician Assistant

## 2020-01-25 ENCOUNTER — Ambulatory Visit: Payer: 59 | Admitting: Physician Assistant

## 2020-01-25 ENCOUNTER — Other Ambulatory Visit: Payer: Self-pay

## 2020-01-25 ENCOUNTER — Encounter: Payer: Self-pay | Admitting: Physician Assistant

## 2020-01-25 VITALS — BP 98/70 | HR 106 | Temp 98.5°F | Resp 14 | Ht 60.0 in | Wt 93.0 lb

## 2020-01-25 DIAGNOSIS — J302 Other seasonal allergic rhinitis: Secondary | ICD-10-CM | POA: Diagnosis not present

## 2020-01-25 DIAGNOSIS — N390 Urinary tract infection, site not specified: Secondary | ICD-10-CM | POA: Diagnosis not present

## 2020-01-25 LAB — POCT URINALYSIS DIPSTICK
Bilirubin, UA: NEGATIVE
Blood, UA: NEGATIVE
Glucose, UA: NEGATIVE
Ketones, UA: NEGATIVE
Nitrite, UA: NEGATIVE
Protein, UA: NEGATIVE
Spec Grav, UA: 1.01 (ref 1.010–1.025)
Urobilinogen, UA: 0.2 E.U./dL
pH, UA: 7.5 (ref 5.0–8.0)

## 2020-01-25 MED ORDER — SULFAMETHOXAZOLE-TRIMETHOPRIM 800-160 MG PO TABS: 1.0000 | ORAL_TABLET | Freq: Two times a day (BID) | ORAL | 0 refills | Status: DC

## 2020-01-25 NOTE — Progress Notes (Signed)
Patient presents to clinic today c/o 3 days of urinary urgency, frequency, mild dysuria and to episodes of urinary incontinence due to significant urgency.  Denies fever, chills, abdominal pain, flank pain, nausea, vomiting or hematuria.  Has history of UTI, most recent infection was a month ago.  Denies any change to feminine hygiene products.  Denies any association with sexual activity.  Denies any vaginal pain, pruritus discharge.  Denies concern for STI.  Was last treated with cephalosporin for E. coli UTI.  Patient is also noted mild nasal stuffiness and postnasal drainage starting this morning.  Does have a history of some environmental allergies but has not been an issue in some time.  Denies fever, chills, sinus pain, ear pain or tooth pain.  Denies any chest congestion or cough.  Has not taken anything for symptoms.  Past Medical History:  Diagnosis Date  . Anxiety   . Asthma   . Chronic headache   . Depression   . History of chickenpox   . Lupus (HCC)   . Thyroid disease   . UTI (lower urinary tract infection)     Current Outpatient Medications on File Prior to Visit  Medication Sig Dispense Refill  . albuterol (VENTOLIN HFA) 108 (90 Base) MCG/ACT inhaler Inhale 1-2 puffs into the lungs every 6 (six) hours as needed for wheezing or shortness of breath. 6.7 g 1  . atomoxetine (STRATTERA) 80 MG capsule Take 1 capsule (80 mg total) by mouth daily. 30 capsule 1  . beclomethasone (QVAR REDIHALER) 80 MCG/ACT inhaler Inhale 1 puff into the lungs 2 (two) times daily. 10.6 g 1  . brexpiprazole (REXULTI) 2 MG TABS tablet Take 1 tablet (2 mg total) by mouth daily. 30 tablet 1  . buPROPion (WELLBUTRIN XL) 150 MG 24 hr tablet Take 1 tablet (150 mg total) by mouth daily. 30 tablet 1  . clonazePAM (KLONOPIN) 1 MG tablet TAKE 1 TABLET 3 TIMES A DAY AND MAY TAKE 1/2 TABLET MID-DAY ON OCCASION AS NEEDED 105 tablet 1  . gabapentin (NEURONTIN) 600 MG tablet Take 1 tablet (600 mg total) by mouth 2  (two) times daily. 60 tablet 1  . hydroxychloroquine (PLAQUENIL) 200 MG tablet Take 100 mg by mouth daily.    Marland Kitchen levothyroxine (SYNTHROID) 25 MCG tablet TAKE 1 TABLET BY MOUTH DAILY BEFORE BREAKFAST. 90 tablet 0  . modafinil (PROVIGIL) 200 MG tablet TAKE ONE TABLET BY MOUTH DAILY 30 tablet 5  . Multiple Vitamin (MULTIVITAMIN WITH MINERALS) TABS tablet Take 1 tablet by mouth daily.    . traZODone (DESYREL) 100 MG tablet Take 1 tablet (100 mg total) by mouth at bedtime as needed for sleep. 180 tablet 1   No current facility-administered medications on file prior to visit.    No Known Allergies  Family History  Problem Relation Age of Onset  . Heart disease Father   . Depression Father   . Heart attack Father   . Hyperlipidemia Father   . Hypertension Father   . Hypertension Mother   . Depression Mother   . Cancer Maternal Aunt        Breast Cancer  . Healthy Sister   . Cancer Paternal Grandfather        Skin    Social History   Socioeconomic History  . Marital status: Married    Spouse name: Not on file  . Number of children: Not on file  . Years of education: Not on file  . Highest education level: Not on  file  Occupational History  . Not on file  Tobacco Use  . Smoking status: Never Smoker  . Smokeless tobacco: Never Used  Vaping Use  . Vaping Use: Never used  Substance and Sexual Activity  . Alcohol use: No    Comment: rarely  . Drug use: No  . Sexual activity: Yes  Other Topics Concern  . Not on file  Social History Narrative  . Not on file   Social Determinants of Health   Financial Resource Strain:   . Difficulty of Paying Living Expenses: Not on file  Food Insecurity:   . Worried About Programme researcher, broadcasting/film/videounning Out of Food in the Last Year: Not on file  . Ran Out of Food in the Last Year: Not on file  Transportation Needs:   . Lack of Transportation (Medical): Not on file  . Lack of Transportation (Non-Medical): Not on file  Physical Activity:   . Days of Exercise  per Week: Not on file  . Minutes of Exercise per Session: Not on file  Stress:   . Feeling of Stress : Not on file  Social Connections:   . Frequency of Communication with Friends and Family: Not on file  . Frequency of Social Gatherings with Friends and Family: Not on file  . Attends Religious Services: Not on file  . Active Member of Clubs or Organizations: Not on file  . Attends BankerClub or Organization Meetings: Not on file  . Marital Status: Not on file   Review of Systems - See HPI.  All other ROS are negative.  BP 98/70   Pulse (!) 106   Temp 98.5 F (36.9 C) (Temporal)   Resp 14   Ht 5' (1.524 m)   Wt 93 lb (42.2 kg)   BMI 18.16 kg/m   Physical Exam Vitals reviewed.  Constitutional:      Appearance: Normal appearance.  HENT:     Head: Normocephalic and atraumatic.     Right Ear: A middle ear effusion (Scant serous fluid) is present.     Nose: Rhinorrhea present.     Mouth/Throat:     Mouth: Mucous membranes are moist.  Eyes:     Pupils: Pupils are equal, round, and reactive to light.  Cardiovascular:     Rate and Rhythm: Normal rate and regular rhythm.     Pulses: Normal pulses.     Heart sounds: Normal heart sounds.  Pulmonary:     Effort: Pulmonary effort is normal.     Breath sounds: Normal breath sounds.  Abdominal:     General: Bowel sounds are normal. There is no distension.     Palpations: Abdomen is soft.     Tenderness: There is no abdominal tenderness. There is no right CVA tenderness or left CVA tenderness.  Musculoskeletal:     Cervical back: Neck supple.  Neurological:     Mental Status: She is alert.     Recent Results (from the past 2160 hour(s))  PTH, Intact and Calcium     Status: None   Collection Time: 11/21/19  1:47 PM  Result Value Ref Range   PTH 31 14 - 64 pg/mL    Comment: . Interpretive Guide    Intact PTH           Calcium ------------------    ----------           ------- Normal Parathyroid    Normal  Normal Hypoparathyroidism    Low or Low Normal    Low Hyperparathyroidism    Primary            Normal or High       High    Secondary          High                 Normal or Low    Tertiary           High                 High Non-Parathyroid    Hypercalcemia      Low or Low Normal    High .    Calcium 9.7 8.6 - 10.2 mg/dL  Alkaline phosphatase     Status: Abnormal   Collection Time: 11/21/19  1:47 PM  Result Value Ref Range   Alkaline Phosphatase 147 (H) 39 - 117 U/L  POCT Urinalysis Dipstick     Status: Abnormal   Collection Time: 12/23/19 11:02 AM  Result Value Ref Range   Color, UA yellow    Clarity, UA clear    Glucose, UA Negative Negative   Bilirubin, UA Negative    Ketones, UA Negative    Spec Grav, UA <=1.005 (A) 1.010 - 1.025   Blood, UA Negative    pH, UA 6.5 5.0 - 8.0   Protein, UA Negative Negative   Urobilinogen, UA 0.2 0.2 or 1.0 E.U./dL   Nitrite, UA Negative    Leukocytes, UA Negative Negative   Appearance     Odor    Urine Culture     Status: Abnormal   Collection Time: 12/23/19 11:20 AM   Specimen: Urine  Result Value Ref Range   MICRO NUMBER: [id number]    SPECIMEN QUALITY: Adequate    Sample Source NOT GIVEN    STATUS: FINAL    ISOLATE 1: Escherichia coli (A)     Comment: Greater than 100,000 CFU/mL of Escherichia coli      Susceptibility   Escherichia coli - URINE CULTURE, REFLEX    AMOX/CLAVULANIC <=2 Sensitive     AMPICILLIN <=2 Sensitive     AMPICILLIN/SULBACTAM <=2 Sensitive     CEFAZOLIN* <=4 Not Reportable      * For infections other than uncomplicated UTIcaused by E. coli, K. pneumoniae or P. mirabilis:Cefazolin is resistant if MIC > or = 8 mcg/mL.(Distinguishing susceptible versus intermediatefor isolates with MIC < or = 4 mcg/mL requiresadditional testing.)For uncomplicated UTI caused by E. coli,K. pneumoniae or P. mirabilis: Cefazolin issusceptible if MIC <32 mcg/mL and predictssusceptible to the oral agents cefaclor, cefdinir,cefpodoxime,  cefprozil, cefuroxime, cephalexinand loracarbef.    CEFEPIME <=1 Sensitive     CEFTRIAXONE <=1 Sensitive     CIPROFLOXACIN <=0.25 Sensitive     LEVOFLOXACIN <=0.12 Sensitive     ERTAPENEM <=0.5 Sensitive     GENTAMICIN <=1 Sensitive     IMIPENEM <=0.25 Sensitive     NITROFURANTOIN <=16 Sensitive     PIP/TAZO <=4 Sensitive     TOBRAMYCIN <=1 Sensitive     TRIMETH/SULFA* <=20 Sensitive      * For infections other than uncomplicated UTIcaused by E. coli, K. pneumoniae or P. mirabilis:Cefazolin is resistant if MIC > or = 8 mcg/mL.(Distinguishing susceptible versus intermediatefor isolates with MIC < or = 4 mcg/mL requiresadditional testing.)For uncomplicated UTI caused by E. coli,K. pneumoniae or P. mirabilis: Cefazolin issusceptible if MIC <32 mcg/mL and predictssusceptible to the oral agents cefaclor, cefdinir,cefpodoxime,  cefprozil, cefuroxime, cephalexinand loracarbef.Legend:S = Susceptible  I = IntermediateR = Resistant  NS = Not susceptible* = Not tested  NR = Not reported**NN = See antimicrobic comments  Urine cytology ancillary only([place])     Status: None   Collection Time: 12/23/19 11:28 AM  Result Value Ref Range   Bacterial Vaginitis-Urine Negative    Candida Urine Negative    Molecular Comment      This specimen does not meet the strict criteria set by the FDA. The   Molecular Comment      result interpretation should be considered in conjunction with the   Molecular Comment patient's clinical history.   POCT Urinalysis Dipstick     Status: Abnormal   Collection Time: 01/25/20 11:36 AM  Result Value Ref Range   Color, UA yellow/orange    Clarity, UA hazy    Glucose, UA Negative Negative   Bilirubin, UA Negative    Ketones, UA Negative    Spec Grav, UA 1.010 1.010 - 1.025   Blood, UA Negative    pH, UA 7.5 5.0 - 8.0   Protein, UA Negative Negative   Urobilinogen, UA 0.2 0.2 or 1.0 E.U./dL   Nitrite, UA Negative    Leukocytes, UA Trace (A) Negative   Appearance      Odor      Assessment/Plan: 1. Frequent UTI Classic symptoms for her.  Urine dip positive for leukocyte esterase.  Otherwise unremarkable.  Is on Azo and this may affect rapid test.  Urine culture sent.  Giving frequency of UTIs, referral to urology was placed.  Will start empiric treatment for cystitis with Bactrim DS.  Supportive measures and OTC medications reviewed.  Will alter treatment regimen based on culture results and response to empiric treatment. - POCT Urinalysis Dipstick - Urine Culture - Ambulatory referral to Urology - sulfamethoxazole-trimethoprim (BACTRIM DS) 800-160 MG tablet; Take 1 tablet by mouth 2 (two) times daily.  Dispense: 10 tablet; Refill: 0  2. Seasonal allergic rhinitis, unspecified trigger Patient to start daily antihistamine and OTC nasal steroid.  Saline nasal rinses also recommended.  Return precautions discussed with patient.  This visit occurred during the SARS-CoV-2 public health emergency.  Safety protocols were in place, including screening questions prior to the visit, additional usage of staff PPE, and extensive cleaning of exam room while observing appropriate contact time as indicated for disinfecting solutions.    Piedad Climes, PA-C

## 2020-01-25 NOTE — Patient Instructions (Signed)
Please start daily antihistamine -- Claritin or Xyzal. Start OTC Nasacort spray daily.  Let me know if symptoms are not calming down.  If you note any new onset fever, chest congestion, cough, etc you need to schedule a video follow-up with me or be seen at nearest urgent care for COVID testing.    Your symptoms are consistent with a bladder infection, also called acute cystitis. Please take your antibiotic (Bactrim) as directed until all pills are gone.  Stay very well hydrated.  Consider a daily probiotic (Align, Culturelle, or Activia) to help prevent stomach upset caused by the antibiotic.  Taking a probiotic daily may also help prevent recurrent UTIs.  Also consider taking AZO (Phenazopyridine) tablets to help decrease pain with urination.  I will call you with your urine testing results.  We will change antibiotics if indicated.  Call or return to clinic if symptoms are not resolved by completion of antibiotic.  I am setting you up with Urology for further evaluation of this recurring infections   Urinary Tract Infection A urinary tract infection (UTI) can occur any place along the urinary tract. The tract includes the kidneys, ureters, bladder, and urethra. A type of germ called bacteria often causes a UTI. UTIs are often helped with antibiotic medicine.  HOME CARE   If given, take antibiotics as told by your doctor. Finish them even if you start to feel better.  Drink enough fluids to keep your pee (urine) clear or pale yellow.  Avoid tea, drinks with caffeine, and bubbly (carbonated) drinks.  Pee often. Avoid holding your pee in for a long time.  Pee before and after having sex (intercourse).  Wipe from front to back after you poop (bowel movement) if you are a woman. Use each tissue only once. GET HELP RIGHT AWAY IF:   You have back pain.  You have lower belly (abdominal) pain.  You have chills.  You feel sick to your stomach (nauseous).  You throw up (vomit).  Your  burning or discomfort with peeing does not go away.  You have a fever.  Your symptoms are not better in 3 days. MAKE SURE YOU:   Understand these instructions.  Will watch your condition.  Will get help right away if you are not doing well or get worse. Document Released: 10/29/2007 Document Revised: 02/04/2012 Document Reviewed: 12/11/2011 [employer] Patient Information 2015 [place], [place]. This information is not intended to replace advice given to you by your health care provider. Make sure you discuss any questions you have with your health care provider.

## 2020-01-26 LAB — URINE CULTURE
MICRO NUMBER:: 10899506
SPECIMEN QUALITY:: ADEQUATE

## 2020-01-30 ENCOUNTER — Encounter: Payer: Self-pay | Admitting: Physician Assistant

## 2020-01-31 ENCOUNTER — Telehealth: Payer: Self-pay | Admitting: Physician Assistant

## 2020-01-31 NOTE — Telephone Encounter (Signed)
Pt called in stating that she took a rapid test on Sunday and she is covid positive. She states she was told by her rheumatologist that since she has lupus to contact her pcp to set up covid antibody treatment. Please advise if Valerie Evans is will set this up.  Pt is aware he is not in the office today.

## 2020-01-31 NOTE — Telephone Encounter (Signed)
Please advise 

## 2020-02-01 ENCOUNTER — Telehealth: Payer: Self-pay

## 2020-02-01 NOTE — Telephone Encounter (Signed)
Patient states she was told to call back if she has not hear anything regarding referral for covid antibody treatment. Patient checking status. Please advise.

## 2020-02-01 NOTE — Telephone Encounter (Signed)
Pt called back checking on this, please advise  °

## 2020-02-01 NOTE — Telephone Encounter (Signed)
Referral was sent today. They are working on getting to her ASAP. Should hear something by tomorrow if not later this evening.  The number for infusion center is [phone] for her to call them if she has not heard from them by lunch tomorrow.

## 2020-02-01 NOTE — Telephone Encounter (Signed)
I have already called infusion center and giving them patient's information to set up infusion. Attempted to call patient back to discuss but no answer. LMOVM regarding infusions and instructions to call back if any further questions.

## 2020-02-03 ENCOUNTER — Encounter: Payer: Self-pay | Admitting: Oncology

## 2020-02-03 ENCOUNTER — Other Ambulatory Visit: Payer: Self-pay | Admitting: Oncology

## 2020-02-03 ENCOUNTER — Telehealth: Payer: Self-pay | Admitting: Oncology

## 2020-02-03 DIAGNOSIS — U071 COVID-19: Secondary | ICD-10-CM

## 2020-02-03 NOTE — Telephone Encounter (Signed)
Pt was made aware of the infusion centers # and advised to reach out to them.

## 2020-02-03 NOTE — Telephone Encounter (Signed)
I connected by phone with  Valerie Evans to discuss the potential use of an new treatment for mild to moderate COVID-19 viral infection in non-hospitalized patients.   This patient is a age/sex that meets the FDA criteria for Emergency Use Authorization of casirivimab\imdevimab.  Has a (+) direct SARS-CoV-2 viral test result 1. Has mild or moderate COVID-19  2. Is ? 33 years of age and weighs ? 40 kg 3. Is NOT hospitalized due to COVID-19 4. Is NOT requiring oxygen therapy or requiring an increase in baseline oxygen flow rate due to COVID-19 5. Is within 10 days of symptom onset 6. Has at least one of the high risk factor(s) for progression to severe COVID-19 and/or hospitalization as defined in EUA. ? Specific high risk criteria-  Past Medical History:  Diagnosis Date  . Anxiety   . Asthma   . Chronic headache   . Depression   . History of chickenpox   . Lupus (HCC)   . Thyroid disease   . UTI (lower urinary tract infection)   ?  ?    Symptom onset- 01/27/2020   I have spoken and communicated the following to the patient or parent/caregiver:   1. FDA has authorized the emergency use of casirivimab\imdevimab for the treatment of mild to moderate COVID-19 in adults and pediatric patients with positive results of direct SARS-CoV-2 viral testing who are 62 years of age and older weighing at least 40 kg, and who are at high risk for progressing to severe COVID-19 and/or hospitalization.   2. The significant known and potential risks and benefits of casirivimab\imdevimab, and the extent to which such potential risks and benefits are unknown.   3. Information on available alternative treatments and the risks and benefits of those alternatives, including clinical trials.   4. Patients treated with casirivimab\imdevimab should continue to self-isolate and use infection control measures (e.g., wear mask, isolate, social distance, avoid sharing personal items, clean and disinfect "high touch"  surfaces, and frequent handwashing) according to CDC guidelines.    5. The patient or parent/caregiver has the option to accept or refuse casirivimab\imdevimab .   After reviewing this information with the patient, The patient agreed to proceed with receiving casirivimab\imdevimab infusion and will be provided a copy of the Fact sheet prior to receiving the infusion.Mignon Pine, AGNP-C [phone] (Infusion Center Hotline)

## 2020-02-04 ENCOUNTER — Ambulatory Visit (HOSPITAL_COMMUNITY)
Admission: RE | Admit: 2020-02-04 | Discharge: 2020-02-04 | Disposition: A | Discharge status: Home / Self Care | Payer: 59 | Source: Ambulatory Visit | Attending: Pulmonary Disease | Admitting: Pulmonary Disease

## 2020-02-04 ENCOUNTER — Other Ambulatory Visit (HOSPITAL_COMMUNITY): Payer: Self-pay

## 2020-02-04 DIAGNOSIS — U071 COVID-19: Secondary | ICD-10-CM | POA: Diagnosis not present

## 2020-02-04 MED ORDER — FAMOTIDINE IN NACL 20-0.9 MG/50ML-% IV SOLN: 20.0000 mg | Freq: Once | INTRAVENOUS | Status: DC | PRN

## 2020-02-04 MED ORDER — METHYLPREDNISOLONE SODIUM SUCC 125 MG IJ SOLR: 125.0000 mg | Freq: Once | INTRAMUSCULAR | Status: DC | PRN

## 2020-02-04 MED ORDER — DIPHENHYDRAMINE HCL 50 MG/ML IJ SOLN: 50.0000 mg | Freq: Once | INTRAMUSCULAR | Status: DC | PRN

## 2020-02-04 MED ORDER — SODIUM CHLORIDE 0.9 % IV SOLN
1200.0000 mg | Freq: Once | INTRAVENOUS | Status: AC
  Administered 2020-02-04: 1200 mg via INTRAVENOUS
  Filled 2020-02-04: qty 10

## 2020-02-04 MED ORDER — EPINEPHRINE 0.3 MG/0.3ML IJ SOAJ: 0.3000 mg | Freq: Once | INTRAMUSCULAR | Status: DC | PRN

## 2020-02-04 MED ORDER — ALBUTEROL SULFATE HFA 108 (90 BASE) MCG/ACT IN AERS: 2.0000 | INHALATION_SPRAY | Freq: Once | RESPIRATORY_TRACT | Status: DC | PRN

## 2020-02-04 MED ORDER — SODIUM CHLORIDE 0.9 % IV SOLN: INTRAVENOUS | Status: DC | PRN

## 2020-02-04 NOTE — Progress Notes (Signed)
°  Diagnosis: COVID-19 ° °Physician: Dr. Wright ° °Procedure: [facility] Infusion Clinic Med: casirivimab\imdevimab infusion - Provided patient with casirivimab\imdevimab fact sheet for patients, parents and caregivers prior to infusion. ° °Complications: No immediate complications noted. ° °Discharge: Discharged home  ° °Valerie Evans °02/04/2020 ° ° °

## 2020-02-04 NOTE — Discharge Instructions (Signed)

## 2020-02-14 ENCOUNTER — Encounter: Payer: Self-pay | Admitting: Physician Assistant

## 2020-02-27 ENCOUNTER — Ambulatory Visit (INDEPENDENT_AMBULATORY_CARE_PROVIDER_SITE_OTHER): Payer: 59 | Admitting: Physician Assistant

## 2020-02-27 ENCOUNTER — Other Ambulatory Visit: Payer: Self-pay

## 2020-02-27 ENCOUNTER — Encounter: Payer: Self-pay | Admitting: Physician Assistant

## 2020-02-27 VITALS — BP 99/69 | HR 99

## 2020-02-27 DIAGNOSIS — F900 Attention-deficit hyperactivity disorder, predominantly inattentive type: Secondary | ICD-10-CM

## 2020-02-27 DIAGNOSIS — F4310 Post-traumatic stress disorder, unspecified: Secondary | ICD-10-CM

## 2020-02-27 DIAGNOSIS — F331 Major depressive disorder, recurrent, moderate: Secondary | ICD-10-CM | POA: Diagnosis not present

## 2020-02-27 DIAGNOSIS — F411 Generalized anxiety disorder: Secondary | ICD-10-CM

## 2020-02-27 MED ORDER — TRAZODONE HCL 100 MG PO TABS: 100.0000 mg | ORAL_TABLET | Freq: Every evening | ORAL | 1 refills | Status: DC | PRN

## 2020-02-27 MED ORDER — BUPROPION HCL ER (XL) 300 MG PO TB24: 300.0000 mg | ORAL_TABLET | Freq: Every day | ORAL | 1 refills | Status: DC

## 2020-02-27 MED ORDER — ATOMOXETINE HCL 100 MG PO CAPS: 100.0000 mg | ORAL_CAPSULE | Freq: Every day | ORAL | 1 refills | Status: DC

## 2020-02-27 NOTE — Progress Notes (Signed)
[facility]  Patient ID: Valerie Evans,  MRN: [id number]  PCP: Waldon Merl, PA-C  Date of Evaluation: 02/27/2020 Time spent:20 minutes  Chief Complaint:  Chief Complaint    Anxiety; Depression; Insomnia      HISTORY/CURRENT STATUS: For routine med check.   Still has trouble focusing and finishing things. States she has clothes that she needs to something with but doesn't even know where to start to straighten and organize them. Also very scattered at work. Can't concentrate well, distracted easily.  Strattera increase didn't help at all.  She stopped the modafinil because it was not helping.  Has been off of it for approximately 2 weeks.  Still feels depressed most of the time. Usually works and sleep, thoughts about it.  Still does not really enjoy things and energy and motivation are lacking. That has not kept her from going to work though.  No trouble going to sleep but does have trouble staying asleep sometimes. No nightmares lately.  Personal hygiene is normal.  She is not crying easily.  No suicidal or homicidal thoughts.  She still has a lot of anxiety.  The Klonopin does help though.  If she does not take it, she feels jittery and panicky.  She does take it routinely for that reason.  Not having panic attacks.  Patient denies increased energy with decreased need for sleep, no increased talkativeness, no racing thoughts, no impulsivity or risky behaviors, no increased spending, no increased libido, no grandiosity, no increased irritability or anger, and no hallucinations.  Denies dizziness, syncope, seizures, numbness, tingling, tremor, tics, unsteady gait, slurred speech, confusion. Denies muscle or joint pain, stiffness, or dystonia.  Individual Medical History/ Review of Systems: Changes? :No    Past medications for mental health diagnoses include: Cymbalta, Zoloft, Wellbutrin, Xanax, trazodone, Lexapro, Elavil, Sonata Klonopin, Gabapentin, Prozac,  Rexulti, Abilify, Modafanil  Allergies: Patient has no known allergies.  Current Medications:  Current Outpatient Medications:  .  albuterol (VENTOLIN HFA) 108 (90 Base) MCG/ACT inhaler, Inhale 1-2 puffs into the lungs every 6 (six) hours as needed for wheezing or shortness of breath., Disp: 6.7 g, Rfl: 1 .  beclomethasone (QVAR REDIHALER) 80 MCG/ACT inhaler, Inhale 1 puff into the lungs 2 (two) times daily., Disp: 10.6 g, Rfl: 1 .  brexpiprazole (REXULTI) 2 MG TABS tablet, Take 1 tablet (2 mg total) by mouth daily., Disp: 30 tablet, Rfl: 1 .  clonazePAM (KLONOPIN) 1 MG tablet, TAKE 1 TABLET 3 TIMES A DAY AND MAY TAKE 1/2 TABLET MID-DAY ON OCCASION AS NEEDED, Disp: 105 tablet, Rfl: 1 .  gabapentin (NEURONTIN) 600 MG tablet, Take 1 tablet (600 mg total) by mouth 2 (two) times daily., Disp: 60 tablet, Rfl: 1 .  hydroxychloroquine (PLAQUENIL) 200 MG tablet, Take 100 mg by mouth daily., Disp: , Rfl:  .  levothyroxine (SYNTHROID) 25 MCG tablet, TAKE 1 TABLET BY MOUTH DAILY BEFORE BREAKFAST., Disp: 90 tablet, Rfl: 0 .  Multiple Vitamin (MULTIVITAMIN WITH MINERALS) TABS tablet, Take 1 tablet by mouth daily., Disp: , Rfl:  .  traZODone (DESYREL) 100 MG tablet, Take 1 tablet (100 mg total) by mouth at bedtime as needed for sleep., Disp: 180 tablet, Rfl: 1 .  atomoxetine (STRATTERA) 100 MG capsule, Take 1 capsule (100 mg total) by mouth daily., Disp: 30 capsule, Rfl: 1 .  buPROPion (WELLBUTRIN XL) 300 MG 24 hr tablet, Take 1 tablet (300 mg total) by mouth daily., Disp: 30 tablet, Rfl: 1 .  modafinil (PROVIGIL) 200 MG  tablet, TAKE ONE TABLET BY MOUTH DAILY (Patient not taking: Reported on 02/27/2020), Disp: 30 tablet, Rfl: 5 .  sulfamethoxazole-trimethoprim (BACTRIM DS) 800-160 MG tablet, Take 1 tablet by mouth 2 (two) times daily. (Patient not taking: Reported on 02/27/2020), Disp: 10 tablet, Rfl: 0 Medication Side Effects: none  Family Medical/ Social History: Changes?  No   MENTAL HEALTH EXAM:  Blood  pressure 99/69, pulse 99.There is no height or weight on file to calculate BMI.  General Appearance: Casual, Neat and Well Groomed  Eye Contact:  Good  Speech:  Clear and Coherent and Normal Rate  Volume:  Normal  Mood:  Depressed  Affect:  Appropriate  Thought Process:  Goal Directed and Descriptions of Associations: Intact  Orientation:  Full (Time, Place, and Person)  Thought Content: Logical   Suicidal Thoughts:  No  Homicidal Thoughts:  No  Memory:  WNL  Judgement:  Good  Insight:  Good  Psychomotor Activity:  Normal  Concentration:  Concentration: Good and Attention Span: Fair  Recall:  Good  Fund of Knowledge: Good  Language: Good  Assets:  Desire for Improvement  ADL's:  Intact  Cognition: WNL  Prognosis:  Good    DIAGNOSES:    ICD-10-CM   1. Major depressive disorder, recurrent episode, moderate (HCC)  F33.1   2. PTSD (post-traumatic stress disorder)  F43.10   3. Attention deficit hyperactivity disorder (ADHD), predominantly inattentive type  F90.0   4. Generalized anxiety disorder  F41.1     Receiving Psychotherapy: No    RECOMMENDATIONS:  PDMP was reviewed. I provided 20 minutes of face-to-face time during this encounter. We discussed options for treating the ADD.  Because she needs the Klonopin, we cannot use a stimulant.  She did not respond to the increase of Strattera but we discussed increasing to the maximum dose and see if that will work.  If not, clonidine or guanfacine may be effective, but her blood pressure is low normal anyway so I am not sure that it is really an option.  Counseling may be very beneficial for this issue as well. Increase Strattera to 100 mg. Increase Wellbutrin XL to 300 mg q am. Continue Rexulti to 2 mg p.o. every morning. Continue Klonopin 1 mg 3 times daily as needed and one half midday as needed. Continue gabapentin 600 mg 1 p.o. twice daily. Recommend therapy. Return in 6 weeks.  Melony Overly, PA-C

## 2020-03-16 ENCOUNTER — Other Ambulatory Visit: Payer: Self-pay | Admitting: Physician Assistant

## 2020-04-03 ENCOUNTER — Other Ambulatory Visit: Payer: Self-pay | Admitting: Physician Assistant

## 2020-04-05 ENCOUNTER — Other Ambulatory Visit: Payer: Self-pay | Admitting: Physician Assistant

## 2020-04-05 NOTE — Telephone Encounter (Signed)
Apt 11/15

## 2020-04-09 ENCOUNTER — Encounter: Payer: Self-pay | Admitting: Physician Assistant

## 2020-04-09 ENCOUNTER — Other Ambulatory Visit: Payer: Self-pay

## 2020-04-09 ENCOUNTER — Ambulatory Visit (INDEPENDENT_AMBULATORY_CARE_PROVIDER_SITE_OTHER): Payer: 59 | Admitting: Physician Assistant

## 2020-04-09 VITALS — BP 123/84 | HR 86

## 2020-04-09 DIAGNOSIS — F411 Generalized anxiety disorder: Secondary | ICD-10-CM

## 2020-04-09 DIAGNOSIS — F900 Attention-deficit hyperactivity disorder, predominantly inattentive type: Secondary | ICD-10-CM

## 2020-04-09 DIAGNOSIS — F4310 Post-traumatic stress disorder, unspecified: Secondary | ICD-10-CM

## 2020-04-09 DIAGNOSIS — F331 Major depressive disorder, recurrent, moderate: Secondary | ICD-10-CM

## 2020-04-09 MED ORDER — BREXPIPRAZOLE 2 MG PO TABS: 2.0000 mg | ORAL_TABLET | Freq: Every day | ORAL | 1 refills | Status: DC

## 2020-04-09 MED ORDER — BUPROPION HCL ER (XL) 150 MG PO TB24: 150.0000 mg | ORAL_TABLET | Freq: Every day | ORAL | 1 refills | Status: DC

## 2020-04-09 MED ORDER — GABAPENTIN 600 MG PO TABS: 600.0000 mg | ORAL_TABLET | Freq: Two times a day (BID) | ORAL | 1 refills | Status: DC

## 2020-04-09 NOTE — Progress Notes (Signed)
[facility]  Patient ID: Valerie Evans,  MRN: [id number]  PCP: Valerie Evans  Date of Evaluation: 04/09/2020 Time spent:30 minutes  Chief Complaint:  Chief Complaint    Anxiety; Depression; Insomnia; Follow-up      HISTORY/CURRENT STATUS: For routine med check.   Still struggles at work, slow to get things done b/c can't focus. Hasn't missed work or gotten in trouble b/c of it.  Still has trouble focusing and finishing things. Can't concentrate well, distracted easily.    Doesn't do anything fun. No enjoyment. Loves to sleep. If she's not asleep, which she loves, she's sitting on the couch thinking about the million things she has to do. No hobbies.  Personal hygiene is normal.  She is not crying easily but she hold back tears a lot.  No suicidal or homicidal thoughts.  She still has a lot of anxiety.  The Klonopin does help though.  If she does not take it, she feels jittery and panicky.  She does take it routinely for that reason.  Not having panic attacks.  Patient denies increased energy with decreased need for sleep, no increased talkativeness, no racing thoughts, no impulsivity or risky behaviors, no increased spending, no increased libido, no grandiosity, no increased irritability or anger, and no hallucinations.  Denies dizziness, syncope, seizures, numbness, tingling, tremor, tics, unsteady gait, slurred speech, confusion. Denies muscle or joint pain, stiffness, or dystonia.  Individual Medical History/ Review of Systems: Changes? :No    Past medications for mental health diagnoses include: Cymbalta, Zoloft, Wellbutrin, Xanax, trazodone, Lexapro, Elavil, Sonata Klonopin, Gabapentin, Prozac, Rexulti, Abilify, Modafanil  Allergies: Patient has no known allergies.  Current Medications:  Current Outpatient Medications:  .  albuterol (VENTOLIN HFA) 108 (90 Base) MCG/ACT inhaler, Inhale 1-2 puffs into the lungs every 6 (six) hours as needed for  wheezing or shortness of breath., Disp: 6.7 g, Rfl: 1 .  atomoxetine (STRATTERA) 100 MG capsule, TAKE 1 CAPSULE BY MOUTH EVERY DAY, Disp: 90 capsule, Rfl: 0 .  beclomethasone (QVAR REDIHALER) 80 MCG/ACT inhaler, Inhale 1 puff into the lungs 2 (two) times daily., Disp: 10.6 g, Rfl: 1 .  brexpiprazole (REXULTI) 2 MG TABS tablet, Take 1 tablet (2 mg total) by mouth daily., Disp: 30 tablet, Rfl: 1 .  buPROPion (WELLBUTRIN XL) 300 MG 24 hr tablet, TAKE 1 TABLET BY MOUTH EVERY DAY, Disp: 90 tablet, Rfl: 0 .  clonazePAM (KLONOPIN) 1 MG tablet, TAKE 1 TABLET 3 TIMES A DAY AND MAY TAKE 1/2 TABLET MID-DAY ON OCCASION AS NEEDED, Disp: 105 tablet, Rfl: 1 .  gabapentin (NEURONTIN) 600 MG tablet, Take 1 tablet (600 mg total) by mouth 2 (two) times daily., Disp: 60 tablet, Rfl: 1 .  hydroxychloroquine (PLAQUENIL) 200 MG tablet, Take 100 mg by mouth daily., Disp: , Rfl:  .  levothyroxine (SYNTHROID) 25 MCG tablet, TAKE 1 TABLET BY MOUTH DAILY BEFORE BREAKFAST., Disp: 90 tablet, Rfl: 0 .  Multiple Vitamin (MULTIVITAMIN WITH MINERALS) TABS tablet, Take 1 tablet by mouth daily., Disp: , Rfl:  .  traZODone (DESYREL) 100 MG tablet, Take 1 tablet (100 mg total) by mouth at bedtime as needed for sleep., Disp: 180 tablet, Rfl: 1 .  buPROPion (WELLBUTRIN XL) 150 MG 24 hr tablet, Take 1 tablet (150 mg total) by mouth daily. Take with 300 mg=450 mg qd., Disp: 90 tablet, Rfl: 1 Medication Side Effects: none  Family Medical/ Social History: Changes?  No   MENTAL HEALTH EXAM:  Blood pressure 123/84, pulse  86.There is no height or weight on file to calculate BMI.  General Appearance: Casual, Neat and Well Groomed  Eye Contact:  Good  Speech:  Clear and Coherent and Normal Rate  Volume:  Normal  Mood:  Depressed  Affect:  Appropriate  Thought Process:  Goal Directed and Descriptions of Associations: Intact  Orientation:  Full (Time, Place, and Person)  Thought Content: Logical   Suicidal Thoughts:  No  Homicidal  Thoughts:  No  Memory:  WNL  Judgement:  Good  Insight:  Good  Psychomotor Activity:  Normal  Concentration:  Concentration: Good and Attention Span: Fair  Recall:  Good  Fund of Knowledge: Good  Language: Good  Assets:  Desire for Improvement  ADL's:  Intact  Cognition: WNL  Prognosis:  Good    DIAGNOSES:    ICD-10-CM   1. Attention deficit hyperactivity disorder (ADHD), predominantly inattentive type  F90.0   2. Major depressive disorder, recurrent episode, moderate (HCC)  F33.1   3. PTSD (post-traumatic stress disorder)  F43.10   4. Generalized anxiety disorder  F41.1     Receiving Psychotherapy: No    RECOMMENDATIONS:  PDMP was reviewed. I provided 30 minutes of face-to-face time during this encounter. We discussed other options for treating the ADD.  Again discussed we can't mix BZ and stimulants. Clonidine or guanfacine may be effective, but can decrease BP. States her blood pressure is low normal anyway so would like to avoid unless nothing else works. Increasing Wellbutrin may be helpful, as a mild stimulant. She would like to try. Only make 1 change at a time, so other meds remain the same. BP is nl and we've discussed the Clonidine and Guanfacine, so can start that by phone if Wellbutrin increase isn't helpful for concentration in 3-4 weeks.  Continue Strattera to 100 mg. Increase  Wellbutrin XL 150 mg, 3 po q am.  Continue Rexulti to 2 mg p.o. every morning. Continue Klonopin 1 mg 3 times daily as needed and one half midday as needed. Continue gabapentin 600 mg 1 p.o. twice daily. Continue Trazodone 100 mg qhs prn.  Recommend therapy. Return in 6 weeks.  Melony Overly, PA-C

## 2020-04-14 ENCOUNTER — Encounter: Payer: Self-pay | Admitting: Physician Assistant

## 2020-04-24 ENCOUNTER — Other Ambulatory Visit: Payer: Self-pay | Admitting: Urology

## 2020-04-24 ENCOUNTER — Other Ambulatory Visit (HOSPITAL_COMMUNITY): Payer: Self-pay | Admitting: Urology

## 2020-04-24 DIAGNOSIS — N3000 Acute cystitis without hematuria: Secondary | ICD-10-CM

## 2020-04-25 ENCOUNTER — Other Ambulatory Visit (HOSPITAL_COMMUNITY): Payer: Self-pay | Admitting: Urology

## 2020-04-25 DIAGNOSIS — N3000 Acute cystitis without hematuria: Secondary | ICD-10-CM

## 2020-04-30 ENCOUNTER — Encounter: Payer: Self-pay | Admitting: Physician Assistant

## 2020-04-30 ENCOUNTER — Other Ambulatory Visit: Payer: Self-pay | Admitting: Physician Assistant

## 2020-04-30 MED ORDER — LEVOTHYROXINE SODIUM 25 MCG PO TABS: ORAL_TABLET | ORAL | 1 refills | Status: DC

## 2020-05-03 ENCOUNTER — Ambulatory Visit (HOSPITAL_COMMUNITY): Payer: 59

## 2020-05-04 ENCOUNTER — Ambulatory Visit (HOSPITAL_COMMUNITY): Payer: 59

## 2020-05-08 ENCOUNTER — Other Ambulatory Visit: Payer: Self-pay

## 2020-05-08 ENCOUNTER — Inpatient Hospital Stay (HOSPITAL_COMMUNITY)
Admission: EM | Admit: 2020-05-08 | Discharge: 2020-05-14 | DRG: 030 | Disposition: A | Discharge status: Home Health | Payer: 59 | Attending: Neurosurgery | Admitting: Neurosurgery

## 2020-05-08 ENCOUNTER — Emergency Department (HOSPITAL_COMMUNITY): Payer: 59

## 2020-05-08 DIAGNOSIS — F430 Acute stress reaction: Secondary | ICD-10-CM | POA: Diagnosis not present

## 2020-05-08 DIAGNOSIS — Z23 Encounter for immunization: Secondary | ICD-10-CM

## 2020-05-08 DIAGNOSIS — F4310 Post-traumatic stress disorder, unspecified: Secondary | ICD-10-CM | POA: Diagnosis present

## 2020-05-08 DIAGNOSIS — Z79890 Hormone replacement therapy (postmenopausal): Secondary | ICD-10-CM | POA: Diagnosis not present

## 2020-05-08 DIAGNOSIS — S34102A Unspecified injury to L2 level of lumbar spinal cord, initial encounter: Secondary | ICD-10-CM | POA: Diagnosis present

## 2020-05-08 DIAGNOSIS — Z20822 Contact with and (suspected) exposure to covid-19: Secondary | ICD-10-CM | POA: Diagnosis present

## 2020-05-08 DIAGNOSIS — Y92410 Unspecified street and highway as the place of occurrence of the external cause: Secondary | ICD-10-CM | POA: Diagnosis not present

## 2020-05-08 DIAGNOSIS — Z8249 Family history of ischemic heart disease and other diseases of the circulatory system: Secondary | ICD-10-CM | POA: Diagnosis not present

## 2020-05-08 DIAGNOSIS — Z818 Family history of other mental and behavioral disorders: Secondary | ICD-10-CM | POA: Diagnosis not present

## 2020-05-08 DIAGNOSIS — E039 Hypothyroidism, unspecified: Secondary | ICD-10-CM | POA: Diagnosis present

## 2020-05-08 DIAGNOSIS — F411 Generalized anxiety disorder: Secondary | ICD-10-CM | POA: Diagnosis present

## 2020-05-08 DIAGNOSIS — F419 Anxiety disorder, unspecified: Secondary | ICD-10-CM | POA: Diagnosis present

## 2020-05-08 DIAGNOSIS — Z8619 Personal history of other infectious and parasitic diseases: Secondary | ICD-10-CM

## 2020-05-08 DIAGNOSIS — F329 Major depressive disorder, single episode, unspecified: Secondary | ICD-10-CM | POA: Diagnosis present

## 2020-05-08 DIAGNOSIS — J45909 Unspecified asthma, uncomplicated: Secondary | ICD-10-CM | POA: Diagnosis present

## 2020-05-08 DIAGNOSIS — Z803 Family history of malignant neoplasm of breast: Secondary | ICD-10-CM

## 2020-05-08 DIAGNOSIS — S32022A Unstable burst fracture of second lumbar vertebra, initial encounter for closed fracture: Secondary | ICD-10-CM | POA: Diagnosis present

## 2020-05-08 DIAGNOSIS — Z419 Encounter for procedure for purposes other than remedying health state, unspecified: Secondary | ICD-10-CM

## 2020-05-08 DIAGNOSIS — Z83438 Family history of other disorder of lipoprotein metabolism and other lipidemia: Secondary | ICD-10-CM | POA: Diagnosis not present

## 2020-05-08 DIAGNOSIS — R200 Anesthesia of skin: Secondary | ICD-10-CM | POA: Diagnosis not present

## 2020-05-08 DIAGNOSIS — S32001A Stable burst fracture of unspecified lumbar vertebra, initial encounter for closed fracture: Secondary | ICD-10-CM | POA: Diagnosis present

## 2020-05-08 DIAGNOSIS — S82302A Unspecified fracture of lower end of left tibia, initial encounter for closed fracture: Secondary | ICD-10-CM | POA: Diagnosis present

## 2020-05-08 DIAGNOSIS — Z79899 Other long term (current) drug therapy: Secondary | ICD-10-CM | POA: Diagnosis not present

## 2020-05-08 DIAGNOSIS — M549 Dorsalgia, unspecified: Secondary | ICD-10-CM | POA: Diagnosis present

## 2020-05-08 DIAGNOSIS — M48061 Spinal stenosis, lumbar region without neurogenic claudication: Secondary | ICD-10-CM | POA: Diagnosis present

## 2020-05-08 LAB — I-STAT CHEM 8, ED
BUN: 13 mg/dL (ref 6–20)
Calcium, Ion: 1.13 mmol/L — ABNORMAL LOW (ref 1.15–1.40)
Chloride: 105 mmol/L (ref 98–111)
Creatinine, Ser: 0.8 mg/dL (ref 0.44–1.00)
Glucose, Bld: 102 mg/dL — ABNORMAL HIGH (ref 70–99)
HCT: 41 % (ref 36.0–46.0)
Hemoglobin: 13.9 g/dL (ref 12.0–15.0)
Potassium: 4.3 mmol/L (ref 3.5–5.1)
Sodium: 139 mmol/L (ref 135–145)
TCO2: 26 mmol/L (ref 22–32)

## 2020-05-08 LAB — RESP PANEL BY RT-PCR (FLU A&B, COVID) ARPGX2
Influenza A by PCR: NEGATIVE
Influenza B by PCR: NEGATIVE
SARS Coronavirus 2 by RT PCR: NEGATIVE

## 2020-05-08 LAB — I-STAT BETA HCG BLOOD, ED (MC, WL, AP ONLY): I-stat hCG, quantitative: <5 m[IU]/mL (ref <5)

## 2020-05-08 MED ORDER — BREXPIPRAZOLE 2 MG PO TABS: 2.0000 mg | ORAL_TABLET | Freq: Every day | ORAL | Status: DC

## 2020-05-08 MED ORDER — MENTHOL 3 MG MT LOZG
1.0000 | LOZENGE | OROMUCOSAL | Status: DC | PRN
  Filled 2020-05-08: qty 9

## 2020-05-08 MED ORDER — FLEET ENEMA 7-19 GM/118ML RE ENEM: 1.0000 | ENEMA | Freq: Once | RECTAL | Status: DC | PRN

## 2020-05-08 MED ORDER — CEFAZOLIN SODIUM-DEXTROSE 2-4 GM/100ML-% IV SOLN
2.0000 g | Freq: Three times a day (TID) | INTRAVENOUS | Status: AC
Start: 2020-05-08 — End: 2020-05-08
  Administered 2020-05-08 (×2): 2 g via INTRAVENOUS
  Filled 2020-05-08 (×3): qty 100

## 2020-05-08 MED ORDER — IOHEXOL 300 MG/ML  SOLN
80.0000 mL | Freq: Once | INTRAMUSCULAR | Status: AC | PRN
  Administered 2020-05-08: 80 mL via INTRAVENOUS

## 2020-05-08 MED ORDER — BECLOMETHASONE DIPROP HFA 80 MCG/ACT IN AERB: 1.0000 | INHALATION_SPRAY | Freq: Two times a day (BID) | RESPIRATORY_TRACT | Status: DC

## 2020-05-08 MED ORDER — GABAPENTIN 600 MG PO TABS
600.0000 mg | ORAL_TABLET | Freq: Two times a day (BID) | ORAL | Status: DC
  Administered 2020-05-08: 22:00:00 600 mg via ORAL
  Filled 2020-05-08 (×3): qty 1

## 2020-05-08 MED ORDER — SODIUM CHLORIDE 0.9 % IV SOLN: INTRAVENOUS | Status: DC

## 2020-05-08 MED ORDER — SENNOSIDES-DOCUSATE SODIUM 8.6-50 MG PO TABS: 1.0000 | ORAL_TABLET | Freq: Every evening | ORAL | Status: DC | PRN

## 2020-05-08 MED ORDER — TRAZODONE HCL 100 MG PO TABS
100.0000 mg | ORAL_TABLET | Freq: Every evening | ORAL | Status: DC | PRN
  Administered 2020-05-09 – 2020-05-13 (×6): 100 mg via ORAL
  Filled 2020-05-08 (×2): qty 1
  Filled 2020-05-08: qty 2
  Filled 2020-05-08 (×3): qty 1

## 2020-05-08 MED ORDER — BUPROPION HCL ER (XL) 150 MG PO TB24: 150.0000 mg | ORAL_TABLET | Freq: Every day | ORAL | Status: DC

## 2020-05-08 MED ORDER — ONDANSETRON HCL 4 MG/2ML IJ SOLN
4.0000 mg | Freq: Four times a day (QID) | INTRAMUSCULAR | Status: DC | PRN
  Administered 2020-05-08 – 2020-05-10 (×6): 4 mg via INTRAVENOUS
  Filled 2020-05-08 (×6): qty 2

## 2020-05-08 MED ORDER — FENTANYL CITRATE (PF) 100 MCG/2ML IJ SOLN
50.0000 ug | Freq: Once | INTRAMUSCULAR | Status: AC
  Administered 2020-05-08: 10:00:00 50 ug via INTRAVENOUS
  Filled 2020-05-08: qty 2

## 2020-05-08 MED ORDER — METHOCARBAMOL 500 MG PO TABS
500.0000 mg | ORAL_TABLET | Freq: Four times a day (QID) | ORAL | Status: DC | PRN
  Administered 2020-05-08 – 2020-05-14 (×16): 500 mg via ORAL
  Filled 2020-05-08 (×16): qty 1

## 2020-05-08 MED ORDER — ACETAMINOPHEN 500 MG PO TABS
1000.0000 mg | ORAL_TABLET | Freq: Four times a day (QID) | ORAL | Status: AC
  Administered 2020-05-08 – 2020-05-09 (×3): 1000 mg via ORAL
  Filled 2020-05-08 (×3): qty 2

## 2020-05-08 MED ORDER — ONDANSETRON HCL 4 MG PO TABS
4.0000 mg | ORAL_TABLET | Freq: Four times a day (QID) | ORAL | Status: DC | PRN
  Administered 2020-05-08: 22:00:00 4 mg via ORAL
  Filled 2020-05-08: qty 1

## 2020-05-08 MED ORDER — METHOCARBAMOL 1000 MG/10ML IJ SOLN
500.0000 mg | Freq: Four times a day (QID) | INTRAVENOUS | Status: DC | PRN
  Filled 2020-05-08: qty 5

## 2020-05-08 MED ORDER — HYDROXYCHLOROQUINE SULFATE 200 MG PO TABS
100.0000 mg | ORAL_TABLET | Freq: Every day | ORAL | Status: DC
  Administered 2020-05-10 – 2020-05-14 (×5): 100 mg via ORAL
  Filled 2020-05-08 (×6): qty 0.5

## 2020-05-08 MED ORDER — ADULT MULTIVITAMIN W/MINERALS CH
1.0000 | ORAL_TABLET | Freq: Every day | ORAL | Status: DC
  Administered 2020-05-10 – 2020-05-14 (×5): 1 via ORAL
  Filled 2020-05-08 (×5): qty 1

## 2020-05-08 MED ORDER — ACETAMINOPHEN 650 MG RE SUPP: 650.0000 mg | RECTAL | Status: DC | PRN

## 2020-05-08 MED ORDER — BUPROPION HCL ER (XL) 150 MG PO TB24
300.0000 mg | ORAL_TABLET | Freq: Every day | ORAL | Status: DC
  Administered 2020-05-10 – 2020-05-14 (×5): 300 mg via ORAL
  Filled 2020-05-08 (×6): qty 2

## 2020-05-08 MED ORDER — HYDROMORPHONE HCL 1 MG/ML IJ SOLN
0.5000 mg | INTRAMUSCULAR | Status: DC | PRN
  Administered 2020-05-08 (×2): 1 mg via INTRAVENOUS
  Administered 2020-05-08: 0.5 mg via INTRAVENOUS
  Administered 2020-05-09 – 2020-05-12 (×16): 1 mg via INTRAVENOUS
  Filled 2020-05-08 (×20): qty 1

## 2020-05-08 MED ORDER — LEVOTHYROXINE SODIUM 25 MCG PO TABS
25.0000 ug | ORAL_TABLET | Freq: Every day | ORAL | Status: DC
  Administered 2020-05-10 – 2020-05-14 (×5): 25 ug via ORAL
  Filled 2020-05-08 (×6): qty 1

## 2020-05-08 MED ORDER — MORPHINE SULFATE (PF) 4 MG/ML IV SOLN
4.0000 mg | Freq: Once | INTRAVENOUS | Status: DC
  Filled 2020-05-08: qty 1

## 2020-05-08 MED ORDER — FENTANYL CITRATE (PF) 100 MCG/2ML IJ SOLN
50.0000 ug | Freq: Once | INTRAMUSCULAR | Status: AC
  Administered 2020-05-08: 09:00:00 50 ug via INTRAVENOUS
  Filled 2020-05-08: qty 2

## 2020-05-08 MED ORDER — ONDANSETRON HCL 4 MG/2ML IJ SOLN
4.0000 mg | Freq: Once | INTRAMUSCULAR | Status: AC
  Administered 2020-05-08: 09:00:00 4 mg via INTRAVENOUS
  Filled 2020-05-08: qty 2

## 2020-05-08 MED ORDER — DOCUSATE SODIUM 100 MG PO CAPS
100.0000 mg | ORAL_CAPSULE | Freq: Two times a day (BID) | ORAL | Status: DC
  Administered 2020-05-08 – 2020-05-14 (×11): 100 mg via ORAL
  Filled 2020-05-08 (×11): qty 1

## 2020-05-08 MED ORDER — TETANUS-DIPHTH-ACELL PERTUSSIS 5-2.5-18.5 LF-MCG/0.5 IM SUSY
0.5000 mL | PREFILLED_SYRINGE | Freq: Once | INTRAMUSCULAR | Status: AC
  Administered 2020-05-08: 09:00:00 0.5 mL via INTRAMUSCULAR
  Filled 2020-05-08: qty 0.5

## 2020-05-08 MED ORDER — HYDROCODONE-ACETAMINOPHEN 5-325 MG PO TABS
1.0000 | ORAL_TABLET | ORAL | Status: DC | PRN
  Administered 2020-05-12 – 2020-05-14 (×2): 1 via ORAL
  Filled 2020-05-08 (×2): qty 1

## 2020-05-08 MED ORDER — SODIUM CHLORIDE 0.9 % IV SOLN
250.0000 mL | INTRAVENOUS | Status: DC
  Administered 2020-05-08: 13:00:00 250 mL via INTRAVENOUS

## 2020-05-08 MED ORDER — ALBUTEROL SULFATE HFA 108 (90 BASE) MCG/ACT IN AERS
1.0000 | INHALATION_SPRAY | Freq: Four times a day (QID) | RESPIRATORY_TRACT | Status: DC | PRN
  Filled 2020-05-08: qty 6.7

## 2020-05-08 MED ORDER — OXYCODONE HCL 5 MG PO TABS
5.0000 mg | ORAL_TABLET | ORAL | Status: DC | PRN
  Administered 2020-05-08: 5 mg via ORAL
  Administered 2020-05-08 – 2020-05-09 (×2): 10 mg via ORAL
  Administered 2020-05-09: 5 mg via ORAL
  Administered 2020-05-09 – 2020-05-14 (×20): 10 mg via ORAL
  Filled 2020-05-08 (×7): qty 2
  Filled 2020-05-08: qty 1
  Filled 2020-05-08 (×16): qty 2

## 2020-05-08 MED ORDER — PHENOL 1.4 % MT LIQD
1.0000 | OROMUCOSAL | Status: DC | PRN
  Filled 2020-05-08: qty 177

## 2020-05-08 MED ORDER — SODIUM CHLORIDE 0.9% FLUSH
3.0000 mL | Freq: Two times a day (BID) | INTRAVENOUS | Status: DC
  Administered 2020-05-08 – 2020-05-13 (×6): 3 mL via INTRAVENOUS

## 2020-05-08 MED ORDER — SENNA 8.6 MG PO TABS
1.0000 | ORAL_TABLET | Freq: Two times a day (BID) | ORAL | Status: DC
  Administered 2020-05-08 – 2020-05-14 (×11): 8.6 mg via ORAL
  Filled 2020-05-08 (×11): qty 1

## 2020-05-08 MED ORDER — ACETAMINOPHEN 325 MG PO TABS
650.0000 mg | ORAL_TABLET | ORAL | Status: DC | PRN
  Administered 2020-05-10: 650 mg via ORAL
  Filled 2020-05-08: qty 2

## 2020-05-08 MED ORDER — GABAPENTIN 300 MG PO CAPS: 300.0000 mg | ORAL_CAPSULE | Freq: Three times a day (TID) | ORAL | Status: DC

## 2020-05-08 MED ORDER — CEFAZOLIN SODIUM-DEXTROSE 2-4 GM/100ML-% IV SOLN
2.0000 g | INTRAVENOUS | Status: AC
  Administered 2020-05-09: 2 g via INTRAVENOUS
  Filled 2020-05-08 (×2): qty 100

## 2020-05-08 MED ORDER — BISACODYL 10 MG RE SUPP
10.0000 mg | Freq: Every day | RECTAL | Status: DC | PRN
Start: 2020-05-08 — End: 2020-05-14

## 2020-05-08 MED ORDER — SODIUM CHLORIDE 0.9% FLUSH: 3.0000 mL | INTRAVENOUS | Status: DC | PRN
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
## 2020-05-08 NOTE — Progress Notes (Signed)
Orthopedic Tech Progress Note Patient Details:  Valerie Evans 1985/09/08 [id number] Called in order to [facility] for a TLSO Patient ID: Valerie Evans, female   DOB: 1986/01/18, 34 y.o.   MRN: [id number]   [name] 05/08/2020, 10:45 AM

## 2020-05-08 NOTE — ED Notes (Signed)
Messaged provider r/t pain control and patient's soft BP.

## 2020-05-08 NOTE — ED Notes (Signed)
Pt returned from CT °

## 2020-05-08 NOTE — Progress Notes (Signed)
Orthopedic Tech Progress Note Patient Details:  Valerie Evans 07/31/1985 [id number]  Ortho Devices Type of Ortho Device: Short leg splint,Stirrup splint Ortho Device/Splint Location: LLE Ortho Device/Splint Interventions: Ordered,Adjustment,Application   Post Interventions Patient Tolerated: Well Instructions Provided: Care of device   [name] 05/08/2020, 12:54 PM

## 2020-05-08 NOTE — TOC CAGE-AID Note (Signed)
Transition of Care [facility]) - CAGE-AID Screening   Patient Details  Name: Valerie Evans MRN: [id number] Date of Birth: 1985-08-19  Clinical Narrative: Patient declines drug and alcohol use. No resources required at this time.    CAGE-AID Screening:   Have You Ever Felt You Ought to Cut Down on Your Drinking or Drug Use?: No Have People Annoyed You By Critizing Your Drinking Or Drug Use?: No Have You Felt Bad Or Guilty About Your Drinking Or Drug Use?: No Have You Ever Had a Drink or Used Drugs First Thing In The Morning to Steady Your Nerves or to Get Rid of a Hangover?: No CAGE-AID Score: 0  Substance Abuse Education Offered: No

## 2020-05-08 NOTE — ED Notes (Signed)
Wasted 4mg  morphine with 

## 2020-05-08 NOTE — ED Notes (Signed)
Rounded on pt. Pt still complains of 10/10 pain. All VSS at this time

## 2020-05-08 NOTE — ED Notes (Signed)
Patient transported to CT 

## 2020-05-08 NOTE — ED Notes (Signed)
Provider in touch about pain control options. Orders are in [facility]
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
## 2020-05-09 ENCOUNTER — Encounter (HOSPITAL_COMMUNITY): Payer: Self-pay | Admitting: Neurosurgery

## 2020-05-09 ENCOUNTER — Inpatient Hospital Stay (HOSPITAL_COMMUNITY)
Admission: EM | Disposition: A | Discharge status: Home Health | Payer: Self-pay | Source: Home / Self Care | Attending: Neurosurgery

## 2020-05-09 ENCOUNTER — Inpatient Hospital Stay (HOSPITAL_COMMUNITY): Payer: 59

## 2020-05-09 ENCOUNTER — Inpatient Hospital Stay (HOSPITAL_COMMUNITY): Payer: 59 | Admitting: Certified Registered"

## 2020-05-09 HISTORY — PX: LUMBAR PERCUTANEOUS PEDICLE SCREW 2 LEVEL: SHX5561

## 2020-05-09 HISTORY — PX: APPLICATION OF ROBOTIC ASSISTANCE FOR SPINAL PROCEDURE: SHX6753

## 2020-05-09 LAB — TYPE AND SCREEN
ABO/RH(D): O POS
Antibody Screen: NEGATIVE

## 2020-05-09 LAB — BASIC METABOLIC PANEL
Anion gap: 10 (ref 5–15)
BUN: 7 mg/dL (ref 6–20)
CO2: 23 mmol/L (ref 22–32)
Calcium: 8.6 mg/dL — ABNORMAL LOW (ref 8.9–10.3)
Chloride: 103 mmol/L (ref 98–111)
Creatinine, Ser: 0.81 mg/dL (ref 0.44–1.00)
GFR, Estimated: >60 mL/min (ref >60)
Glucose, Bld: 95 mg/dL (ref 70–99)
Potassium: 4.1 mmol/L (ref 3.5–5.1)
Sodium: 136 mmol/L (ref 135–145)

## 2020-05-09 LAB — CBC
HCT: 39.7 % (ref 36.0–46.0)
Hemoglobin: 12.6 g/dL (ref 12.0–15.0)
MCH: 30.7 pg (ref 26.0–34.0)
MCHC: 31.7 g/dL (ref 30.0–36.0)
MCV: 96.6 fL (ref 80.0–100.0)
Platelets: 255 10*3/uL (ref 150–400)
RBC: 4.11 MIL/uL (ref 3.87–5.11)
RDW: 11.9 % (ref 11.5–15.5)
WBC: 7.7 10*3/uL (ref 4.0–10.5)
nRBC: 0 % (ref 0.0–0.2)

## 2020-05-09 LAB — SURGICAL PCR SCREEN
MRSA, PCR: NEGATIVE
Staphylococcus aureus: NEGATIVE

## 2020-05-09 LAB — ABO/RH: ABO/RH(D): O POS

## 2020-05-09 LAB — APTT: aPTT: 36 seconds (ref 24–36)

## 2020-05-09 LAB — PROTIME-INR
INR: 1 (ref 0.8–1.2)
Prothrombin Time: 13 seconds (ref 11.4–15.2)

## 2020-05-09 SURGERY — LUMBAR PERCUTANEOUS PEDICLE SCREW 2 LEVEL
Anesthesia: General

## 2020-05-09 MED ORDER — CEFAZOLIN SODIUM-DEXTROSE 2-4 GM/100ML-% IV SOLN
2.0000 g | Freq: Three times a day (TID) | INTRAVENOUS | Status: AC
  Administered 2020-05-10 (×2): 2 g via INTRAVENOUS
  Filled 2020-05-09 (×2): qty 100

## 2020-05-09 MED ORDER — MIDAZOLAM HCL 5 MG/5ML IJ SOLN
INTRAMUSCULAR | Status: DC | PRN
  Administered 2020-05-09: 2 mg via INTRAVENOUS

## 2020-05-09 MED ORDER — ACETAMINOPHEN 500 MG PO TABS
1000.0000 mg | ORAL_TABLET | Freq: Four times a day (QID) | ORAL | Status: AC
  Administered 2020-05-09 – 2020-05-10 (×3): 1000 mg via ORAL
  Filled 2020-05-09 (×3): qty 2

## 2020-05-09 MED ORDER — OXYCODONE HCL 5 MG PO TABS: 5.0000 mg | ORAL_TABLET | Freq: Once | ORAL | Status: DC | PRN

## 2020-05-09 MED ORDER — METHOCARBAMOL 500 MG PO TABS: 500.0000 mg | ORAL_TABLET | Freq: Four times a day (QID) | ORAL | Status: DC | PRN

## 2020-05-09 MED ORDER — LIDOCAINE 2% (20 MG/ML) 5 ML SYRINGE
INTRAMUSCULAR | Status: AC
  Filled 2020-05-09: qty 5

## 2020-05-09 MED ORDER — THROMBIN 20000 UNITS EX SOLR
CUTANEOUS | Status: AC
  Filled 2020-05-09: qty 20000

## 2020-05-09 MED ORDER — OXYCODONE HCL 5 MG/5ML PO SOLN: 5.0000 mg | Freq: Once | ORAL | Status: DC | PRN

## 2020-05-09 MED ORDER — GABAPENTIN 300 MG PO CAPS: 300.0000 mg | ORAL_CAPSULE | Freq: Three times a day (TID) | ORAL | Status: DC

## 2020-05-09 MED ORDER — HEMOSTATIC AGENTS (NO CHARGE) OPTIME
TOPICAL | Status: DC | PRN
  Administered 2020-05-09: 1 via TOPICAL

## 2020-05-09 MED ORDER — FENTANYL CITRATE (PF) 250 MCG/5ML IJ SOLN
INTRAMUSCULAR | Status: DC | PRN
  Administered 2020-05-09: 100 ug via INTRAVENOUS

## 2020-05-09 MED ORDER — THROMBIN 5000 UNITS EX SOLR
CUTANEOUS | Status: AC
  Filled 2020-05-09: qty 5000

## 2020-05-09 MED ORDER — BISACODYL 10 MG RE SUPP: 10.0000 mg | Freq: Every day | RECTAL | Status: DC | PRN

## 2020-05-09 MED ORDER — ACETAMINOPHEN 650 MG RE SUPP: 650.0000 mg | RECTAL | Status: DC | PRN

## 2020-05-09 MED ORDER — SODIUM CHLORIDE 0.9% FLUSH
3.0000 mL | Freq: Two times a day (BID) | INTRAVENOUS | Status: DC
  Administered 2020-05-09 – 2020-05-13 (×10): 3 mL via INTRAVENOUS

## 2020-05-09 MED ORDER — FENTANYL CITRATE (PF) 250 MCG/5ML IJ SOLN
INTRAMUSCULAR | Status: AC
  Filled 2020-05-09: qty 5

## 2020-05-09 MED ORDER — BUPIVACAINE HCL (PF) 0.5 % IJ SOLN
INTRAMUSCULAR | Status: DC | PRN
  Administered 2020-05-09: 4 mL

## 2020-05-09 MED ORDER — SODIUM CHLORIDE 0.9 % IV SOLN: 250.0000 mL | INTRAVENOUS | Status: DC

## 2020-05-09 MED ORDER — HYDROMORPHONE HCL 1 MG/ML IJ SOLN: 0.5000 mg | INTRAMUSCULAR | Status: DC | PRN

## 2020-05-09 MED ORDER — ACETAMINOPHEN 10 MG/ML IV SOLN
INTRAVENOUS | Status: AC
  Filled 2020-05-09: qty 100

## 2020-05-09 MED ORDER — FENTANYL CITRATE (PF) 100 MCG/2ML IJ SOLN
25.0000 ug | INTRAMUSCULAR | Status: DC | PRN
Start: 2020-05-09 — End: 2020-05-09

## 2020-05-09 MED ORDER — FLEET ENEMA 7-19 GM/118ML RE ENEM: 1.0000 | ENEMA | Freq: Once | RECTAL | Status: DC | PRN

## 2020-05-09 MED ORDER — HYDROMORPHONE HCL 1 MG/ML IJ SOLN
0.5000 mg | INTRAMUSCULAR | Status: DC | PRN
Start: 2020-05-09 — End: 2020-05-09

## 2020-05-09 MED ORDER — ALBUMIN HUMAN 5 % IV SOLN: INTRAVENOUS | Status: DC | PRN

## 2020-05-09 MED ORDER — BUPIVACAINE HCL (PF) 0.5 % IJ SOLN
INTRAMUSCULAR | Status: AC
  Filled 2020-05-09: qty 30

## 2020-05-09 MED ORDER — ROCURONIUM BROMIDE 10 MG/ML (PF) SYRINGE
PREFILLED_SYRINGE | INTRAVENOUS | Status: DC | PRN
  Administered 2020-05-09 (×2): 30 mg via INTRAVENOUS
  Administered 2020-05-09: 70 mg via INTRAVENOUS

## 2020-05-09 MED ORDER — ACETAMINOPHEN 325 MG PO TABS: 650.0000 mg | ORAL_TABLET | ORAL | Status: DC | PRN

## 2020-05-09 MED ORDER — LIDOCAINE 2% (20 MG/ML) 5 ML SYRINGE
INTRAMUSCULAR | Status: DC | PRN
  Administered 2020-05-09: 30 mg via INTRAVENOUS

## 2020-05-09 MED ORDER — PHENOL 1.4 % MT LIQD: 1.0000 | OROMUCOSAL | Status: DC | PRN

## 2020-05-09 MED ORDER — SODIUM CHLORIDE 0.9 % IV SOLN: INTRAVENOUS | Status: DC

## 2020-05-09 MED ORDER — SENNA 8.6 MG PO TABS: 1.0000 | ORAL_TABLET | Freq: Two times a day (BID) | ORAL | Status: DC

## 2020-05-09 MED ORDER — PHENYLEPHRINE HCL-NACL 10-0.9 MG/250ML-% IV SOLN
INTRAVENOUS | Status: DC | PRN
  Administered 2020-05-09: 75 ug/min via INTRAVENOUS

## 2020-05-09 MED ORDER — ONDANSETRON HCL 4 MG/2ML IJ SOLN: 4.0000 mg | Freq: Once | INTRAMUSCULAR | Status: DC | PRN

## 2020-05-09 MED ORDER — PHENYLEPHRINE 40 MCG/ML (10ML) SYRINGE FOR IV PUSH (FOR BLOOD PRESSURE SUPPORT)
PREFILLED_SYRINGE | INTRAVENOUS | Status: DC | PRN
  Administered 2020-05-09: 40 ug via INTRAVENOUS
  Administered 2020-05-09 (×4): 80 ug via INTRAVENOUS

## 2020-05-09 MED ORDER — LIDOCAINE-EPINEPHRINE 1 %-1:100000 IJ SOLN
INTRAMUSCULAR | Status: DC | PRN
  Administered 2020-05-09: 4 mL

## 2020-05-09 MED ORDER — OXYCODONE HCL 5 MG PO TABS
5.0000 mg | ORAL_TABLET | ORAL | Status: DC | PRN
Start: 2020-05-09 — End: 2020-05-09

## 2020-05-09 MED ORDER — BACITRACIN ZINC 500 UNIT/GM EX OINT
TOPICAL_OINTMENT | CUTANEOUS | Status: DC | PRN
  Administered 2020-05-09: 1 via TOPICAL

## 2020-05-09 MED ORDER — SODIUM CHLORIDE 0.9% FLUSH: 3.0000 mL | INTRAVENOUS | Status: DC | PRN

## 2020-05-09 MED ORDER — MUPIROCIN 2 % EX OINT
1.0000 "application " | TOPICAL_OINTMENT | Freq: Two times a day (BID) | CUTANEOUS | Status: AC
  Administered 2020-05-09 – 2020-05-13 (×9): 1 via NASAL
  Filled 2020-05-09 (×5): qty 22

## 2020-05-09 MED ORDER — ROCURONIUM BROMIDE 10 MG/ML (PF) SYRINGE
PREFILLED_SYRINGE | INTRAVENOUS | Status: AC
  Filled 2020-05-09: qty 10

## 2020-05-09 MED ORDER — MENTHOL 3 MG MT LOZG: 1.0000 | LOZENGE | OROMUCOSAL | Status: DC | PRN

## 2020-05-09 MED ORDER — BACITRACIN ZINC 500 UNIT/GM EX OINT
TOPICAL_OINTMENT | CUTANEOUS | Status: AC
  Filled 2020-05-09: qty 28.35

## 2020-05-09 MED ORDER — HYDROMORPHONE HCL 1 MG/ML IJ SOLN
INTRAMUSCULAR | Status: AC
  Filled 2020-05-09: qty 1

## 2020-05-09 MED ORDER — PROPOFOL 10 MG/ML IV BOLUS
INTRAVENOUS | Status: AC
  Filled 2020-05-09: qty 20

## 2020-05-09 MED ORDER — CHLORHEXIDINE GLUCONATE 0.12 % MT SOLN: 15.0000 mL | Freq: Once | OROMUCOSAL | Status: AC

## 2020-05-09 MED ORDER — METHOCARBAMOL 1000 MG/10ML IJ SOLN: 500.0000 mg | Freq: Four times a day (QID) | INTRAVENOUS | Status: DC | PRN

## 2020-05-09 MED ORDER — ONDANSETRON HCL 4 MG/2ML IJ SOLN: 4.0000 mg | Freq: Four times a day (QID) | INTRAMUSCULAR | Status: DC | PRN

## 2020-05-09 MED ORDER — GABAPENTIN 300 MG PO CAPS
600.0000 mg | ORAL_CAPSULE | Freq: Two times a day (BID) | ORAL | Status: DC
  Administered 2020-05-09 – 2020-05-14 (×10): 600 mg via ORAL
  Filled 2020-05-09 (×10): qty 2

## 2020-05-09 MED ORDER — SUGAMMADEX SODIUM 200 MG/2ML IV SOLN
INTRAVENOUS | Status: DC | PRN
  Administered 2020-05-09: 200 mg via INTRAVENOUS

## 2020-05-09 MED ORDER — ACETAMINOPHEN 10 MG/ML IV SOLN
1000.0000 mg | Freq: Four times a day (QID) | INTRAVENOUS | Status: DC
  Administered 2020-05-09: 19:00:00 1000 mg via INTRAVENOUS

## 2020-05-09 MED ORDER — MIDAZOLAM HCL 2 MG/2ML IJ SOLN
INTRAMUSCULAR | Status: AC
  Filled 2020-05-09: qty 2

## 2020-05-09 MED ORDER — SENNOSIDES-DOCUSATE SODIUM 8.6-50 MG PO TABS: 1.0000 | ORAL_TABLET | Freq: Every evening | ORAL | Status: DC | PRN

## 2020-05-09 MED ORDER — LACTATED RINGERS IV SOLN: INTRAVENOUS | Status: DC

## 2020-05-09 MED ORDER — LIDOCAINE-EPINEPHRINE 1 %-1:100000 IJ SOLN
INTRAMUSCULAR | Status: AC
  Filled 2020-05-09: qty 1

## 2020-05-09 MED ORDER — DOCUSATE SODIUM 100 MG PO CAPS: 100.0000 mg | ORAL_CAPSULE | Freq: Two times a day (BID) | ORAL | Status: DC

## 2020-05-09 MED ORDER — PROPOFOL 10 MG/ML IV BOLUS
INTRAVENOUS | Status: DC | PRN
  Administered 2020-05-09: 30 mg via INTRAVENOUS
  Administered 2020-05-09: 130 mg via INTRAVENOUS

## 2020-05-09 MED ORDER — CHLORHEXIDINE GLUCONATE 0.12 % MT SOLN
OROMUCOSAL | Status: AC
  Administered 2020-05-09: 13:00:00 15 mL via OROMUCOSAL
  Filled 2020-05-09: qty 15

## 2020-05-09 MED ORDER — ONDANSETRON HCL 4 MG PO TABS: 4.0000 mg | ORAL_TABLET | Freq: Four times a day (QID) | ORAL | Status: DC | PRN

## 2020-05-09 MED ORDER — THROMBIN 5000 UNITS EX SOLR
OROMUCOSAL | Status: DC | PRN
  Administered 2020-05-09 (×2): 5 mL via TOPICAL

## 2020-05-09 MED ORDER — ONDANSETRON HCL 4 MG/2ML IJ SOLN
INTRAMUSCULAR | Status: DC | PRN
  Administered 2020-05-09: 4 mg via INTRAVENOUS

## 2020-05-09 MED ORDER — CHLORHEXIDINE GLUCONATE CLOTH 2 % EX PADS: 6.0000 | MEDICATED_PAD | Freq: Every day | CUTANEOUS | Status: DC

## 2020-05-09 MED ORDER — HYDROCODONE-ACETAMINOPHEN 5-325 MG PO TABS: 1.0000 | ORAL_TABLET | ORAL | Status: DC | PRN

## 2020-05-09 MED ORDER — DEXAMETHASONE SODIUM PHOSPHATE 10 MG/ML IJ SOLN
INTRAMUSCULAR | Status: DC | PRN
  Administered 2020-05-09: 10 mg via INTRAVENOUS

## 2020-05-09 SURGICAL SUPPLY — 85 items
BAND RUBBER #7 7IN GRN STRL LF (MISCELLANEOUS) ×4 IMPLANT
BENZOIN TINCTURE PRP APPL 2/3 (GAUZE/BANDAGES/DRESSINGS) IMPLANT
BIT DRILL LONG 3.0X30 (BIT) IMPLANT
BIT DRILL LONG 3X80 (BIT) IMPLANT
BIT DRILL LONG 4X80 (BIT) IMPLANT
BIT DRILL SHORT 3.0X30 (BIT) ×2 IMPLANT
BIT DRILL SHORT 3X80 (BIT) IMPLANT
BLADE CLIPPER SURG (BLADE) IMPLANT
BLADE SURG 11 STRL SS (BLADE) ×4 IMPLANT
BUR MATCHSTICK NEURO 3.0 LAGG (BURR) ×2 IMPLANT
BUR PRECISION FLUTE 5.0 (BURR) ×2 IMPLANT
CANISTER SUCT 3000ML PPV (MISCELLANEOUS) ×2 IMPLANT
CARTRIDGE OIL MAESTRO DRILL (MISCELLANEOUS) ×1 IMPLANT
CNTNR URN SCR LID CUP LEK RST (MISCELLANEOUS) ×1 IMPLANT
CONT SPEC 4OZ STRL OR WHT (MISCELLANEOUS) ×2
COVER BACK TABLE 60X90IN (DRAPES) ×2 IMPLANT
COVER WAND RF STERILE (DRAPES) IMPLANT
DECANTER SPIKE VIAL GLASS SM (MISCELLANEOUS) ×4 IMPLANT
DERMABOND ADVANCED (GAUZE/BANDAGES/DRESSINGS)
DERMABOND ADVANCED .7 DNX12 (GAUZE/BANDAGES/DRESSINGS) IMPLANT
DIFFUSER DRILL AIR PNEUMATIC (MISCELLANEOUS) ×2 IMPLANT
DRAPE C-ARM 42X72 X-RAY (DRAPES) ×2 IMPLANT
DRAPE C-ARMOR (DRAPES) ×2 IMPLANT
DRAPE LAPAROTOMY 100X72X124 (DRAPES) ×2 IMPLANT
DRAPE MICROSCOPE LEICA 54X105 (DRAPES) ×2 IMPLANT
DRAPE SHEET LG 3/4 BI-LAMINATE (DRAPES) ×2 IMPLANT
DRAPE SURG 17X23 STRL (DRAPES) ×2 IMPLANT
DRSG OPSITE 4X5.5 SM (GAUZE/BANDAGES/DRESSINGS) ×2 IMPLANT
DRSG OPSITE POSTOP 4X8 (GAUZE/BANDAGES/DRESSINGS) ×2 IMPLANT
DURAPREP 26ML APPLICATOR (WOUND CARE) ×2 IMPLANT
ELECT REM PT RETURN 9FT ADLT (ELECTROSURGICAL) ×2
ELECTRODE REM PT RTRN 9FT ADLT (ELECTROSURGICAL) ×1 IMPLANT
EXTENDER TAB GUIDE SV 5.5/6.0 (INSTRUMENTS) ×24 IMPLANT
GAUZE 4X4 16PLY RFD (DISPOSABLE) IMPLANT
GAUZE SPONGE 4X4 12PLY STRL (GAUZE/BANDAGES/DRESSINGS) IMPLANT
GAUZE SPONGE 4X4 12PLY STRL LF (GAUZE/BANDAGES/DRESSINGS) ×2 IMPLANT
GLOVE BIO SURGEON STRL SZ7 (GLOVE) IMPLANT
GLOVE BIOGEL PI IND STRL 7.0 (GLOVE) IMPLANT
GLOVE BIOGEL PI IND STRL 7.5 (GLOVE) ×1 IMPLANT
GLOVE BIOGEL PI INDICATOR 7.0 (GLOVE)
GLOVE BIOGEL PI INDICATOR 7.5 (GLOVE) ×1
GLOVE ECLIPSE 7.0 STRL STRAW (GLOVE) ×2 IMPLANT
GLOVE EXAM NITRILE XL STR (GLOVE) IMPLANT
GOWN STRL REUS W/ TWL LRG LVL3 (GOWN DISPOSABLE) ×2 IMPLANT
GOWN STRL REUS W/ TWL XL LVL3 (GOWN DISPOSABLE) IMPLANT
GOWN STRL REUS W/TWL 2XL LVL3 (GOWN DISPOSABLE) IMPLANT
GOWN STRL REUS W/TWL LRG LVL3 (GOWN DISPOSABLE) ×4
GOWN STRL REUS W/TWL XL LVL3 (GOWN DISPOSABLE)
GRAFT DURAGEN MATRIX 1WX1L (Tissue) ×2 IMPLANT
GUIDEWIRE BLUNT NT 450 (WIRE) ×12 IMPLANT
HEMOSTAT POWDER KIT SURGIFOAM (HEMOSTASIS) ×4 IMPLANT
KIT BASIN OR (CUSTOM PROCEDURE TRAY) ×2 IMPLANT
KIT SPINE MAZOR X ROBO DISP (MISCELLANEOUS) ×2 IMPLANT
KIT TURNOVER KIT B (KITS) ×2 IMPLANT
NEEDLE HYPO 18GX1.5 BLUNT FILL (NEEDLE) IMPLANT
NEEDLE HYPO 25X1 1.5 SAFETY (NEEDLE) ×2 IMPLANT
NEEDLE SPNL 18GX3.5 QUINCKE PK (NEEDLE) ×2 IMPLANT
NEEDLE TROCAR PAK (NEEDLE) ×2 IMPLANT
NS IRRIG 1000ML POUR BTL (IV SOLUTION) ×2 IMPLANT
OIL CARTRIDGE MAESTRO DRILL (MISCELLANEOUS) ×2
PACK LAMINECTOMY NEURO (CUSTOM PROCEDURE TRAY) ×2 IMPLANT
PAD ARMBOARD 7.5X6 YLW CONV (MISCELLANEOUS) ×6 IMPLANT
PIN HEAD 2.5X60MM (PIN) IMPLANT
ROD PERC CCM 5.5X70 (Rod) ×4 IMPLANT
SCREW 6.5X40 VOYAGER MAS FNS (Screw) ×4 IMPLANT
SCREW MA FENS 4.5X40 (Screw) ×4 IMPLANT
SCREW MA FENS 5.5X40 (Screw) ×4 IMPLANT
SCREW SCHANZ SA 4.0MM (MISCELLANEOUS) ×2 IMPLANT
SCREW SET 5.5/6.0MM SOLERA (Screw) ×12 IMPLANT
SEALANT ADHERUS EXTEND TIP (MISCELLANEOUS) ×2 IMPLANT
SPONGE LAP 4X18 RFD (DISPOSABLE) IMPLANT
SPONGE SURGIFOAM ABS GEL 100 (HEMOSTASIS) IMPLANT
STAPLER VISISTAT 35W (STAPLE) ×2 IMPLANT
STRIP CLOSURE SKIN 1/2X4 (GAUZE/BANDAGES/DRESSINGS) IMPLANT
SUT VIC AB 0 CT1 18XCR BRD8 (SUTURE) ×3 IMPLANT
SUT VIC AB 0 CT1 8-18 (SUTURE) ×6
SUT VIC AB 3-0 SH 8-18 (SUTURE) ×4 IMPLANT
SUT VICRYL 3-0 RB1 18 ABS (SUTURE) ×4 IMPLANT
SYR 3ML LL SCALE MARK (SYRINGE) IMPLANT
TOWEL GREEN STERILE (TOWEL DISPOSABLE) ×2 IMPLANT
TOWEL GREEN STERILE FF (TOWEL DISPOSABLE) ×2 IMPLANT
TRAP SPECIMEN MUCUS 40CC (MISCELLANEOUS) ×2 IMPLANT
TRAY FOLEY MTR SLVR 16FR STAT (SET/KITS/TRAYS/PACK) ×2 IMPLANT
TUBE MAZOR SA REDUCTION (TUBING) ×2 IMPLANT
WATER STERILE IRR 1000ML POUR (IV SOLUTION) ×2 IMPLANT

## 2020-05-09 NOTE — Anesthesia Postprocedure Evaluation (Signed)
Anesthesia Post Note  Patient: Valerie Evans  Procedure(s) Performed: LAMINECTOMY WITH POSTERIOR LATERAL ARTHRODESIS  LUMBAR ONE- LUMBAR THREE (N/A ) APPLICATION OF ROBOTIC ASSISTANCE FOR SPINAL PROCEDURE (N/A )     Patient location during evaluation: PACU Anesthesia Type: General Level of consciousness: awake and alert Pain management: pain level controlled Vital Signs Assessment: post-procedure vital signs reviewed and stable Respiratory status: spontaneous breathing, nonlabored ventilation, respiratory function stable and patient connected to nasal cannula oxygen Cardiovascular status: blood pressure returned to baseline and stable Postop Assessment: no apparent nausea or vomiting Anesthetic complications: no   No complications documented.  Last Vitals:  Vitals:   05/09/20 1800 05/09/20 1815  BP: 105/74 (!) 108/58  Pulse: 97 96  Resp: 17 16  Temp:    SpO2: 100% 94%    Last Pain:  Vitals:   05/09/20 1815  TempSrc:   PainSc: Asleep                 Maribel Hadley COKER

## 2020-05-09 NOTE — Progress Notes (Signed)
  NEUROSURGERY PROGRESS NOTE   No issues overnight. Pt reports some N/T in anterior left thigh, otherwise no new complaints.  EXAM:  BP 112/77 (BP Location: Right Arm)   Pulse 86   Temp 98.2 F (36.8 C) (Oral)   Resp 18   Ht 5' (1.524 m)   Wt 47.6 kg   LMP 05/08/2020 (Exact Date)   SpO2 97%   BMI 20.51 kg/m   Awake, alert, oriented  Speech fluent, appropriate  CN grossly intact  Good strength BUE/BLE (pain limited LLE)  IMPRESSION:  35 y.o. female with unstable L2 burst fracture, distal tib/fib fx on left  PLAN: - Will proceed with L2 laminectomy, T12-L4 stabilization/fusion  I have again reviewed the situation and the need for surgery with the patient and her mother. All questions were answered and she provided verbal and written consent to proceed.   Lisbeth Renshaw, MD [facility]

## 2020-05-09 NOTE — Anesthesia Procedure Notes (Signed)
Procedure Name: Intubation Date/Time: 05/09/2020 1:57 PM Performed by: Myna Bright, CRNA Pre-anesthesia Checklist: Patient identified, Emergency Drugs available, Suction available and Patient being monitored Patient Re-evaluated:Patient Re-evaluated prior to induction Oxygen Delivery Method: Circle system utilized Preoxygenation: Pre-oxygenation with 100% oxygen Induction Type: IV induction Ventilation: Mask ventilation without difficulty Laryngoscope Size: Mac and 3 Grade View: Grade I Tube type: Oral Tube size: 7.0 mm Number of attempts: 1 Airway Equipment and Method: Stylet Placement Confirmation: ETT inserted through vocal cords under direct vision,  positive ETCO2 and breath sounds checked- equal and bilateral Secured at: 21 cm Tube secured with: Tape Dental Injury: Teeth and Oropharynx as per pre-operative assessment

## 2020-05-09 NOTE — Transfer of Care (Signed)
Immediate Anesthesia Transfer of Care Note  Patient: RHEANNA SERGENT  Procedure(s) Performed: LAMINECTOMY WITH POSTERIOR LATERAL ARTHRODESIS  LUMBAR ONE- LUMBAR THREE (N/A ) APPLICATION OF ROBOTIC ASSISTANCE FOR SPINAL PROCEDURE (N/A )  Patient Location: PACU  Anesthesia Type:General  Level of Consciousness: sedated, patient cooperative and responds to stimulation  Airway & Oxygen Therapy: Patient Spontanous Breathing and Patient connected to face mask oxygen  Post-op Assessment: Report given to RN, Post -op Vital signs reviewed and stable and Patient moving all extremities  Post vital signs: Reviewed and stable  Last Vitals:  Vitals Value Taken Time  BP 105/60 05/09/20 1727  Temp 36.2 C 05/09/20 1726  Pulse 98 05/09/20 1731  Resp 14 05/09/20 1731  SpO2 100 % 05/09/20 1731  Vitals shown include unvalidated device data.  Last Pain:  Vitals:   05/09/20 1203  TempSrc: Oral  PainSc:       Patients Stated Pain Goal: 2 (05/09/20 1121)  Complications: No complications documented.

## 2020-05-09 NOTE — Progress Notes (Signed)
Per PA Pt can travel to short stay without tele.  

## 2020-05-09 NOTE — Anesthesia Preprocedure Evaluation (Signed)
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
## 2020-05-10 LAB — BASIC METABOLIC PANEL
Anion gap: 8 (ref 5–15)
BUN: <5 mg/dL — ABNORMAL LOW (ref 6–20)
CO2: 24 mmol/L (ref 22–32)
Calcium: 8.2 mg/dL — ABNORMAL LOW (ref 8.9–10.3)
Chloride: 106 mmol/L (ref 98–111)
Creatinine, Ser: 0.7 mg/dL (ref 0.44–1.00)
GFR, Estimated: >60 mL/min (ref >60)
Glucose, Bld: 116 mg/dL — ABNORMAL HIGH (ref 70–99)
Potassium: 4 mmol/L (ref 3.5–5.1)
Sodium: 138 mmol/L (ref 135–145)

## 2020-05-10 LAB — CBC
HCT: 26.6 % — ABNORMAL LOW (ref 36.0–46.0)
Hemoglobin: 8.7 g/dL — ABNORMAL LOW (ref 12.0–15.0)
MCH: 31.1 pg (ref 26.0–34.0)
MCHC: 32.7 g/dL (ref 30.0–36.0)
MCV: 95 fL (ref 80.0–100.0)
Platelets: 190 10*3/uL (ref 150–400)
RBC: 2.8 MIL/uL — ABNORMAL LOW (ref 3.87–5.11)
RDW: 11.4 % — ABNORMAL LOW (ref 11.5–15.5)
WBC: 8.4 10*3/uL (ref 4.0–10.5)
nRBC: 0 % (ref 0.0–0.2)

## 2020-05-10 LAB — PROTIME-INR
INR: 1.2 (ref 0.8–1.2)
Prothrombin Time: 14.5 seconds (ref 11.4–15.2)

## 2020-05-10 LAB — APTT: aPTT: 38 seconds — ABNORMAL HIGH (ref 24–36)

## 2020-05-10 MED ORDER — HEPARIN SODIUM (PORCINE) 5000 UNIT/ML IJ SOLN
5000.0000 [IU] | Freq: Three times a day (TID) | INTRAMUSCULAR | Status: DC
  Administered 2020-05-10 – 2020-05-14 (×12): 5000 [IU] via SUBCUTANEOUS
  Filled 2020-05-10 (×12): qty 1

## 2020-05-10 MED FILL — Thrombin For Soln 5000 Unit: CUTANEOUS | Qty: 5000 | Status: AC

## 2020-05-10 NOTE — Progress Notes (Signed)
   05/09/20 2320  Assess: MEWS Score  Temp 98.9 F (37.2 C)  BP 95/69  Pulse Rate 100  ECG Heart Rate (!) 109  Resp 20  Level of Consciousness Alert  SpO2 100 %  O2 Device Nasal Cannula  O2 Flow Rate (L/min) 2 L/min  Assess: MEWS Score  MEWS Temp 0  MEWS Systolic 1  MEWS Pulse 1  MEWS RR 0  MEWS LOC 0  MEWS Score 2  MEWS Score Color Yellow  Assess: if the MEWS score is Yellow or Red  Were vital signs taken at a resting state? Yes  Focused Assessment No change from prior assessment  Early Detection of Sepsis Score *See Row Information* Low  MEWS guidelines implemented *See Row Information* Yes  Treat  MEWS Interventions Other (Comment) (none given at this time)  Take Vital Signs  Increase Vital Sign Frequency  Yellow: Q 2hr X 2 then Q 4hr X 2, if remains yellow, continue Q 4hrs  Escalate  MEWS: Escalate Yellow: discuss with charge nurse/RN and consider discussing with provider and RRT  Notify: Charge Nurse/RN  Name of Charge Nurse/RN Notified Geannie Risen, RN  Date Charge Nurse/RN Notified 05/09/20  Time Charge Nurse/RN Notified 2320  Document  Patient Outcome Other (Comment) (no intervention needed)  Progress note created (see row info) Yes
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
## 2020-05-10 NOTE — Progress Notes (Addendum)
Received report from Tam in the PACU; RN had already given husband update in waiting area.  Husband upset about visitation policy and procedures, asked to speak with A/C.  Patient was placed on telemetry and was currently wearing 2L [place].  Pain issues were addressed and pt given gingerale.  Patient had scant drainage at the bottom of honeycomb dressing, documented and circled. Husband allowed to visit for 30 minutes since pt just returned from OR; Pt spoke with A/C via telephone.  Pt and family had no other requests at current time.

## 2020-05-10 NOTE — Progress Notes (Addendum)
Patient's oxygen level dipping after giving nighttime medications.  Patient still rating pain 9 out of 10.  Oxygen increased to 3 liters [place] and patient given IS.  IS used 5 times and pt reached 750 on the scale.  Patient appears to be resting in bed with no movement.  Pt readjusted and nonpharmalogical measures used to decrease pain at this time until oxygen level stabilizes.  Will continue to monitor patient for pain control.  Will give tylenol PRN at this time to help with inflammation and offer heat/cold options.  Pt appears to be in no acute distress upon examination.  Pt is anxious and tells me she does suffer from PTSD.
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
## 2020-05-10 NOTE — Progress Notes (Signed)
  NEUROSURGERY PROGRESS NOTE   No issues overnight. C/o back soreness, unchanged tingling in L thigh. No new complaints.  EXAM:  BP (!) 91/49 (BP Location: Left Arm)   Pulse 100   Temp 99 F (37.2 C) (Oral)   Resp 15   Ht 5' (1.524 m)   Wt 47.6 kg   LMP 05/08/2020 (Exact Date)   SpO2 100%   BMI 20.51 kg/m   Awake, alert, oriented  Speech fluent, appropriate  CN grossly intact  5/5 BUE/BLE, left LLE exam limited by pain, splint.  IMPRESSION:  34 y.o. female POD#1 L2 decompression, L1-L3 stabilization for unstable burst. Doing well Distal left tibial fx  PLAN: - Will clarify with ortho WB status and f/u of left tib fx - Mobilize with PT/OT  - routine postop care including IS  - Can start sub-q heparin today

## 2020-05-10 NOTE — Progress Notes (Signed)
   05/10/20 0300  Assess: MEWS Score  Temp 98.8 F (37.1 C)  BP (!) 86/55  Pulse Rate 89  ECG Heart Rate 87  Resp 11  Level of Consciousness Alert  SpO2 99 %  O2 Device Nasal Cannula  O2 Flow Rate (L/min) 2 L/min  Assess: MEWS Score  MEWS Temp 0  MEWS Systolic 1  MEWS Pulse 0  MEWS RR 1  MEWS LOC 0  MEWS Score 2  MEWS Score Color Yellow  Assess: if the MEWS score is Yellow or Red  Were vital signs taken at a resting state? Yes  Focused Assessment No change from prior assessment  Early Detection of Sepsis Score *See Row Information* Low  MEWS guidelines implemented *See Row Information* Yes  Treat  Pain Scale 0-10  Pain Score Asleep  Take Vital Signs  Increase Vital Sign Frequency  Yellow: Q 2hr X 2 then Q 4hr X 2, if remains yellow, continue Q 4hrs  Escalate  MEWS: Escalate Yellow: discuss with charge nurse/RN and consider discussing with provider and RRT  Notify: Charge Nurse/RN  Name of Charge Nurse/RN Notified Geannie Risen, RN  Date Charge Nurse/RN Notified 05/10/20  Time Charge Nurse/RN Notified 0300  Document  Patient Outcome Other (Comment) (reconnected continous fluids)  Progress note created (see row info) Yes

## 2020-05-10 NOTE — Progress Notes (Signed)
Went to reassess patient and patient oxygen still dropping despite being titrated to 5Liters [place].  Patient states she is having high anxiety and not breathing deeply because of this.  When pt is encouraged to take deep breaths and assisted by the RN her oxygen stats do improve to 95%. Patient states she does take 1mg  of Klonopin 3 times a day at home.  RN checked PTA list and it is verified.  RN will page on call for orders at this time.

## 2020-05-11 ENCOUNTER — Encounter (HOSPITAL_COMMUNITY): Payer: Self-pay | Admitting: Neurosurgery

## 2020-05-11 MED ORDER — CLONAZEPAM 1 MG PO TABS
1.0000 mg | ORAL_TABLET | Freq: Three times a day (TID) | ORAL | Status: DC | PRN
Start: 2020-05-11 — End: 2020-05-14
  Administered 2020-05-11 – 2020-05-14 (×7): 1 mg via ORAL
  Filled 2020-05-11 (×8): qty 1
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
## 2020-05-11 NOTE — Progress Notes (Signed)
Paged on call twice Dr. Jake Samples but no response at this time;  1 mg of Dilaudid was given and pt satisfied for about 30 minutes while texting and laying in bed watching tv.  After that patient reports pain has increased again but oxygenation has dropped to 80's and patient having another attack of anxiety.  Patient was placed on a nonrebreather 15L to suppliment her oxygen until anxiety resides.  Pt is currently stating at 95%, once oxygenation level stabilizes, more medication will be administered when due.  RR are 11 on monitor and manually counted 12.

## 2020-05-11 NOTE — Progress Notes (Signed)
  NEUROSURGERY PROGRESS NOTE   Panic attack overnight. Takes klonopin as needed. Complains of moderate back soreness today No N/T/W  EXAM:  BP 99/64 (BP Location: Left Arm)   Pulse 96   Temp 98.7 F (37.1 C) (Oral)   Resp 19   Ht 5' (1.524 m)   Wt 47.6 kg   LMP 05/08/2020 (Exact Date)   SpO2 93%   BMI 20.51 kg/m   Awake, alert, oriented  Speech fluent, appropriate  CN grossly intact  5/5 BUE/BLE  Incision: c/d/i  IMPRESSION/PLAN 34 y.o. female POD#2 L2 decompression, L1-L3 stabilization for unstable burst. Doing well Distal left tibial fx - continue to progress through post operative phase - PT/OT

## 2020-05-11 NOTE — Progress Notes (Signed)
   05/11/20 0000  Assess: MEWS Score  Temp 98.7 F (37.1 C)  BP (!) 100/59  Pulse Rate (!) 109  ECG Heart Rate (!) 111  Resp 10  Level of Consciousness Alert  SpO2 91 %  O2 Device Nasal Cannula  O2 Flow Rate (L/min) 5 L/min  Assess: MEWS Score  MEWS Temp 0  MEWS Systolic 1  MEWS Pulse 2  MEWS RR 1  MEWS LOC 0  MEWS Score 4  MEWS Score Color Red  Assess: if the MEWS score is Yellow or Red  Were vital signs taken at a resting state? Yes  Focused Assessment No change from prior assessment  Early Detection of Sepsis Score *See Row Information* Low  MEWS guidelines implemented *See Row Information* No, previously red, continue vital signs every 4 hours  Treat  Pain Scale 0-10  Pain Score 7  Pain Type Surgical pain  Pain Location Back  Pain Orientation Mid;Lower  Pain Descriptors / Indicators Pressure;Throbbing  Pain Frequency Constant  Pain Onset On-going  Patients Stated Pain Goal 2  Pain Intervention(s) Emotional support  Take Vital Signs  Increase Vital Sign Frequency  Red: Q 1hr X 4 then Q 4hr X 4, if remains red, continue Q 4hrs  Escalate  MEWS: Escalate Red: discuss with charge nurse/RN and provider, consider discussing with RRT  Notify: Charge Nurse/RN  Name of Charge Nurse/RN Notified Geannie Risen, RN  Date Charge Nurse/RN Notified 05/11/20  Time Charge Nurse/RN Notified 0020  Notify: Provider  Provider Name/Title Dr. Jake Samples  Date Provider Notified 05/11/20  Time Provider Notified [phone]  Notification Type Page  Notification Reason Change in status  Response Other (Comment) (no response; paged answering service x 2)  Document  Patient Outcome Other (Comment) (no change; see progress notes)

## 2020-05-11 NOTE — Progress Notes (Signed)
   05/11/20 0500  Assess: MEWS Score  Temp 98.7 F (37.1 C)  BP (!) 94/49  Pulse Rate (!) 101  ECG Heart Rate (!) 101  Resp 20  SpO2 100 %  O2 Device Nasal Cannula  Heater temperature 35.6 F (2 C)  Assess: MEWS Score  MEWS Temp 0  MEWS Systolic 1  MEWS Pulse 1  MEWS RR 0  MEWS LOC 0  MEWS Score 2  MEWS Score Color Yellow  Assess: if the MEWS score is Yellow or Red  Were vital signs taken at a resting state? Yes  Focused Assessment No change from prior assessment  Early Detection of Sepsis Score *See Row Information* Low  MEWS guidelines implemented *See Row Information* Yes  Treat  Pain Scale 0-10  Pain Score Asleep  Take Vital Signs  Increase Vital Sign Frequency  Yellow: Q 2hr X 2 then Q 4hr X 2, if remains yellow, continue Q 4hrs  Escalate  MEWS: Escalate Yellow: discuss with charge nurse/RN and consider discussing with provider and RRT  Notify: Charge Nurse/RN  Name of Charge Nurse/RN Notified Geannie Risen, RN  Date Charge Nurse/RN Notified 05/11/20  Time Charge Nurse/RN Notified 0501  Document  Patient Outcome  (no intervention needed)  Progress note created (see row info) Yes

## 2020-05-11 NOTE — Progress Notes (Signed)
   05/10/20 2007  Assess: MEWS Score  Temp 98.7 F (37.1 C)  BP 105/60  Pulse Rate (!) 110  ECG Heart Rate (!) 111  Resp 18  Level of Consciousness Alert  SpO2 100 %  O2 Device Nasal Cannula  O2 Flow Rate (L/min) 2 L/min  Assess: MEWS Score  MEWS Temp 0  MEWS Systolic 0  MEWS Pulse 2  MEWS RR 0  MEWS LOC 0  MEWS Score 2  MEWS Score Color Yellow  Assess: if the MEWS score is Yellow or Red  Were vital signs taken at a resting state? Yes  Focused Assessment No change from prior assessment  Early Detection of Sepsis Score *See Row Information* Low  MEWS guidelines implemented *See Row Information* Yes  Treat  Neuro symptoms relieved by Rest  Take Vital Signs  Increase Vital Sign Frequency  Yellow: Q 2hr X 2 then Q 4hr X 2, if remains yellow, continue Q 4hrs  Escalate  MEWS: Escalate Yellow: discuss with charge nurse/RN and consider discussing with provider and RRT  Notify: Charge Nurse/RN  Name of Charge Nurse/RN Notified Geannie Risen, RN  Date Charge Nurse/RN Notified 05/10/20  Time Charge Nurse/RN Notified 2000  Document  Patient Outcome Stabilized after interventions (10/10 pain addressed)  Progress note created (see row info) Yes

## 2020-05-12 NOTE — Progress Notes (Signed)
Overall stable.  Pain control remains difficult.  Awake and alert.  Oriented and appropriate.  Good strength on both sides.  Still with a little bit of numbness and tingling in her left lateral leg.  Dressing clean and dry.  Abdomen soft.  Overall progressing well.  Continue efforts at mobilization and pain control.

## 2020-05-12 NOTE — Progress Notes (Signed)
OT Cancellation Note  Patient Details Name: JEWELZ RICKLEFS MRN: [id number] DOB: August 20, 1985   Cancelled Treatment:    Reason Eval/Treat Not Completed: Other (comment); pt sleeping soundly upon arrival to room. Arouses and pleasant however unable to remain awake, falling asleep mid conversation. Will follow up for OT treatment as able.  Marcy Siren, OT Acute Rehabilitation Services Pager [phone] Office [phone]   Orlando Penner 05/12/2020, 4:00 PM
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
## 2020-05-13 MED ORDER — POLYETHYLENE GLYCOL 3350 17 G PO PACK
17.0000 g | PACK | Freq: Every day | ORAL | Status: DC
  Administered 2020-05-13 – 2020-05-14 (×2): 17 g via ORAL
  Filled 2020-05-13 (×2): qty 1

## 2020-05-13 NOTE — Progress Notes (Signed)
Neurosurgery Service Progress Note  Subjective: No acute events overnight, pain improving, no BM   Objective: Vitals:   05/13/20 0000 05/13/20 0321 05/13/20 0700 05/13/20 1108  BP:  104/66 96/61 101/67  Pulse:  (!) 109 (!) 104 (!) 103  Resp:  20 13 13   Temp: 98 F (36.7 C) 98.6 F (37 C) 99.1 F (37.3 C) 98.2 F (36.8 C)  TempSrc: Oral Oral Oral Oral  SpO2:   92% 95%  Weight:      Height:        Physical Exam: Strength 5/5 w/ intact sensation except limited exam in LLE due to cast  Assessment & Plan: 34 y.o. woman s/p lami/fusion for burst frx.  -add miralax to bowel regimen -if she has a BM then likely home tomorrow -can d/c tele -SCDs/TEDs/SQH  32  05/13/20 12:52 PM

## 2020-05-14 MED ORDER — METHOCARBAMOL 500 MG PO TABS: 500.0000 mg | ORAL_TABLET | Freq: Three times a day (TID) | ORAL | 0 refills | Status: DC | PRN

## 2020-05-14 MED ORDER — OXYCODONE HCL 5 MG PO TABS: 5.0000 mg | ORAL_TABLET | ORAL | 0 refills | Status: AC | PRN

## 2020-05-14 NOTE — Progress Notes (Signed)
  NEUROSURGERY PROGRESS NOTE   No issues overnight. No new complaints this am.  EXAM:  BP 102/64 (BP Location: Left Arm)   Pulse 88   Temp 97.9 F (36.6 C) (Oral)   Resp 14   Ht 5' (1.524 m)   Wt 47.6 kg   LMP 05/08/2020 (Exact Date)   SpO2 96%   BMI 20.51 kg/m   Awake, alert, oriented  Speech fluent, appropriate  CN grossly intact  5/5 BUE/BLE x LLE casted  IMPRESSION:  34 y.o. female s/p decompression/stabilization L2 burst fracture, recovering as expected Distal left tibial fx  PLAN: - Can d/c home today w/ HHPT/OT - F/U in my office in 2 weeks - Ortho f/u   Lisbeth Renshaw, MD [facility]

## 2020-05-14 NOTE — Care Management (Signed)
    Durable Medical Equipment  (From admission, onward)         Start     Ordered   05/14/20 1153  For home use only DME lightweight manual wheelchair with seat cushion  Once       Comments: Patient suffers from lumbar burst fracture which impairs their ability to perform daily activities like ambulating in the home.  A rolling walker will not resolve  issue with performing activities of daily living. A wheelchair will allow patient to safely perform daily activities. Patient is not able to propel themselves in the home using a standard weight wheelchair due to pain. Patient can self propel in the lightweight wheelchair. Length of need: 6 months. Accessories: elevating leg rests (ELRs), wheel locks, extensions and anti-tippers.   05/14/20 1154   05/14/20 1152  For home use only DME Tub bench  Once        05/14/20 1154   05/14/20 1150  For home use only DME Walker rolling  Once       Question Answer Comment  Walker: With 5 Inch Wheels   Patient needs a walker to treat with the following condition Lumbar burst fracture (HCC)      05/14/20 1154   05/09/20 2241  DME 3 n 1  Once        05/09/20 2240          Quintella Baton, RN, BSN  Trauma/Neuro ICU Case Manager [phone]
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
## 2020-05-15 ENCOUNTER — Encounter (HOSPITAL_COMMUNITY): Admission: RE | Admit: 2020-05-15 | Payer: 59 | Source: Ambulatory Visit

## 2020-05-15 ENCOUNTER — Encounter (HOSPITAL_COMMUNITY): Payer: Self-pay

## 2020-05-23 DIAGNOSIS — M545 Low back pain, unspecified: Secondary | ICD-10-CM | POA: Insufficient documentation

## 2020-05-28 ENCOUNTER — Encounter: Payer: Self-pay | Admitting: Physician Assistant

## 2020-05-28 ENCOUNTER — Telehealth (INDEPENDENT_AMBULATORY_CARE_PROVIDER_SITE_OTHER): Payer: 59 | Admitting: Physician Assistant

## 2020-05-28 DIAGNOSIS — F411 Generalized anxiety disorder: Secondary | ICD-10-CM | POA: Diagnosis not present

## 2020-05-28 DIAGNOSIS — F900 Attention-deficit hyperactivity disorder, predominantly inattentive type: Secondary | ICD-10-CM

## 2020-05-28 DIAGNOSIS — F4310 Post-traumatic stress disorder, unspecified: Secondary | ICD-10-CM

## 2020-05-28 DIAGNOSIS — F331 Major depressive disorder, recurrent, moderate: Secondary | ICD-10-CM

## 2020-05-28 MED ORDER — CLONAZEPAM 1 MG PO TABS: 1.0000 mg | ORAL_TABLET | Freq: Three times a day (TID) | ORAL | 5 refills | Status: DC

## 2020-05-28 MED ORDER — GABAPENTIN 600 MG PO TABS: 600.0000 mg | ORAL_TABLET | Freq: Two times a day (BID) | ORAL | 1 refills | Status: DC

## 2020-05-28 MED ORDER — BUPROPION HCL ER (XL) 150 MG PO TB24: 150.0000 mg | ORAL_TABLET | Freq: Every day | ORAL | 1 refills | Status: DC
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
## 2020-05-30 ENCOUNTER — Telehealth: Payer: Self-pay | Admitting: Physician Assistant

## 2020-05-30 ENCOUNTER — Other Ambulatory Visit: Payer: Self-pay | Admitting: Physician Assistant

## 2020-05-30 MED ORDER — QUETIAPINE FUMARATE 50 MG PO TABS: ORAL_TABLET | ORAL | 1 refills | Status: DC

## 2020-05-30 NOTE — Telephone Encounter (Signed)
Valerie Evans called to let you know she checked with her orthopedist and was told that it is ok for her to start the Seroquil. Please call in to the [facility] in [place], [place]. Phone # is [phone].

## 2020-05-30 NOTE — Telephone Encounter (Signed)
Prescription for Seroquel was sent in.

## 2020-06-20 ENCOUNTER — Encounter: Payer: Self-pay | Admitting: Physician Assistant

## 2020-06-20 NOTE — Telephone Encounter (Signed)
Can we get her set up for a video visit. I would need to have OV with her in order to put in new home health orders and have them covered by insurance. She can also reach out to her orthopedist/surgeon who should be able to place these orders as well.

## 2020-06-27 ENCOUNTER — Other Ambulatory Visit: Payer: Self-pay | Admitting: Physician Assistant

## 2020-06-30 ENCOUNTER — Other Ambulatory Visit: Payer: Self-pay | Admitting: Physician Assistant

## 2020-07-12 ENCOUNTER — Encounter: Payer: Self-pay | Admitting: Physician Assistant

## 2020-07-12 ENCOUNTER — Telehealth (INDEPENDENT_AMBULATORY_CARE_PROVIDER_SITE_OTHER): Payer: 59 | Admitting: Physician Assistant

## 2020-07-12 DIAGNOSIS — F900 Attention-deficit hyperactivity disorder, predominantly inattentive type: Secondary | ICD-10-CM

## 2020-07-12 DIAGNOSIS — F410 Panic disorder [episodic paroxysmal anxiety] without agoraphobia: Secondary | ICD-10-CM

## 2020-07-12 DIAGNOSIS — F331 Major depressive disorder, recurrent, moderate: Secondary | ICD-10-CM | POA: Diagnosis not present

## 2020-07-12 DIAGNOSIS — F4310 Post-traumatic stress disorder, unspecified: Secondary | ICD-10-CM

## 2020-07-12 DIAGNOSIS — F411 Generalized anxiety disorder: Secondary | ICD-10-CM

## 2020-07-12 DIAGNOSIS — F430 Acute stress reaction: Secondary | ICD-10-CM

## 2020-07-12 MED ORDER — QUETIAPINE FUMARATE 200 MG PO TABS
200.0000 mg | ORAL_TABLET | Freq: Every day | ORAL | 0 refills | Status: DC
Start: 2020-07-12 — End: 2020-09-21

## 2020-07-12 MED ORDER — GABAPENTIN 600 MG PO TABS: 600.0000 mg | ORAL_TABLET | Freq: Three times a day (TID) | ORAL | 1 refills | Status: DC
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
## 2020-07-21 ENCOUNTER — Encounter: Payer: Self-pay | Admitting: Physician Assistant

## 2020-07-24 ENCOUNTER — Telehealth: Payer: Self-pay | Admitting: Physician Assistant

## 2020-07-24 ENCOUNTER — Other Ambulatory Visit: Payer: Self-pay | Admitting: Physician Assistant

## 2020-07-24 MED ORDER — BUPROPION HCL ER (XL) 150 MG PO TB24: 450.0000 mg | ORAL_TABLET | Freq: Every day | ORAL | 1 refills | Status: DC

## 2020-07-24 NOTE — Telephone Encounter (Signed)
Pharmacy called and needs a refill on her wellbutrin 300 mg to be sent to them. Pt is out

## 2020-07-24 NOTE — Telephone Encounter (Signed)
Prescription was sent

## 2020-09-07 ENCOUNTER — Telehealth: Payer: 59 | Admitting: Physician Assistant

## 2020-09-07 DIAGNOSIS — J4540 Moderate persistent asthma, uncomplicated: Secondary | ICD-10-CM

## 2020-09-07 MED ORDER — ALBUTEROL SULFATE HFA 108 (90 BASE) MCG/ACT IN AERS: 1.0000 | INHALATION_SPRAY | Freq: Four times a day (QID) | RESPIRATORY_TRACT | 0 refills | Status: DC | PRN

## 2020-09-07 NOTE — Progress Notes (Signed)
Visit for Asthma  Based on what you have shared with me, it looks like you may have a flare up of your asthma.  Asthma is a chronic (ongoing) lung disease which results in airway obstruction, inflammation and hyper-responsiveness.   Asthma symptoms vary from person to person, with common symptoms including nighttime awakening and decreased ability to participate in normal activities as a result of shortness of breath. It is often triggered by changes in weather, changes in the season, changes in air temperature, or inside (home, school, daycare or work) allergens such as animal dander, mold, mildew, woodstoves or cockroaches.   It can also be triggered by hormonal changes, extreme emotion, physical exertion or an upper respiratory tract illness.     It is important to identify the trigger, and then eliminate or avoid the trigger if possible.   If you have been prescribed medications to be taken on a regular basis, it is important to follow the asthma action plan and to follow guidelines to adjust medication in response to increasing symptoms of decreased peak expiratory flow rate  Treatment: I have prescribed: Albuterol (Proventil HFA; Ventolin HFA) 108 (90 Base) MCG/ACT Inhaler 2 puffs into the lungs every six hours as needed for wheezing or shortness of breath  HOME CARE . Only take medications as instructed by your medical team. . Consider wearing a mask or scarf to improve breathing air temperature have been shown to decrease irritation and decrease exacerbations . Get rest. . Taking a steamy shower or using a humidifier may help nasal congestion sand ease sore throat pain. You can place a towel over your head and breathe in the steam from hot water coming from a faucet. . Using a saline nasal spray works much the same way.  . Cough drops, hare candies and sore throat lozenges may  ease your cough.  . Avoid close contacts especially the very you and the elderly . Cover your mouth if you cough or sneeze . Always remember to wash your hands.    GET HELP RIGHT AWAY IF: . You develop worsening symptoms; breathlessness at rest, drowsy, confused or agitated, unable to speak in full sentences . You have coughing fits . You develop a severe headache or visual changes . You develop shortness of breath, difficulty breathing or start having chest pain . Your symptoms persist after you have completed your treatment plan . If your symptoms do not improve within 10 days  MAKE SURE YOU . Understand these instructions. . Will watch your condition. . Will get help right away if you are not doing well or get worse.   Your e-visit answers were reviewed by a board certified advanced clinical practitioner to complete your personal care plan, Depending upon the condition, your plan could have included both over the counter or prescription medications.  Please review your pharmacy choice. Your safety is important to [place]. If you have drug allergies check your prescription carefully. You can use MyChart to ask questions about today's visit, request a non-urgent call back, or ask for a work or school excuse for 24 hours related to this e-Visit. If it has been greater than 24 hours you will need to follow up with your provider, or enter a new e-Visit to address those concerns.  You will get an e-mail in the next two days asking about your experience. I hope that your e-visit has been valuable and will speed your recovery. Thank you for using e-visits.  I provided 6 minutes of non  face-to-face time during this encounter for chart review and documentation.

## 2020-09-21 ENCOUNTER — Ambulatory Visit (INDEPENDENT_AMBULATORY_CARE_PROVIDER_SITE_OTHER): Payer: 59 | Admitting: Physician Assistant

## 2020-09-21 ENCOUNTER — Encounter: Payer: Self-pay | Admitting: Physician Assistant

## 2020-09-21 ENCOUNTER — Other Ambulatory Visit: Payer: Self-pay

## 2020-09-21 DIAGNOSIS — F332 Major depressive disorder, recurrent severe without psychotic features: Secondary | ICD-10-CM | POA: Diagnosis not present

## 2020-09-21 DIAGNOSIS — F4310 Post-traumatic stress disorder, unspecified: Secondary | ICD-10-CM

## 2020-09-21 DIAGNOSIS — F900 Attention-deficit hyperactivity disorder, predominantly inattentive type: Secondary | ICD-10-CM

## 2020-09-21 DIAGNOSIS — F411 Generalized anxiety disorder: Secondary | ICD-10-CM

## 2020-09-21 MED ORDER — QUETIAPINE FUMARATE 300 MG PO TABS
300.0000 mg | ORAL_TABLET | Freq: Every day | ORAL | 1 refills | Status: DC
Start: 2020-09-21 — End: 2020-10-31

## 2020-09-21 MED ORDER — TRAZODONE HCL 100 MG PO TABS: 100.0000 mg | ORAL_TABLET | Freq: Every evening | ORAL | 1 refills | Status: DC | PRN

## 2020-09-21 MED ORDER — VORTIOXETINE HBR 10 MG PO TABS
10.0000 mg | ORAL_TABLET | Freq: Every day | ORAL | 1 refills | Status: DC
Start: 2020-09-21 — End: 2020-11-19
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
## 2020-10-01 ENCOUNTER — Telehealth: Payer: Self-pay | Admitting: Physician Assistant

## 2020-10-01 ENCOUNTER — Other Ambulatory Visit: Payer: Self-pay | Admitting: Physician Assistant

## 2020-10-01 ENCOUNTER — Telehealth: Payer: 59 | Admitting: Physician Assistant

## 2020-10-01 DIAGNOSIS — J4540 Moderate persistent asthma, uncomplicated: Secondary | ICD-10-CM

## 2020-10-01 MED ORDER — ALBUTEROL SULFATE HFA 108 (90 BASE) MCG/ACT IN AERS: 1.0000 | INHALATION_SPRAY | Freq: Four times a day (QID) | RESPIRATORY_TRACT | 0 refills | Status: DC | PRN

## 2020-10-01 NOTE — Telephone Encounter (Signed)
Next visit is 11/19/20. [facility] is wanting to know which one of the two Wellbutrin prescriptions they need to fill? 1st script for three tablets of 150 mg or 2nd script of one 300 mg and one 150 mg? The pharmacy is:  [id number] - [place], [place] - [address] [place] [address] [place] [address] [place] [address] Phone:  [phone]  Fax:  [phone]

## 2020-10-01 NOTE — Telephone Encounter (Signed)
Spoke to pharmacy and clarified 

## 2020-10-01 NOTE — Progress Notes (Signed)
Visit for Asthma  Based on what you have shared with me, it looks like you may have a flare up of your asthma.  Asthma is a chronic (ongoing) lung disease which results in airway obstruction, inflammation and hyper-responsiveness.   Asthma symptoms vary from person to person, with common symptoms including nighttime awakening and decreased ability to participate in normal activities as a result of shortness of breath. It is often triggered by changes in weather, changes in the season, changes in air temperature, or inside (home, school, daycare or work) allergens such as animal dander, mold, mildew, woodstoves or cockroaches.   It can also be triggered by hormonal changes, extreme emotion, physical exertion or an upper respiratory tract illness.     It is important to identify the trigger, and then eliminate or avoid the trigger if possible.   If you have been prescribed medications to be taken on a regular basis, it is important to follow the asthma action plan and to follow guidelines to adjust medication in response to increasing symptoms of decreased peak expiratory flow rate  Treatment: I have prescribed: Albuterol (Proventil HFA; Ventolin HFA) 108 (90 Base) MCG/ACT Inhaler 2 puffs into the lungs every six hours as needed for wheezing or shortness of breath  HOME CARE Only take medications as instructed by your medical team. Consider wearing a mask or scarf to improve breathing air temperature have been shown to decrease irritation and decrease exacerbations Get rest. Taking a steamy shower or using a humidifier may help nasal congestion sand ease sore throat pain. You can place a towel over your head and breathe in the steam from hot water coming from a faucet. Using a saline nasal spray works much the same way.  Cough drops, hare candies and sore throat lozenges may ease your  cough.  Avoid close contacts especially the very you and the elderly Cover your mouth if you cough or sneeze Always remember to wash your hands.    GET HELP RIGHT AWAY IF: You develop worsening symptoms; breathlessness at rest, drowsy, confused or agitated, unable to speak in full sentences You have coughing fits You develop a severe headache or visual changes You develop shortness of breath, difficulty breathing or start having chest pain Your symptoms persist after you have completed your treatment plan If your symptoms do not improve within 10 days  MAKE SURE YOU Understand these instructions. Will watch your condition. Will get help right away if you are not doing well or get worse.   Your e-visit answers were reviewed by a board certified advanced clinical practitioner to complete your personal care plan, Depending upon the condition, your plan could have included both over the counter or prescription medications.   Please review your pharmacy choice. Your safety is important to us. If you have drug allergies check your prescription carefully.  You can use MyChart to ask questions about today's visit, request a non-urgent  call back, or ask for a work or school excuse for 24 hours related to this e-Visit. If it has been greater than 24 hours you will need to follow up with your provider, or enter a new e-Visit to address those concerns.   You will get an e-mail in the next two days asking about your experience. I hope that your e-visit has been valuable and will speed your recovery. Thank you for using e-visits.  I provided 5 minutes of non face-to-face time during this encounter for chart review and documentation.   

## 2020-10-08 ENCOUNTER — Telehealth: Payer: Self-pay

## 2020-10-08 NOTE — Telephone Encounter (Signed)
Prior Authorization submitted for TRINTELLIX 10 mg with [facility] ID# [id number].

## 2020-10-09 NOTE — Telephone Encounter (Signed)
Please let her know of this denial and we have to try Viibryd first. I'll send in Rx, which pharmacy? Thanks

## 2020-10-09 NOTE — Telephone Encounter (Signed)
Prior Authorization for TRINTELLIX 10 MG has been DENIED with [employer] due to not trying and or failing VIIBRYD.    A documented trial and therapeutic failure (drug did not work), contraindication (harmful for), or intolerance (side effect) to the following: 1. Viibryd.    Rosey Bath will be updated with information and how to proceed.

## 2020-10-11 ENCOUNTER — Other Ambulatory Visit: Payer: Self-pay | Admitting: Physician Assistant
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
## 2020-10-12 NOTE — Telephone Encounter (Signed)
Lets changed to Viibryd there is a 2-week sample pack in the med closet, in a bag with her name on it.  It is 10 mg daily for 1 week then 20 mg after that.  If she is doing well and tolerating the meds okay after 7 to 10 days, let me know and I can send in prescription.

## 2020-10-17 NOTE — Telephone Encounter (Signed)
Sheralyn Boatman, can you try to reach her. I am not sure if she has called back but if you can let her know.

## 2020-10-18 NOTE — Telephone Encounter (Signed)
Still no answer LVM  

## 2020-10-18 NOTE — Telephone Encounter (Signed)
Noted  

## 2020-10-29 ENCOUNTER — Other Ambulatory Visit: Payer: Self-pay | Admitting: Physician Assistant

## 2020-11-02 ENCOUNTER — Other Ambulatory Visit: Payer: Self-pay

## 2020-11-02 ENCOUNTER — Encounter (HOSPITAL_COMMUNITY): Payer: Self-pay

## 2020-11-02 ENCOUNTER — Ambulatory Visit (HOSPITAL_COMMUNITY): Payer: 59 | Attending: Neurosurgery

## 2020-11-02 DIAGNOSIS — M545 Low back pain, unspecified: Secondary | ICD-10-CM | POA: Diagnosis present

## 2020-11-02 DIAGNOSIS — R29898 Other symptoms and signs involving the musculoskeletal system: Secondary | ICD-10-CM | POA: Insufficient documentation

## 2020-11-02 DIAGNOSIS — G8929 Other chronic pain: Secondary | ICD-10-CM | POA: Insufficient documentation

## 2020-11-02 DIAGNOSIS — M6281 Muscle weakness (generalized): Secondary | ICD-10-CM | POA: Insufficient documentation
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
## 2020-11-12 ENCOUNTER — Telehealth: Payer: 59 | Admitting: Physician Assistant

## 2020-11-12 DIAGNOSIS — J4540 Moderate persistent asthma, uncomplicated: Secondary | ICD-10-CM | POA: Diagnosis not present

## 2020-11-12 MED ORDER — ALBUTEROL SULFATE HFA 108 (90 BASE) MCG/ACT IN AERS: 1.0000 | INHALATION_SPRAY | Freq: Four times a day (QID) | RESPIRATORY_TRACT | 0 refills | Status: DC | PRN

## 2020-11-12 NOTE — Progress Notes (Signed)
I have spent 5 minutes in review of e-visit questionnaire, review and updating patient chart, medical decision making and response to patient.   Khamil Lamica Cody Vaishnav Demartin, PA-C    

## 2020-11-12 NOTE — Progress Notes (Signed)
Visit for Asthma  Based on what you have shared with me, it looks like you may have a flare up of your asthma.  Asthma is a chronic (ongoing) lung disease which results in airway obstruction, inflammation and hyper-responsiveness.   Asthma symptoms vary from person to person, with common symptoms including nighttime awakening and decreased ability to participate in normal activities as a result of shortness of breath. It is often triggered by changes in weather, changes in the season, changes in air temperature, or inside (home, school, daycare or work) allergens such as animal dander, mold, mildew, woodstoves or cockroaches.   It can also be triggered by hormonal changes, extreme emotion, physical exertion or an upper respiratory tract illness.     It is important to identify the trigger, and then eliminate or avoid the trigger if possible.   If you have been prescribed medications to be taken on a regular basis, it is important to follow the asthma action plan and to follow guidelines to adjust medication in response to increasing symptoms of decreased peak expiratory flow rate  Treatment: I have prescribed: Albuterol (Proventil HFA; Ventolin HFA) 108 (90 Base) MCG/ACT Inhaler 2 puffs into the lungs every six hours as needed for wheezing or shortness of breath  HOME CARE Only take medications as instructed by your medical team. Consider wearing a mask or scarf to improve breathing air temperature have been shown to decrease irritation and decrease exacerbations Get rest. Taking a steamy shower or using a humidifier may help nasal congestion sand ease sore throat pain. You can place a towel over your head and breathe in the steam from hot water coming from a faucet. Using a saline nasal spray works much the same way.  Cough drops, hare candies and sore throat lozenges may ease your  cough.  Avoid close contacts especially the very you and the elderly Cover your mouth if you cough or sneeze Always remember to wash your hands.    GET HELP RIGHT AWAY IF: You develop worsening symptoms; breathlessness at rest, drowsy, confused or agitated, unable to speak in full sentences You have coughing fits You develop a severe headache or visual changes You develop shortness of breath, difficulty breathing or start having chest pain Your symptoms persist after you have completed your treatment plan If your symptoms do not improve within 10 days  MAKE SURE YOU Understand these instructions. Will watch your condition. Will get help right away if you are not doing well or get worse.   Your e-visit answers were reviewed by a board certified advanced clinical practitioner to complete your personal care plan, Depending upon the condition, your plan could have included both over the counter or prescription medications.   Please review your pharmacy choice. Your safety is important to us. If you have drug allergies check your prescription carefully.  You can use MyChart to ask questions about today's visit, request a non-urgent  call back, or ask for a work or school excuse for 24 hours related to this e-Visit. If it has been greater than 24 hours you will need to follow up with your provider, or enter a new e-Visit to address those concerns.   You will get an e-mail in the next two days asking about your experience. I hope that your e-visit has been valuable and will speed your recovery. Thank you for using e-visits.  

## 2020-11-16 ENCOUNTER — Ambulatory Visit (HOSPITAL_COMMUNITY): Payer: 59

## 2020-11-16 ENCOUNTER — Encounter (HOSPITAL_COMMUNITY): Payer: Self-pay

## 2020-11-16 ENCOUNTER — Other Ambulatory Visit: Payer: Self-pay

## 2020-11-16 DIAGNOSIS — G8929 Other chronic pain: Secondary | ICD-10-CM

## 2020-11-16 DIAGNOSIS — M6281 Muscle weakness (generalized): Secondary | ICD-10-CM | POA: Diagnosis not present

## 2020-11-16 DIAGNOSIS — R29898 Other symptoms and signs involving the musculoskeletal system: Secondary | ICD-10-CM
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
## 2020-11-19 ENCOUNTER — Telehealth (INDEPENDENT_AMBULATORY_CARE_PROVIDER_SITE_OTHER): Payer: 59 | Admitting: Physician Assistant

## 2020-11-19 ENCOUNTER — Other Ambulatory Visit: Payer: Self-pay

## 2020-11-19 ENCOUNTER — Encounter: Payer: Self-pay | Admitting: Physician Assistant

## 2020-11-19 DIAGNOSIS — F4310 Post-traumatic stress disorder, unspecified: Secondary | ICD-10-CM

## 2020-11-19 DIAGNOSIS — F411 Generalized anxiety disorder: Secondary | ICD-10-CM

## 2020-11-19 DIAGNOSIS — F331 Major depressive disorder, recurrent, moderate: Secondary | ICD-10-CM

## 2020-11-19 MED ORDER — VILAZODONE HCL 20 MG PO TABS: 20.0000 mg | ORAL_TABLET | Freq: Every day | ORAL | 1 refills | Status: DC

## 2020-11-19 MED ORDER — CLONAZEPAM 1 MG PO TABS: 1.0000 mg | ORAL_TABLET | Freq: Three times a day (TID) | ORAL | 5 refills | Status: DC

## 2020-11-19 MED ORDER — LEVOTHYROXINE SODIUM 25 MCG PO TABS: ORAL_TABLET | ORAL | 0 refills | Status: DC
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
## 2020-11-23 ENCOUNTER — Ambulatory Visit (HOSPITAL_COMMUNITY): Payer: 59 | Attending: Neurosurgery

## 2020-11-23 ENCOUNTER — Other Ambulatory Visit: Payer: Self-pay

## 2020-11-23 ENCOUNTER — Other Ambulatory Visit: Payer: Self-pay | Admitting: Physician Assistant

## 2020-11-23 DIAGNOSIS — R29898 Other symptoms and signs involving the musculoskeletal system: Secondary | ICD-10-CM

## 2020-11-23 DIAGNOSIS — M545 Low back pain, unspecified: Secondary | ICD-10-CM | POA: Diagnosis present

## 2020-11-23 DIAGNOSIS — M6281 Muscle weakness (generalized): Secondary | ICD-10-CM

## 2020-11-23 DIAGNOSIS — G8929 Other chronic pain: Secondary | ICD-10-CM | POA: Insufficient documentation

## 2020-11-23 NOTE — Patient Instructions (Signed)
Access Code: [email or web address] URL: [email or web address] Date: 11/23/2020 Prepared by: Shary Decamp  Exercises Quadruped Alternating Arm Lift - 1 x daily - 7 x weekly - 3 sets - 10 reps Bird Dog - 1 x daily - 7 x weekly - 3 sets - 10 reps Forward T with Counter Support - 1 x daily - 7 x weekly - 3 sets - 10 reps
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
## 2020-11-30 ENCOUNTER — Encounter (HOSPITAL_COMMUNITY): Payer: 59

## 2020-12-07 ENCOUNTER — Encounter (HOSPITAL_COMMUNITY): Payer: Self-pay

## 2020-12-07 ENCOUNTER — Ambulatory Visit (HOSPITAL_COMMUNITY): Payer: 59

## 2020-12-07 ENCOUNTER — Telehealth: Payer: 59 | Admitting: Physician Assistant

## 2020-12-07 ENCOUNTER — Other Ambulatory Visit: Payer: Self-pay

## 2020-12-07 DIAGNOSIS — R29898 Other symptoms and signs involving the musculoskeletal system: Secondary | ICD-10-CM

## 2020-12-07 DIAGNOSIS — J4520 Mild intermittent asthma, uncomplicated: Secondary | ICD-10-CM | POA: Diagnosis not present

## 2020-12-07 DIAGNOSIS — M6281 Muscle weakness (generalized): Secondary | ICD-10-CM | POA: Diagnosis not present

## 2020-12-07 DIAGNOSIS — M545 Low back pain, unspecified: Secondary | ICD-10-CM

## 2020-12-07 DIAGNOSIS — G8929 Other chronic pain: Secondary | ICD-10-CM

## 2020-12-07 MED ORDER — ALBUTEROL SULFATE HFA 108 (90 BASE) MCG/ACT IN AERS: 1.0000 | INHALATION_SPRAY | Freq: Four times a day (QID) | RESPIRATORY_TRACT | 0 refills | Status: DC | PRN

## 2020-12-07 NOTE — Progress Notes (Signed)
Visit for Asthma  Based on what you have shared with me, it looks like you may have a flare up of your asthma.  Asthma is a chronic (ongoing) lung disease which results in airway obstruction, inflammation and hyper-responsiveness.   Asthma symptoms vary from person to person, with common symptoms including nighttime awakening and decreased ability to participate in normal activities as a result of shortness of breath. It is often triggered by changes in weather, changes in the season, changes in air temperature, or inside (home, school, daycare or work) allergens such as animal dander, mold, mildew, woodstoves or cockroaches.   It can also be triggered by hormonal changes, extreme emotion, physical exertion or an upper respiratory tract illness.     It is important to identify the trigger, and then eliminate or avoid the trigger if possible.   If you have been prescribed medications to be taken on a regular basis, it is important to follow the asthma action plan and to follow guidelines to adjust medication in response to increasing symptoms of decreased peak expiratory flow rate  Treatment: I have prescribed: Albuterol (Proventil HFA; Ventolin HFA) 108 (90 Base) MCG/ACT Inhaler 2 puffs into the lungs every six hours as needed for wheezing or shortness of breath  You should follow up with your primary care office for continued management of asthma and refills of your inhaler.   HOME CARE Only take medications as instructed by your medical team. Consider wearing a mask or scarf to improve breathing air temperature have been shown to decrease irritation and decrease exacerbations Get rest. Taking a steamy shower or using a humidifier may help nasal congestion sand ease sore throat pain. You can place a towel over your head and breathe in the steam from hot water coming from a  faucet. Using a saline nasal spray works much the same way.  Cough drops, hare candies and sore throat lozenges may ease your cough.  Avoid close contacts especially the very you and the elderly Cover your mouth if you cough or sneeze Always remember to wash your hands.    GET HELP RIGHT AWAY IF: You develop worsening symptoms; breathlessness at rest, drowsy, confused or agitated, unable to speak in full sentences You have coughing fits You develop a severe headache or visual changes You develop shortness of breath, difficulty breathing or start having chest pain Your symptoms persist after you have completed your treatment plan If your symptoms do not improve within 10 days  MAKE SURE YOU Understand these instructions. Will watch your condition. Will get help right away if you are not doing well or get worse.   Your e-visit answers were reviewed by a board certified advanced clinical practitioner to complete your personal care plan, Depending upon the condition, your plan could have included both over the counter or prescription medications.   Please review your pharmacy choice. Your safety is important to [place]. If you have drug allergies check your prescription carefully.  You can use MyChart to ask questions about today's visit, request a non-urgent  call back, or ask for a work or school excuse for 24 hours related to this e-Visit. If it has been greater than 24 hours you will need to follow up with your provider, or enter a new e-Visit to address those concerns.   You will get an e-mail in the next two days asking about your experience. I hope that your e-visit has been valuable and will speed your recovery. Thank you for using e-visits.  I provided 7 minutes of non face-to-face time during this encounter for chart review and documentation.
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
## 2020-12-14 ENCOUNTER — Encounter (HOSPITAL_COMMUNITY): Payer: 59

## 2020-12-21 ENCOUNTER — Encounter (HOSPITAL_COMMUNITY): Payer: Self-pay

## 2020-12-21 ENCOUNTER — Ambulatory Visit (HOSPITAL_COMMUNITY): Payer: 59

## 2020-12-21 ENCOUNTER — Other Ambulatory Visit: Payer: Self-pay

## 2020-12-21 DIAGNOSIS — G8929 Other chronic pain: Secondary | ICD-10-CM

## 2020-12-21 DIAGNOSIS — M6281 Muscle weakness (generalized): Secondary | ICD-10-CM | POA: Diagnosis not present

## 2020-12-21 DIAGNOSIS — R29898 Other symptoms and signs involving the musculoskeletal system: Secondary | ICD-10-CM
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
## 2020-12-28 ENCOUNTER — Encounter (HOSPITAL_COMMUNITY): Payer: 59

## 2021-01-04 ENCOUNTER — Ambulatory Visit (HOSPITAL_COMMUNITY): Payer: 59 | Admitting: Physical Therapy

## 2021-01-05 ENCOUNTER — Other Ambulatory Visit: Payer: Self-pay | Admitting: Physician Assistant

## 2021-01-07 NOTE — Telephone Encounter (Signed)
Please schedule appt

## 2021-01-09 NOTE — Telephone Encounter (Addendum)
Pt has apt scheduled 10/7. Please send Gabapentin Rx to [facility]. Pt out since passed weekend.

## 2021-01-11 ENCOUNTER — Encounter (HOSPITAL_COMMUNITY): Payer: Self-pay

## 2021-01-11 ENCOUNTER — Other Ambulatory Visit: Payer: Self-pay

## 2021-01-11 ENCOUNTER — Ambulatory Visit (HOSPITAL_COMMUNITY): Payer: 59 | Attending: Neurosurgery

## 2021-01-11 DIAGNOSIS — G8929 Other chronic pain: Secondary | ICD-10-CM | POA: Diagnosis present

## 2021-01-11 DIAGNOSIS — M6281 Muscle weakness (generalized): Secondary | ICD-10-CM | POA: Insufficient documentation

## 2021-01-11 DIAGNOSIS — M545 Low back pain, unspecified: Secondary | ICD-10-CM | POA: Diagnosis present

## 2021-01-11 DIAGNOSIS — R29898 Other symptoms and signs involving the musculoskeletal system: Secondary | ICD-10-CM | POA: Insufficient documentation

## 2021-01-11 NOTE — Patient Instructions (Signed)
HIP: Flexion / KNEE: Extension, Heel Strike - Standing      Standing tall with ab set bring leg forward, then out to side and then extend.   Hold on for balance as needed.  Hold for 5" each direction.  Complete both legs.
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
## 2021-01-18 ENCOUNTER — Other Ambulatory Visit: Payer: Self-pay

## 2021-01-18 ENCOUNTER — Ambulatory Visit (HOSPITAL_COMMUNITY): Payer: 59

## 2021-01-18 DIAGNOSIS — M6281 Muscle weakness (generalized): Secondary | ICD-10-CM | POA: Diagnosis not present

## 2021-01-18 DIAGNOSIS — R29898 Other symptoms and signs involving the musculoskeletal system: Secondary | ICD-10-CM

## 2021-01-18 DIAGNOSIS — G8929 Other chronic pain: Secondary | ICD-10-CM
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
## 2021-01-21 ENCOUNTER — Other Ambulatory Visit: Payer: Self-pay | Admitting: Physician Assistant

## 2021-01-24 ENCOUNTER — Other Ambulatory Visit: Payer: Self-pay | Admitting: Physician Assistant

## 2021-01-24 ENCOUNTER — Telehealth: Payer: Self-pay | Admitting: Physician Assistant

## 2021-01-24 MED ORDER — GABAPENTIN 600 MG PO TABS: 600.0000 mg | ORAL_TABLET | Freq: Three times a day (TID) | ORAL | 3 refills | Status: DC

## 2021-01-24 NOTE — Telephone Encounter (Signed)
Next visit is 03/01/21. Valerie Evans was taking Gabapentin 600 mg twice a day and she said it was increased to 3 times a day. She got her prescription filled and the bottle shows Gabapentin 2 pills a day. She is concerned that if she uses this RX that she will run out of her medication sooner. Please call her at [phone] and let her know if she is to take two or three a day of Gabapentin? Her phone number is [phone]

## 2021-01-24 NOTE — Telephone Encounter (Signed)
Please let her know I sent in a new Rx that gives the correct directions (tid) and qty (270) for 3 months.

## 2021-01-24 NOTE — Telephone Encounter (Signed)
Please review

## 2021-01-25 NOTE — Telephone Encounter (Signed)
LVM with info

## 2021-02-01 ENCOUNTER — Encounter (HOSPITAL_COMMUNITY): Payer: 59 | Admitting: Physical Therapy

## 2021-02-08 ENCOUNTER — Other Ambulatory Visit: Payer: Self-pay

## 2021-02-08 ENCOUNTER — Ambulatory Visit (HOSPITAL_COMMUNITY): Payer: 59 | Attending: Neurosurgery | Admitting: Physical Therapy

## 2021-02-08 DIAGNOSIS — R29898 Other symptoms and signs involving the musculoskeletal system: Secondary | ICD-10-CM | POA: Insufficient documentation

## 2021-02-08 DIAGNOSIS — G8929 Other chronic pain: Secondary | ICD-10-CM | POA: Diagnosis present

## 2021-02-08 DIAGNOSIS — M545 Low back pain, unspecified: Secondary | ICD-10-CM | POA: Insufficient documentation

## 2021-02-08 DIAGNOSIS — M6281 Muscle weakness (generalized): Secondary | ICD-10-CM | POA: Diagnosis not present
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
## 2021-02-15 ENCOUNTER — Ambulatory Visit (HOSPITAL_COMMUNITY): Payer: 59

## 2021-02-15 ENCOUNTER — Ambulatory Visit (INDEPENDENT_AMBULATORY_CARE_PROVIDER_SITE_OTHER): Payer: 59 | Admitting: Registered Nurse

## 2021-02-15 ENCOUNTER — Encounter: Payer: Self-pay | Admitting: Registered Nurse

## 2021-02-15 ENCOUNTER — Encounter (HOSPITAL_COMMUNITY): Payer: Self-pay

## 2021-02-15 ENCOUNTER — Other Ambulatory Visit: Payer: Self-pay

## 2021-02-15 VITALS — BP 110/70 | Temp 98.1°F | Resp 17 | Wt 107.2 lb

## 2021-02-15 DIAGNOSIS — J4540 Moderate persistent asthma, uncomplicated: Secondary | ICD-10-CM | POA: Diagnosis not present

## 2021-02-15 DIAGNOSIS — R748 Abnormal levels of other serum enzymes: Secondary | ICD-10-CM | POA: Diagnosis not present

## 2021-02-15 DIAGNOSIS — J22 Unspecified acute lower respiratory infection: Secondary | ICD-10-CM | POA: Diagnosis not present

## 2021-02-15 DIAGNOSIS — K120 Recurrent oral aphthae: Secondary | ICD-10-CM | POA: Diagnosis not present

## 2021-02-15 DIAGNOSIS — M329 Systemic lupus erythematosus, unspecified: Secondary | ICD-10-CM | POA: Diagnosis not present

## 2021-02-15 DIAGNOSIS — R5382 Chronic fatigue, unspecified: Secondary | ICD-10-CM

## 2021-02-15 DIAGNOSIS — M6281 Muscle weakness (generalized): Secondary | ICD-10-CM

## 2021-02-15 DIAGNOSIS — Z13220 Encounter for screening for lipoid disorders: Secondary | ICD-10-CM | POA: Diagnosis not present

## 2021-02-15 DIAGNOSIS — N390 Urinary tract infection, site not specified: Secondary | ICD-10-CM

## 2021-02-15 DIAGNOSIS — M545 Low back pain, unspecified: Secondary | ICD-10-CM

## 2021-02-15 DIAGNOSIS — R29898 Other symptoms and signs involving the musculoskeletal system: Secondary | ICD-10-CM

## 2021-02-15 LAB — CBC WITH DIFFERENTIAL/PLATELET
Basophils Absolute: 0.1 10*3/uL (ref 0.0–0.1)
Basophils Relative: 1.5 % (ref 0.0–3.0)
Eosinophils Absolute: 0.4 10*3/uL (ref 0.0–0.7)
Eosinophils Relative: 6.4 % — ABNORMAL HIGH (ref 0.0–5.0)
HCT: 39.8 % (ref 36.0–46.0)
Hemoglobin: 13 g/dL (ref 12.0–15.0)
Lymphocytes Relative: 30.3 % (ref 12.0–46.0)
Lymphs Abs: 1.8 10*3/uL (ref 0.7–4.0)
MCHC: 32.7 g/dL (ref 30.0–36.0)
MCV: 88.9 fl (ref 78.0–100.0)
Monocytes Absolute: 0.6 10*3/uL (ref 0.1–1.0)
Monocytes Relative: 10 % (ref 3.0–12.0)
Neutro Abs: 3 10*3/uL (ref 1.4–7.7)
Neutrophils Relative %: 51.8 % (ref 43.0–77.0)
Platelets: 263 10*3/uL (ref 150.0–400.0)
RBC: 4.47 Mil/uL (ref 3.87–5.11)
RDW: 14.5 % (ref 11.5–15.5)
WBC: 5.8 10*3/uL (ref 4.0–10.5)

## 2021-02-15 LAB — COMPREHENSIVE METABOLIC PANEL
ALT: 10 U/L (ref 0–35)
AST: 15 U/L (ref 0–37)
Albumin: 4.6 g/dL (ref 3.5–5.2)
Alkaline Phosphatase: 118 U/L — ABNORMAL HIGH (ref 39–117)
BUN: 9 mg/dL (ref 6–23)
CO2: 32 mEq/L (ref 19–32)
Calcium: 10.1 mg/dL (ref 8.4–10.5)
Chloride: 103 mEq/L (ref 96–112)
Creatinine, Ser: 0.76 mg/dL (ref 0.40–1.20)
GFR: 102.51 mL/min (ref >60.00)
Glucose, Bld: 73 mg/dL (ref 70–99)
Potassium: 4.4 mEq/L (ref 3.5–5.1)
Sodium: 142 mEq/L (ref 135–145)
Total Bilirubin: 0.2 mg/dL (ref 0.2–1.2)
Total Protein: 7.5 g/dL (ref 6.0–8.3)

## 2021-02-15 LAB — LIPID PANEL
Cholesterol: 176 mg/dL (ref 0–200)
HDL: 48.8 mg/dL (ref >39.00)
NonHDL: 127.03
Total CHOL/HDL Ratio: 4
Triglycerides: 248 mg/dL — ABNORMAL HIGH (ref 0.0–149.0)
VLDL: 49.6 mg/dL — ABNORMAL HIGH (ref 0.0–40.0)

## 2021-02-15 LAB — URINALYSIS, ROUTINE W REFLEX MICROSCOPIC
Bilirubin Urine: NEGATIVE
Hgb urine dipstick: NEGATIVE
Ketones, ur: NEGATIVE
Leukocytes,Ua: NEGATIVE
Nitrite: NEGATIVE
Specific Gravity, Urine: <=1.005 — AB (ref 1.000–1.030)
Total Protein, Urine: NEGATIVE
Urine Glucose: NEGATIVE
Urobilinogen, UA: 0.2 (ref 0.0–1.0)
pH: 6 (ref 5.0–8.0)

## 2021-02-15 LAB — LDL CHOLESTEROL, DIRECT: Direct LDL: 99 mg/dL

## 2021-02-15 LAB — B12 AND FOLATE PANEL
Folate: 12.5 ng/mL (ref >5.9)
Vitamin B-12: 495 pg/mL (ref 211–911)

## 2021-02-15 LAB — T4, FREE: Free T4: 0.81 ng/dL (ref 0.60–1.60)

## 2021-02-15 LAB — HEMOGLOBIN A1C: Hgb A1c MFr Bld: 5.5 % (ref 4.6–6.5)

## 2021-02-15 LAB — VITAMIN D 25 HYDROXY (VIT D DEFICIENCY, FRACTURES): VITD: 36.4 ng/mL (ref 30.00–100.00)

## 2021-02-15 LAB — TSH: TSH: 2.1 u[IU]/mL (ref 0.35–5.50)

## 2021-02-15 MED ORDER — QVAR REDIHALER 80 MCG/ACT IN AERB: 1.0000 | INHALATION_SPRAY | Freq: Two times a day (BID) | RESPIRATORY_TRACT | 5 refills | Status: DC | PRN

## 2021-02-15 MED ORDER — PREDNISONE 10 MG (21) PO TBPK
ORAL_TABLET | ORAL | 0 refills | Status: DC
Start: 2021-02-15 — End: 2021-04-12

## 2021-02-15 MED ORDER — LIDOCAINE VISCOUS HCL 2 % MT SOLN: 5.0000 mL | Freq: Three times a day (TID) | OROMUCOSAL | 2 refills | Status: DC | PRN

## 2021-02-15 MED ORDER — AZITHROMYCIN 250 MG PO TABS: ORAL_TABLET | ORAL | 0 refills | Status: AC

## 2021-02-15 NOTE — Addendum Note (Signed)
Addended by: Cleda Mccreedy F on: 02/15/2021 09:22 AM   Modules accepted: Orders
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
## 2021-02-15 NOTE — Patient Instructions (Signed)
Valerie Evans -   In brief:  Labs today should be back this afternoon I have sent Magic Mouthwash I have sent prednisone (take in mornings) and z pack (take as directed) for respiratory infection. Encourage compliance with qvar - twice daily every day. Consider cranberry juice or d-mannose for UTI prevention. Ultimately will need follow up with urology for the cystoscopy. I definitely recommend the Viibryd!  Let's touch base in 6 mo at the latest, certainly sooner if you need anything!  Thanks   [employer]

## 2021-02-15 NOTE — Progress Notes (Signed)
Key: [id number] - PA Case ID: [id number] help? Call us at [phone] Status Sent to [facility] Qvar RediHaler 80MCG/ACT aerosol Form [employer] ePA Form 2017 NCPDP

## 2021-02-18 ENCOUNTER — Encounter (HOSPITAL_COMMUNITY): Payer: Self-pay

## 2021-02-18 ENCOUNTER — Ambulatory Visit (HOSPITAL_COMMUNITY): Payer: 59

## 2021-02-18 ENCOUNTER — Other Ambulatory Visit: Payer: Self-pay

## 2021-02-18 DIAGNOSIS — M545 Low back pain, unspecified: Secondary | ICD-10-CM

## 2021-02-18 DIAGNOSIS — G8929 Other chronic pain: Secondary | ICD-10-CM

## 2021-02-18 DIAGNOSIS — M6281 Muscle weakness (generalized): Secondary | ICD-10-CM

## 2021-02-18 DIAGNOSIS — R29898 Other symptoms and signs involving the musculoskeletal system: Secondary | ICD-10-CM
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
## 2021-02-24 ENCOUNTER — Other Ambulatory Visit: Payer: Self-pay | Admitting: Family

## 2021-02-28 ENCOUNTER — Other Ambulatory Visit: Payer: Self-pay

## 2021-02-28 ENCOUNTER — Encounter: Payer: Self-pay | Admitting: Registered Nurse

## 2021-02-28 MED ORDER — LEVOTHYROXINE SODIUM 25 MCG PO TABS: ORAL_TABLET | ORAL | 0 refills | Status: DC

## 2021-03-01 ENCOUNTER — Ambulatory Visit (INDEPENDENT_AMBULATORY_CARE_PROVIDER_SITE_OTHER): Payer: 59 | Admitting: Physician Assistant

## 2021-03-01 ENCOUNTER — Encounter: Payer: Self-pay | Admitting: Physician Assistant

## 2021-03-01 ENCOUNTER — Encounter (HOSPITAL_COMMUNITY): Payer: 59

## 2021-03-01 ENCOUNTER — Other Ambulatory Visit: Payer: Self-pay

## 2021-03-01 DIAGNOSIS — F4310 Post-traumatic stress disorder, unspecified: Secondary | ICD-10-CM | POA: Diagnosis not present

## 2021-03-01 DIAGNOSIS — F411 Generalized anxiety disorder: Secondary | ICD-10-CM | POA: Diagnosis not present

## 2021-03-01 DIAGNOSIS — F331 Major depressive disorder, recurrent, moderate: Secondary | ICD-10-CM | POA: Diagnosis not present

## 2021-03-01 DIAGNOSIS — F900 Attention-deficit hyperactivity disorder, predominantly inattentive type: Secondary | ICD-10-CM

## 2021-03-01 MED ORDER — VILAZODONE HCL 20 MG PO TABS: 20.0000 mg | ORAL_TABLET | Freq: Every day | ORAL | 1 refills | Status: DC

## 2021-03-01 MED ORDER — BUPROPION HCL ER (XL) 150 MG PO TB24: 450.0000 mg | ORAL_TABLET | Freq: Every day | ORAL | 1 refills | Status: DC
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
## 2021-03-06 ENCOUNTER — Other Ambulatory Visit: Payer: Self-pay | Admitting: Registered Nurse

## 2021-03-06 DIAGNOSIS — J4540 Moderate persistent asthma, uncomplicated: Secondary | ICD-10-CM

## 2021-03-06 MED ORDER — FLUTICASONE PROPIONATE HFA 110 MCG/ACT IN AERO: 2.0000 | INHALATION_SPRAY | Freq: Two times a day (BID) | RESPIRATORY_TRACT | 4 refills | Status: DC

## 2021-03-06 NOTE — Progress Notes (Signed)
Ins will no longer cover Qvar. Sending Flovent HFA as this was cited in step therapy per PA feedback  Jari Sportsman, NP

## 2021-03-10 ENCOUNTER — Encounter: Payer: Self-pay | Admitting: Registered Nurse

## 2021-03-11 ENCOUNTER — Ambulatory Visit (HOSPITAL_COMMUNITY): Payer: 59 | Attending: Neurosurgery

## 2021-03-11 ENCOUNTER — Other Ambulatory Visit: Payer: Self-pay

## 2021-03-11 ENCOUNTER — Other Ambulatory Visit: Payer: Self-pay | Admitting: Registered Nurse

## 2021-03-11 DIAGNOSIS — M6281 Muscle weakness (generalized): Secondary | ICD-10-CM | POA: Insufficient documentation

## 2021-03-11 DIAGNOSIS — M545 Low back pain, unspecified: Secondary | ICD-10-CM | POA: Diagnosis present

## 2021-03-11 DIAGNOSIS — R29898 Other symptoms and signs involving the musculoskeletal system: Secondary | ICD-10-CM | POA: Insufficient documentation

## 2021-03-11 DIAGNOSIS — J4520 Mild intermittent asthma, uncomplicated: Secondary | ICD-10-CM

## 2021-03-11 DIAGNOSIS — G8929 Other chronic pain: Secondary | ICD-10-CM | POA: Insufficient documentation

## 2021-03-11 MED ORDER — FLUTICASONE-SALMETEROL 250-50 MCG/ACT IN AEPB: 1.0000 | INHALATION_SPRAY | Freq: Two times a day (BID) | RESPIRATORY_TRACT | 11 refills | Status: DC

## 2021-03-11 MED ORDER — ALBUTEROL SULFATE HFA 108 (90 BASE) MCG/ACT IN AERS: 1.0000 | INHALATION_SPRAY | Freq: Four times a day (QID) | RESPIRATORY_TRACT | 11 refills | Status: DC | PRN

## 2021-03-11 NOTE — Addendum Note (Signed)
Addended by: Dion Body on: 03/11/2021 12:58 PM   Modules accepted: Orders
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
## 2021-03-18 ENCOUNTER — Other Ambulatory Visit: Payer: Self-pay

## 2021-03-18 ENCOUNTER — Ambulatory Visit (HOSPITAL_COMMUNITY): Payer: 59

## 2021-03-18 DIAGNOSIS — M6281 Muscle weakness (generalized): Secondary | ICD-10-CM

## 2021-03-18 DIAGNOSIS — G8929 Other chronic pain: Secondary | ICD-10-CM

## 2021-03-18 DIAGNOSIS — R29898 Other symptoms and signs involving the musculoskeletal system: Secondary | ICD-10-CM
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
## 2021-04-03 ENCOUNTER — Other Ambulatory Visit: Payer: Self-pay | Admitting: Gastroenterology

## 2021-04-03 DIAGNOSIS — R140 Abdominal distension (gaseous): Secondary | ICD-10-CM

## 2021-04-03 DIAGNOSIS — R1013 Epigastric pain: Secondary | ICD-10-CM

## 2021-04-04 ENCOUNTER — Ambulatory Visit
Admission: RE | Admit: 2021-04-04 | Discharge: 2021-04-04 | Disposition: A | Discharge status: Home / Self Care | Payer: 59 | Source: Ambulatory Visit | Attending: Gastroenterology | Admitting: Gastroenterology

## 2021-04-04 DIAGNOSIS — R140 Abdominal distension (gaseous): Secondary | ICD-10-CM

## 2021-04-04 DIAGNOSIS — R1013 Epigastric pain: Secondary | ICD-10-CM

## 2021-04-12 ENCOUNTER — Encounter: Payer: Self-pay | Admitting: Physician Assistant

## 2021-04-12 ENCOUNTER — Ambulatory Visit (INDEPENDENT_AMBULATORY_CARE_PROVIDER_SITE_OTHER): Payer: 59 | Admitting: Physician Assistant

## 2021-04-12 DIAGNOSIS — F331 Major depressive disorder, recurrent, moderate: Secondary | ICD-10-CM | POA: Diagnosis not present

## 2021-04-12 DIAGNOSIS — F411 Generalized anxiety disorder: Secondary | ICD-10-CM

## 2021-04-12 DIAGNOSIS — F4310 Post-traumatic stress disorder, unspecified: Secondary | ICD-10-CM

## 2021-04-12 MED ORDER — QUETIAPINE FUMARATE 300 MG PO TABS: 450.0000 mg | ORAL_TABLET | Freq: Every day | ORAL | 0 refills | Status: DC
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
## 2021-04-17 ENCOUNTER — Other Ambulatory Visit: Payer: Self-pay

## 2021-04-17 ENCOUNTER — Encounter (HOSPITAL_COMMUNITY): Payer: Self-pay | Admitting: Surgery

## 2021-04-17 ENCOUNTER — Ambulatory Visit: Payer: Self-pay | Admitting: Surgery

## 2021-04-17 NOTE — Pre-Procedure Instructions (Signed)
PCP - Janeece Agee, NP Cardiologist - n/a [facility] [address]  PPM/ICD - n/a  Chest x-ray - 1 view 05/08/20 EKG - n/a Stress Test - n/a ECHO - n/a Cardiac Cath - n/a  CPAP-n/a  Fasting Blood Sugar - n/a  Blood Thinner Instructions: n/a Aspirin Instructions: As of today, STOP taking any Aspirin (unless otherwise instructed by your surgeon) Aleve, Naproxen, Ibuprofen, Motrin, Advil, Goody's, BC's, all herbal medications, fish oil, and all vitamins.  ERAS Protcol - yes, clears until 0430  COVID TEST- n/a; ambulatory surgery  Anesthesia review: yes, hx of Lupus, chart given to Revonda Standard, PA-C  Patient verbally denies any shortness of breath, fever, cough and chest pain during phone call   -------------  SDW INSTRUCTIONS given:  Your procedure is scheduled on Friday, November 25th, 2022.  Report to [facility] Main Entrance "A" at 05:30 A.M., and check in at the Admitting office.  Call this number if you have problems the morning of surgery:  [phone]   Remember:  Do not eat after midnight the night before your surgery  You may drink clear liquids until 04:30 A.M. the morning of your surgery.   Clear liquids allowed are: Water, Non-Citrus Juices (without pulp), Carbonated Beverages, Clear Tea, Black Coffee Only, and Gatorade    Take these medicines the morning of surgery with A SIP OF WATER: buPROPion (WELLBUTRIN XL) fluticasone-salmeterol (ADVAIR DISKUS) 250-50 MCG/ACT AEPB-please bring with you day of surgery gabapentin (NEURONTIN) levothyroxine (SYNTHROID) ondansetron (ZOFRAN)   AS NEEDED: albuterol (VENTOLIN HFA) 108 (90 Base) MCG/ACT inhaler-please bring with you day of surgery clonazePAM (KLONOPIN)  As of today, STOP taking any hydroxychloroquine (PLAQUENIL), Aspirin (unless otherwise instructed by your surgeon) Aleve, Naproxen, Ibuprofen, Motrin, Advil, Goody's, BC's, all herbal medications, fish oil, and all vitamins.                       Do not wear jewelry, make up, or nail polish            Do not wear lotions, powders, perfumes/colognes, or deodorant.            Do not shave 48 hours prior to surgery.  Men may shave face and neck.            Do not bring valuables to the hospital.            [facility] is not responsible for any belongings or valuables.  Do NOT Smoke (Tobacco/Vaping) or drink Alcohol 24 hours prior to your procedure If you use a CPAP at night, you may bring all equipment for your overnight stay.   Contacts, glasses, dentures or bridgework may not be worn into surgery.     For patients admitted to the hospital, discharge time will be determined by your treatment team.   Patients discharged the day of surgery will not be allowed to drive home, and someone needs to stay with them for 24 hours.    Special instructions:   [facility]- Preparing For Surgery  Before surgery, you can play an important role. Because skin is not sterile, your skin needs to be as free of germs as possible. You can reduce the number of germs on your skin by washing with CHG (chlorahexidine gluconate) Soap before surgery.  CHG is an antiseptic cleaner which kills germs and bonds with the skin to continue killing germs even after washing.    Oral Hygiene is also important to reduce your risk  of infection.  Remember - BRUSH YOUR TEETH THE MORNING OF SURGERY WITH YOUR REGULAR TOOTHPASTE  Please do not use if you have an allergy to CHG or antibacterial soaps. If your skin becomes reddened/irritated stop using the CHG.  Do not shave (including legs and underarms) for at least 48 hours prior to first CHG shower. It is OK to shave your face.  Please follow these instructions carefully.   Shower the NIGHT BEFORE SURGERY and the MORNING OF SURGERY with DIAL Soap.   Pat yourself dry with a CLEAN TOWEL.  Wear CLEAN PAJAMAS to bed the night before surgery  Place CLEAN SHEETS on your bed the night of your first  shower and DO NOT SLEEP WITH PETS.   Day of Surgery: Please shower morning of surgery  Wear Clean/Comfortable clothing the morning of surgery Do not apply any deodorants/lotions.   Remember to brush your teeth WITH YOUR REGULAR TOOTHPASTE.   Questions were answered. Patient verbalized understanding of instructions.

## 2021-04-17 NOTE — Anesthesia Preprocedure Evaluation (Addendum)
Anesthesia Evaluation  Patient identified by MRN, date of birth, ID band Patient awake    Reviewed: Allergy & Precautions, NPO status , Patient's Chart, lab work & pertinent test results  Airway Mallampati: II  TM Distance: >3 FB Neck ROM: Full    Dental no notable dental hx.    Pulmonary asthma ,    Pulmonary exam normal breath sounds clear to auscultation       Cardiovascular Exercise Tolerance: Good negative cardio ROS Normal cardiovascular exam Rhythm:Regular Rate:Normal     Neuro/Psych  Headaches, PSYCHIATRIC DISORDERS Anxiety Depression    GI/Hepatic negative GI ROS, Neg liver ROS,   Endo/Other  Hypothyroidism lupus  Renal/GU negative Renal ROS  negative genitourinary   Musculoskeletal  (+) Fibromyalgia -  Abdominal   Peds negative pediatric ROS (+)  Hematology negative hematology ROS (+)   Anesthesia Other Findings   Reproductive/Obstetrics                           Anesthesia Physical Anesthesia Plan  ASA: 2  Anesthesia Plan: General   Post-op Pain Management: Tylenol PO (pre-op) and Celebrex PO (pre-op)   Induction: Intravenous  PONV Risk Score and Plan: 3 and Treatment may vary due to age or medical condition, Scopolamine patch - Pre-op, Midazolam, Dexamethasone and Ondansetron  Airway Management Planned: Oral ETT  Additional Equipment: None  Intra-op Plan:   Post-operative Plan:   Informed Consent: I have reviewed the patients History and Physical, chart, labs and discussed the procedure including the risks, benefits and alternatives for the proposed anesthesia with the patient or authorized representative who has indicated his/her understanding and acceptance.       Plan Discussed with: Anesthesiologist  Anesthesia Plan Comments: (PAT note written 04/17/2021 by Shonna Chock, PA-C. )       Anesthesia Quick Evaluation
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
## 2021-04-18 ENCOUNTER — Encounter (HOSPITAL_COMMUNITY): Payer: Self-pay | Admitting: Surgery

## 2021-04-19 ENCOUNTER — Other Ambulatory Visit: Payer: Self-pay

## 2021-04-19 ENCOUNTER — Ambulatory Visit (HOSPITAL_COMMUNITY)
Admission: RE | Admit: 2021-04-19 | Discharge: 2021-04-19 | Disposition: A | Discharge status: Home / Self Care | Payer: 59 | Attending: Surgery | Admitting: Surgery

## 2021-04-19 ENCOUNTER — Ambulatory Visit (HOSPITAL_COMMUNITY): Payer: 59 | Admitting: Vascular Surgery

## 2021-04-19 ENCOUNTER — Encounter (HOSPITAL_COMMUNITY): Payer: Self-pay | Admitting: Surgery

## 2021-04-19 ENCOUNTER — Encounter (HOSPITAL_COMMUNITY)
Admission: RE | Disposition: A | Discharge status: Home / Self Care | Payer: Self-pay | Source: Home / Self Care | Attending: Surgery

## 2021-04-19 DIAGNOSIS — M329 Systemic lupus erythematosus, unspecified: Secondary | ICD-10-CM | POA: Insufficient documentation

## 2021-04-19 DIAGNOSIS — J45909 Unspecified asthma, uncomplicated: Secondary | ICD-10-CM | POA: Diagnosis not present

## 2021-04-19 DIAGNOSIS — M797 Fibromyalgia: Secondary | ICD-10-CM | POA: Diagnosis not present

## 2021-04-19 DIAGNOSIS — F32A Depression, unspecified: Secondary | ICD-10-CM | POA: Diagnosis not present

## 2021-04-19 DIAGNOSIS — K811 Chronic cholecystitis: Secondary | ICD-10-CM | POA: Diagnosis not present

## 2021-04-19 DIAGNOSIS — K819 Cholecystitis, unspecified: Secondary | ICD-10-CM | POA: Diagnosis present

## 2021-04-19 DIAGNOSIS — F419 Anxiety disorder, unspecified: Secondary | ICD-10-CM | POA: Diagnosis not present

## 2021-04-19 DIAGNOSIS — E039 Hypothyroidism, unspecified: Secondary | ICD-10-CM | POA: Diagnosis not present

## 2021-04-19 HISTORY — DX: Fibromyalgia: M79.7

## 2021-04-19 HISTORY — DX: Attention-deficit hyperactivity disorder, unspecified type: F90.9

## 2021-04-19 HISTORY — DX: Post-traumatic stress disorder, unspecified: F43.10

## 2021-04-19 HISTORY — PX: CHOLECYSTECTOMY: SHX55

## 2021-04-19 HISTORY — DX: Hypothyroidism, unspecified: E03.9

## 2021-04-19 LAB — CBC
HCT: 35.9 % — ABNORMAL LOW (ref 36.0–46.0)
Hemoglobin: 11.7 g/dL — ABNORMAL LOW (ref 12.0–15.0)
MCH: 29.5 pg (ref 26.0–34.0)
MCHC: 32.6 g/dL (ref 30.0–36.0)
MCV: 90.4 fL (ref 80.0–100.0)
Platelets: 249 10*3/uL (ref 150–400)
RBC: 3.97 MIL/uL (ref 3.87–5.11)
RDW: 12.9 % (ref 11.5–15.5)
WBC: 6.6 10*3/uL (ref 4.0–10.5)
nRBC: 0 % (ref 0.0–0.2)

## 2021-04-19 LAB — POCT PREGNANCY, URINE: Preg Test, Ur: NEGATIVE

## 2021-04-19 SURGERY — LAPAROSCOPIC CHOLECYSTECTOMY
Anesthesia: General | Site: Abdomen

## 2021-04-19 MED ORDER — DEXAMETHASONE SODIUM PHOSPHATE 10 MG/ML IJ SOLN
INTRAMUSCULAR | Status: AC
  Filled 2021-04-19: qty 1

## 2021-04-19 MED ORDER — SUGAMMADEX SODIUM 200 MG/2ML IV SOLN
INTRAVENOUS | Status: DC | PRN
  Administered 2021-04-19: 100 mg via INTRAVENOUS

## 2021-04-19 MED ORDER — DEXAMETHASONE SODIUM PHOSPHATE 10 MG/ML IJ SOLN
INTRAMUSCULAR | Status: DC | PRN
  Administered 2021-04-19: 5 mg via INTRAVENOUS

## 2021-04-19 MED ORDER — BUPIVACAINE LIPOSOME 1.3 % IJ SUSP: 20.0000 mL | Freq: Once | INTRAMUSCULAR | Status: DC

## 2021-04-19 MED ORDER — NALOXONE HCL 0.4 MG/ML IJ SOLN
INTRAMUSCULAR | Status: DC | PRN
  Administered 2021-04-19 (×2): .08 mg via INTRAVENOUS

## 2021-04-19 MED ORDER — ROCURONIUM BROMIDE 10 MG/ML (PF) SYRINGE
PREFILLED_SYRINGE | INTRAVENOUS | Status: AC
  Filled 2021-04-19: qty 10

## 2021-04-19 MED ORDER — OXYCODONE HCL 5 MG/5ML PO SOLN: 5.0000 mg | Freq: Once | ORAL | Status: AC | PRN

## 2021-04-19 MED ORDER — AMISULPRIDE (ANTIEMETIC) 5 MG/2ML IV SOLN: 10.0000 mg | Freq: Once | INTRAVENOUS | Status: DC | PRN

## 2021-04-19 MED ORDER — PHENYLEPHRINE 40 MCG/ML (10ML) SYRINGE FOR IV PUSH (FOR BLOOD PRESSURE SUPPORT)
PREFILLED_SYRINGE | INTRAVENOUS | Status: AC
  Filled 2021-04-19: qty 10

## 2021-04-19 MED ORDER — OXYCODONE-ACETAMINOPHEN 5-325 MG PO TABS: 1.0000 | ORAL_TABLET | ORAL | 0 refills | Status: DC | PRN

## 2021-04-19 MED ORDER — CHLORHEXIDINE GLUCONATE CLOTH 2 % EX PADS: 6.0000 | MEDICATED_PAD | Freq: Once | CUTANEOUS | Status: DC

## 2021-04-19 MED ORDER — ROCURONIUM BROMIDE 10 MG/ML (PF) SYRINGE
PREFILLED_SYRINGE | INTRAVENOUS | Status: DC | PRN
  Administered 2021-04-19: 70 mg via INTRAVENOUS

## 2021-04-19 MED ORDER — NALOXONE HCL 0.4 MG/ML IJ SOLN
INTRAMUSCULAR | Status: AC
  Filled 2021-04-19: qty 1

## 2021-04-19 MED ORDER — LIDOCAINE 2% (20 MG/ML) 5 ML SYRINGE
INTRAMUSCULAR | Status: DC | PRN
  Administered 2021-04-19: 100 mg via INTRAVENOUS

## 2021-04-19 MED ORDER — CEFAZOLIN SODIUM-DEXTROSE 2-4 GM/100ML-% IV SOLN
2.0000 g | INTRAVENOUS | Status: AC
  Administered 2021-04-19: 2 g via INTRAVENOUS
  Filled 2021-04-19: qty 100

## 2021-04-19 MED ORDER — HYDROMORPHONE HCL 1 MG/ML IJ SOLN
INTRAMUSCULAR | Status: AC
  Filled 2021-04-19: qty 1

## 2021-04-19 MED ORDER — MIDAZOLAM HCL 2 MG/2ML IJ SOLN
INTRAMUSCULAR | Status: AC
  Filled 2021-04-19: qty 2

## 2021-04-19 MED ORDER — PROPOFOL 10 MG/ML IV BOLUS
INTRAVENOUS | Status: DC | PRN
  Administered 2021-04-19: 200 mg via INTRAVENOUS

## 2021-04-19 MED ORDER — MIDAZOLAM HCL 2 MG/ML PO SYRP: 20.0000 mg | ORAL_SOLUTION | Freq: Once | ORAL | Status: DC

## 2021-04-19 MED ORDER — LIDOCAINE 2% (20 MG/ML) 5 ML SYRINGE
INTRAMUSCULAR | Status: AC
  Filled 2021-04-19: qty 5

## 2021-04-19 MED ORDER — CHLORHEXIDINE GLUCONATE 0.12 % MT SOLN
15.0000 mL | Freq: Once | OROMUCOSAL | Status: AC
  Administered 2021-04-19: 15 mL via OROMUCOSAL
  Filled 2021-04-19: qty 15

## 2021-04-19 MED ORDER — PHENYLEPHRINE 40 MCG/ML (10ML) SYRINGE FOR IV PUSH (FOR BLOOD PRESSURE SUPPORT)
PREFILLED_SYRINGE | INTRAVENOUS | Status: DC | PRN
  Administered 2021-04-19: 120 ug via INTRAVENOUS
  Administered 2021-04-19: 80 ug via INTRAVENOUS

## 2021-04-19 MED ORDER — ARTIFICIAL TEARS OPHTHALMIC OINT
TOPICAL_OINTMENT | OPHTHALMIC | Status: AC
  Filled 2021-04-19: qty 3.5

## 2021-04-19 MED ORDER — ORAL CARE MOUTH RINSE: 15.0000 mL | Freq: Once | OROMUCOSAL | Status: AC

## 2021-04-19 MED ORDER — CELECOXIB 200 MG PO CAPS
400.0000 mg | ORAL_CAPSULE | ORAL | Status: AC
  Administered 2021-04-19: 400 mg via ORAL
  Filled 2021-04-19: qty 2

## 2021-04-19 MED ORDER — ONDANSETRON HCL 4 MG/2ML IJ SOLN
INTRAMUSCULAR | Status: AC
  Filled 2021-04-19: qty 2

## 2021-04-19 MED ORDER — BUPIVACAINE HCL (PF) 0.25 % IJ SOLN
INTRAMUSCULAR | Status: AC
  Filled 2021-04-19: qty 30

## 2021-04-19 MED ORDER — OXYCODONE HCL 5 MG PO TABS
ORAL_TABLET | ORAL | Status: AC
  Filled 2021-04-19: qty 1

## 2021-04-19 MED ORDER — DEXMEDETOMIDINE (PRECEDEX) IN NS 20 MCG/5ML (4 MCG/ML) IV SYRINGE
PREFILLED_SYRINGE | INTRAVENOUS | Status: DC | PRN
  Administered 2021-04-19: 8 ug via INTRAVENOUS

## 2021-04-19 MED ORDER — ONDANSETRON HCL 4 MG/2ML IJ SOLN
INTRAMUSCULAR | Status: DC | PRN
  Administered 2021-04-19: 4 mg via INTRAVENOUS

## 2021-04-19 MED ORDER — FENTANYL CITRATE (PF) 250 MCG/5ML IJ SOLN
INTRAMUSCULAR | Status: AC
  Filled 2021-04-19: qty 5

## 2021-04-19 MED ORDER — LACTATED RINGERS IV SOLN: INTRAVENOUS | Status: DC

## 2021-04-19 MED ORDER — FENTANYL CITRATE (PF) 250 MCG/5ML IJ SOLN
INTRAMUSCULAR | Status: DC | PRN
  Administered 2021-04-19: 100 ug via INTRAVENOUS
  Administered 2021-04-19: 50 ug via INTRAVENOUS
  Administered 2021-04-19: 100 ug via INTRAVENOUS

## 2021-04-19 MED ORDER — SODIUM CHLORIDE 0.9 % IR SOLN
Status: DC | PRN
  Administered 2021-04-19: 1000 mL

## 2021-04-19 MED ORDER — PROPOFOL 10 MG/ML IV BOLUS
INTRAVENOUS | Status: AC
  Filled 2021-04-19: qty 20

## 2021-04-19 MED ORDER — PROMETHAZINE HCL 25 MG/ML IJ SOLN: 6.2500 mg | INTRAMUSCULAR | Status: DC | PRN

## 2021-04-19 MED ORDER — OXYCODONE HCL 5 MG PO TABS
5.0000 mg | ORAL_TABLET | Freq: Once | ORAL | Status: AC | PRN
  Administered 2021-04-19: 5 mg via ORAL

## 2021-04-19 MED ORDER — BUPIVACAINE-EPINEPHRINE (PF) 0.25% -1:200000 IJ SOLN
INTRAMUSCULAR | Status: AC
  Filled 2021-04-19: qty 30

## 2021-04-19 MED ORDER — SCOPOLAMINE 1 MG/3DAYS TD PT72
1.0000 | MEDICATED_PATCH | TRANSDERMAL | Status: DC
  Administered 2021-04-19: 1.5 mg via TRANSDERMAL
  Filled 2021-04-19: qty 1

## 2021-04-19 MED ORDER — HYDROMORPHONE HCL 1 MG/ML IJ SOLN
0.2500 mg | INTRAMUSCULAR | Status: DC | PRN
  Administered 2021-04-19 (×2): 0.5 mg via INTRAVENOUS
  Administered 2021-04-19: 0.25 mg via INTRAVENOUS
  Administered 2021-04-19: 0.5 mg via INTRAVENOUS
  Administered 2021-04-19: 0.25 mg via INTRAVENOUS

## 2021-04-19 MED ORDER — BUPIVACAINE HCL 0.25 % IJ SOLN
INTRAMUSCULAR | Status: DC | PRN
  Administered 2021-04-19: 6 mL

## 2021-04-19 MED ORDER — ACETAMINOPHEN 500 MG PO TABS
1000.0000 mg | ORAL_TABLET | ORAL | Status: AC
  Administered 2021-04-19: 1000 mg via ORAL
  Filled 2021-04-19: qty 2

## 2021-04-19 MED ORDER — DEXMEDETOMIDINE (PRECEDEX) IN NS 20 MCG/5ML (4 MCG/ML) IV SYRINGE
PREFILLED_SYRINGE | INTRAVENOUS | Status: AC
  Filled 2021-04-19: qty 5

## 2021-04-19 MED ORDER — MIDAZOLAM HCL 2 MG/2ML IJ SOLN
INTRAMUSCULAR | Status: DC | PRN
  Administered 2021-04-19: 2 mg via INTRAVENOUS

## 2021-04-19 MED ORDER — 0.9 % SODIUM CHLORIDE (POUR BTL) OPTIME
TOPICAL | Status: DC | PRN
  Administered 2021-04-19: 1000 mL

## 2021-04-19 SURGICAL SUPPLY — 38 items
APPLIER CLIP 5 13 M/L LIGAMAX5 (MISCELLANEOUS) ×2
BAG COUNTER SPONGE SURGICOUNT (BAG) ×2 IMPLANT
CANISTER SUCT 3000ML PPV (MISCELLANEOUS) ×2 IMPLANT
CHLORAPREP W/TINT 26 (MISCELLANEOUS) ×2 IMPLANT
CLIP APPLIE 5 13 M/L LIGAMAX5 (MISCELLANEOUS) ×1 IMPLANT
COVER SURGICAL LIGHT HANDLE (MISCELLANEOUS) ×2 IMPLANT
DERMABOND ADVANCED (GAUZE/BANDAGES/DRESSINGS) ×1
DERMABOND ADVANCED .7 DNX12 (GAUZE/BANDAGES/DRESSINGS) ×1 IMPLANT
ELECT REM PT RETURN 9FT ADLT (ELECTROSURGICAL) ×2
ELECTRODE REM PT RTRN 9FT ADLT (ELECTROSURGICAL) ×1 IMPLANT
GLOVE SRG 8 PF TXTR STRL LF DI (GLOVE) ×1 IMPLANT
GLOVE SURG ENC MOIS LTX SZ7.5 (GLOVE) ×2 IMPLANT
GLOVE SURG UNDER POLY LF SZ8 (GLOVE) ×2
GOWN STRL REUS W/ TWL LRG LVL3 (GOWN DISPOSABLE) ×2 IMPLANT
GOWN STRL REUS W/ TWL XL LVL3 (GOWN DISPOSABLE) ×1 IMPLANT
GOWN STRL REUS W/TWL LRG LVL3 (GOWN DISPOSABLE) ×4
GOWN STRL REUS W/TWL XL LVL3 (GOWN DISPOSABLE) ×2
GRASPER SUT TROCAR 14GX15 (MISCELLANEOUS) ×2 IMPLANT
KIT BASIN OR (CUSTOM PROCEDURE TRAY) ×2 IMPLANT
KIT TURNOVER KIT B (KITS) ×2 IMPLANT
NEEDLE INSUFFLATION 14GA 120MM (NEEDLE) ×2 IMPLANT
NS IRRIG 1000ML POUR BTL (IV SOLUTION) ×2 IMPLANT
PAD ARMBOARD 7.5X6 YLW CONV (MISCELLANEOUS) ×2 IMPLANT
POUCH RETRIEVAL ECOSAC 10 (ENDOMECHANICALS) ×1 IMPLANT
POUCH RETRIEVAL ECOSAC 10MM (ENDOMECHANICALS) ×2
SCISSORS LAP 5X35 DISP (ENDOMECHANICALS) ×2 IMPLANT
SET IRRIG TUBING LAPAROSCOPIC (IRRIGATION / IRRIGATOR) ×2 IMPLANT
SET TUBE SMOKE EVAC HIGH FLOW (TUBING) ×2 IMPLANT
SLEEVE ENDOPATH XCEL 5M (ENDOMECHANICALS) ×4 IMPLANT
SPECIMEN JAR SMALL (MISCELLANEOUS) ×2 IMPLANT
SUT MNCRL AB 4-0 PS2 18 (SUTURE) ×2 IMPLANT
TOWEL GREEN STERILE (TOWEL DISPOSABLE) ×2 IMPLANT
TOWEL GREEN STERILE FF (TOWEL DISPOSABLE) ×2 IMPLANT
TRAY LAPAROSCOPIC MC (CUSTOM PROCEDURE TRAY) ×2 IMPLANT
TROCAR XCEL NON-BLD 11X100MML (ENDOMECHANICALS) ×2 IMPLANT
TROCAR XCEL NON-BLD 5MMX100MML (ENDOMECHANICALS) ×2 IMPLANT
WARMER LAPAROSCOPE (MISCELLANEOUS) ×2 IMPLANT
WATER STERILE IRR 1000ML POUR (IV SOLUTION) ×2 IMPLANT

## 2021-04-19 NOTE — Op Note (Signed)
Patient: Valerie Evans (Jun 10, 1985, [id number])  Date of Surgery: 04/19/2021   Preoperative Diagnosis: CHOLECYSTITIS   Postoperative Diagnosis: CHOLECYSTITIS   Surgical Procedure: LAPAROSCOPIC CHOLECYSTECTOMY:    Operative Team Members:  Surgeon(s) and Role:    * Ramiel Forti, Hyman Hopes, MD - Primary   Anesthesiologist: Mellody Dance, MD CRNA: Mayer Camel, CRNA   Anesthesia: General   Fluids:  Total I/O In: 900 [I.V.:800; IV Piggyback:100] Out: 0   Complications: None  Drains:  none   Specimen:  ID Type Source Tests Collected by Time Destination  1 : Gallbladder Tissue PATH Gallbladder SURGICAL PATHOLOGY Rue Valladares, Hyman Hopes, MD 04/19/2021 0721      Disposition:  PACU - hemodynamically stable.  Plan of Care: Discharge to home after PACU    Indications for Procedure: Valerie Evans is a 35 y.o. female who presented with abdominal pain.  History, physical and imaging was concerning for cholecystitis.  Laparoscopic cholecystectomy was recommended for the patient.  The procedure itself, as well as the risks, benefits and alternatives were discussed with the patient.  Risks discussed included but were not limited to the risk of infection, bleeding, damage to nearby structures, need to convert to open procedure, incisional hernia, bile leak, common bile duct injury and the need for additional procedures or surgeries.  With this discussion complete and all questions answered the patient granted consent to proceed.  Findings: Relatively normal appearing gallbladder.  Infection status: Patient: Private Patient Elective Case Case: Elective Infection Present At Time Of Surgery (PATOS): None   Description of Procedure:   On the date stated above, the patient was taken to the operating room suite and placed in supine positioning.  Sequential compression devices were placed on the lower extremities to prevent blood clots.  General endotracheal anesthesia was induced.  Preoperative antibiotics were given.  The patient's abdomen was prepped and draped in the usual sterile fashion.  A time-out was completed verifying the correct patient, procedure, positioning and equipment needed for the case.  We began by anesthetizing the skin with local anesthetic and then making a 5 mm incision just below the umbilicus.  We dissected through the subcutaneous tissues to the fascia.  The fascia was grasped and elevated using a Kocher clamp.  A Veress needle was inserted into the abdomen and the abdomen was insufflated to 15 mmHg.  A 5 mm trocar was inserted in this position under optical guidance and then the abdomen was inspected.  There was no trauma to the underlying viscera with initial trocar placement.  Any abnormal findings, other than inflammation in the right upper quadrant, are listed above in the findings section.  Three additional trocars were placed, one 12 mm trocar in the subxiphoid position, one 5 mm trocar in the midline epigastric area and one 60mm trocar in the right upper quadrant subcostally.  These were placed under direct vision without any trauma to the underlying viscera.    The patient was then placed in head up, left side down positioning.  The gallbladder was identified and dissected free from its attachments to the omentum allowing the duodenum to fall away.  The infundibulum of the gallbladder was dissected free working laterally to medially.  The cystic duct and cystic artery were dissected free from surrounding connective tissue.  The infundibulum of the gallbladder was dissected off the cystic plate.  A critical view of safety was obtained with the cystic duct and cystic artery being cleared of connective tissues and clearly the only  two structures entering into the gallbladder with the liver clearly visible behind.  Clips were then applied to the cystic duct and cystic artery and then these structures were divided.  The gallbladder was dissected off the cystic  plate, placed in an endocatch bag and removed from the 12 mm subxiphoid port site.  The clips were inspected and appeared effective.  The cystic plate was inspected and hemostasis was obtained using electrocautery.  The operative field was dry on final inspection.  Attention was turned to closure.  The 12 mm subxiphoid port site was closed using a 0-vicryl suture on a fascial suture passer.  The abdomen was desufflated.  The skin was closed using 4-0 monocryl and dermabond.  All sponge and needle counts were correct at the conclusion of the case.    Ivar Drape, MD General, Bariatric, & Minimally Invasive Surgery [facility], [place]

## 2021-04-19 NOTE — Anesthesia Procedure Notes (Signed)
Procedure Name: Intubation Date/Time: 04/19/2021 7:32 AM Performed by: Mayer Camel, CRNA Pre-anesthesia Checklist: Patient identified, Emergency Drugs available, Suction available and Patient being monitored Patient Re-evaluated:Patient Re-evaluated prior to induction Oxygen Delivery Method: Circle System Utilized Preoxygenation: Pre-oxygenation with 100% oxygen Induction Type: IV induction Ventilation: Mask ventilation without difficulty Laryngoscope Size: Miller and 2 Grade View: Grade I Tube type: Oral Tube size: 6.5 mm Number of attempts: 1 Airway Equipment and Method: Stylet and Oral airway Placement Confirmation: ETT inserted through vocal cords under direct vision, positive ETCO2 and breath sounds checked- equal and bilateral Secured at: 21 cm Tube secured with: Tape Dental Injury: Teeth and Oropharynx as per pre-operative assessment
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
## 2021-04-19 NOTE — Transfer of Care (Signed)
Immediate Anesthesia Transfer of Care Note  Patient: Valerie Evans  Procedure(s) Performed: LAPAROSCOPIC CHOLECYSTECTOMY (Abdomen)  Patient Location: PACU  Anesthesia Type:General  Level of Consciousness: drowsy  Airway & Oxygen Therapy: Patient Spontanous Breathing and Patient connected to face mask oxygen  Post-op Assessment: Report given to RN and Post -op Vital signs reviewed and stable  Post vital signs: Reviewed and stable  Last Vitals:  Vitals Value Taken Time  BP 102/57 04/19/21 0832  Temp 36.1 C 04/19/21 0832  Pulse 96 04/19/21 0836  Resp 18 04/19/21 0836  SpO2 100 % 04/19/21 0836  Vitals shown include unvalidated device data.  Last Pain:  Vitals:   04/19/21 0615  TempSrc:   PainSc: 0-No pain         Complications: No notable events documented.

## 2021-04-19 NOTE — Anesthesia Postprocedure Evaluation (Signed)
Anesthesia Post Note  Patient: Valerie Evans  Procedure(s) Performed: LAPAROSCOPIC CHOLECYSTECTOMY (Abdomen)     Patient location during evaluation: PACU Anesthesia Type: General Level of consciousness: awake Pain management: pain level controlled Vital Signs Assessment: post-procedure vital signs reviewed and stable Respiratory status: spontaneous breathing and respiratory function stable Cardiovascular status: stable Postop Assessment: no apparent nausea or vomiting Anesthetic complications: no   No notable events documented.  Last Vitals:  Vitals:   04/19/21 0917 04/19/21 0932  BP: (!) 106/53 99/60  Pulse: 86 83  Resp: 12 15  Temp:  (!) 36.3 C  SpO2: 100% 99%    Last Pain:  Vitals:   04/19/21 0932  TempSrc:   PainSc: 6                  Candra R Adell Panek

## 2021-04-19 NOTE — Discharge Instructions (Signed)
 CHOLECYSTECTOMY POST OPERATIVE INSTRUCTIONS  Thinking Clearly  The anesthesia may cause you to feel different for 1 or 2 days. Do not drive, drink alcohol, or make any big decisions for at least 2 days.  Nutrition When you wake up, you will be able to drink small amounts of liquid. If you do not feel sick, you can slowly advance your diet to regular foods. Continue to drink lots of fluids, usually about 8 to 10 glasses per day. Eat a high-fiber diet so you don't strain during bowel movements. High-Fiber Foods Foods high in fiber include beans, bran cereals and whole-grain breads, peas, dried fruit (figs, apricots, and dates), raspberries, blackberries, strawberries, sweet corn, broccoli, baked potatoes with skin, plums, pears, apples, greens, and nuts. Activity Slowly increase your activity. Be sure to get up and walk every hour or so to prevent blood clots. No heavy lifting or strenuous activity for 4 weeks following surgery to prevent hernias at your incision sites It is normal to feel tired. You may need more sleep than usual.  Get your rest but make sure to get up and move around frequently to prevent blood clots and pneumonia.  Work and Return to School You can go back to work when you feel well enough. Discuss the timing with your surgeon. You can usually go back to school or work 1 week after an operation. If your work requires heavy lifting or strenuous activity you need to be placed on light duty for 4 weeks following surgery. You can return to gym class, sports or other physical activities 4 weeks after surgery.  Wound Care Always wash your hands before and after touching near your incision site. Do not soak in a bathtub until cleared at your follow up appointment. You may take a shower 24 hours after surgery. A small amount of drainage from the incision is normal. If the drainage is thick and yellow or the site is red, you may have an infection, so call your surgeon. If you  have a drain in one of your incisions, it will be taken out in office when the drainage stops. Steri-Strips will fall off in 7 to 10 days or they will be removed during your first office visit. If you have dermabond glue covering over the incision, allow the glue to flake off on its own. Avoid wearing tight or rough clothing. It may rub your incisions and make it harder for them to heal. Protect the new skin, especially from the sun. The sun can burn and cause darker scarring. Your scar will heal in about 4 to 6 weeks and will become softer and continue to fade over the next year.  The cosmetic appearance of the incisions will improve over the course of the first year after surgery. Sensation around your incision will return in a few weeks or months.  Bowel Movements After intestinal surgery, you may have loose watery stools for several days. If watery diarrhea lasts longer than 3 days, contact your surgeon. Pain medication (narcotics) can cause constipation. Increase the fiber in your diet with high-fiber foods if you are constipated. You can take an over the counter stool softener like Colace to avoid constipation.  Additional over the counter medications can also be used if Colace isn't sufficient (for example, Milk of Magnesia or Miralax).  Pain The amount of pain is different for each person. Some people need only 1 to 3 doses of pain control medication, while others need more. Take alternating doses of tylenol   and ibuprofen around the clock for the first five days following surgery.  This will provide a baseline of pain control and help with inflammation.  Take the narcotic pain medication in addition if needed for severe pain.  Contact Your Surgeon at [phone], if you have: Pain in your right upper abdomen like a gallbladder attack. Pain that will not go away Pain that gets worse A fever of more than 101F (38.3C) Repeated vomiting Swelling, redness, bleeding, or bad-smelling  drainage from your wound site Strong abdominal pain No bowel movement or unable to pass gas for 3 days Watery diarrhea lasting longer than 3 days  Pain Control The goal of pain control is to minimize pain, keep you moving and help you heal. Your surgical team will work with you on your pain plan. Most often a combination of therapies and medications are used to control your pain. You may also be given medication (local anesthetic) at the surgical site. This may help control your pain for several days. Extreme pain puts extra stress on your body at a time when your body needs to focus on healing. Do not wait until your pain has reached a level "10" or is unbearable before telling your doctor or nurse. It is much easier to control pain before it becomes severe. Following a laparoscopic procedure, pain is sometimes felt in the shoulder. This is due to the gas inserted into your abdomen during the procedure. Moving and walking helps to decrease the gas and the right shoulder pain.  Use the guide below for ways to manage your post-operative pain. Learn more by going to facs.org/safepaincontrol.  How Intense Is My Pain Common Therapies to Feel Better       I hardly notice my pain, and it does not interfere with my activities.  I notice my pain and it distracts me, but I can still do activities (sitting up, walking, standing).  Non-Medication Therapies  Ice (in a bag, applied over clothing at the surgical site), elevation, rest, meditation, massage, distraction (music, TV, play) walking and mild exercise Splinting the abdomen with pillows +  Non-Opioid Medications Acetaminophen (Tylenol) Non-steroidal anti-inflammatory drugs (NSAIDS) Aspirin, Ibuprofen (Motrin, Advil) Naproxen (Aleve) Take these as needed, when you feel pain. Both acetaminophen and NSAIDs help to decrease pain and swelling (inflammation).      My pain is hard to ignore and is more noticeable even when I rest.  My  pain interferes with my usual activities.  Non-Medication Therapies  +  Non-Opioid medications  Take on a regular schedule (around-the-clock) instead of as needed. (For example, Tylenol every 6 hours at 9:00 am, 3:00 pm, 9:00 pm, 3:00 am and Motrin every 6 hours at 12:00 am, 6:00 am, 12:00 pm, 6:00 pm)         I am focused on my pain, and I am not doing my daily activities.  I am groaning in pain, and I cannot sleep. I am unable to do anything.  My pain is as bad as it could be, and nothing else matters.  Non-Medication Therapies  +  Around-the-Clock Non-Opioid Medications  +  Short-acting opioids  Opioids should be used with other medications to manage severe pain. Opioids block pain and give a feeling of euphoria (feel high). Addiction, a serious side effect of opioids, is rare with short-term (a few days) use.  Examples of short-acting opioids include: Tramadol (Ultram), Hydrocodone (Norco, Vicodin), Hydromorphone (Dilaudid), Oxycodone (Oxycontin)     The above directions have been adapted from   the American College [employer] Surgical Patient Education Program.  Please refer to the ACS website if needed: https://www.facs.org/-/media/files/education/patient-ed/cholesys.ashx.   Rae Sutcliffe, MD [facility] [address], [address], [place], [place]  [address] ?  [address] [address], [place], [place]   [address] [phone] ? [phone] ? FAX [phone] Web site: [email or web address]  

## 2021-04-20 ENCOUNTER — Encounter (HOSPITAL_COMMUNITY): Payer: Self-pay | Admitting: Surgery

## 2021-04-22 LAB — SURGICAL PATHOLOGY

## 2021-04-23 ENCOUNTER — Other Ambulatory Visit: Payer: Self-pay | Admitting: Physician Assistant

## 2021-05-06 ENCOUNTER — Other Ambulatory Visit: Payer: Self-pay | Admitting: Physician Assistant

## 2021-05-15 ENCOUNTER — Other Ambulatory Visit: Payer: Self-pay | Admitting: Registered Nurse

## 2021-05-15 ENCOUNTER — Encounter: Payer: Self-pay | Admitting: Registered Nurse

## 2021-05-15 DIAGNOSIS — R300 Dysuria: Secondary | ICD-10-CM

## 2021-05-15 MED ORDER — SULFAMETHOXAZOLE-TRIMETHOPRIM 800-160 MG PO TABS: 1.0000 | ORAL_TABLET | Freq: Two times a day (BID) | ORAL | 0 refills | Status: DC

## 2021-06-03 ENCOUNTER — Encounter: Payer: Self-pay | Admitting: Physician Assistant

## 2021-06-03 ENCOUNTER — Ambulatory Visit (INDEPENDENT_AMBULATORY_CARE_PROVIDER_SITE_OTHER): Payer: Self-pay | Admitting: Physician Assistant

## 2021-06-03 ENCOUNTER — Other Ambulatory Visit: Payer: Self-pay | Admitting: Registered Nurse

## 2021-06-03 ENCOUNTER — Other Ambulatory Visit: Payer: Self-pay | Admitting: Physician Assistant

## 2021-06-03 ENCOUNTER — Other Ambulatory Visit: Payer: Self-pay

## 2021-06-03 DIAGNOSIS — F4310 Post-traumatic stress disorder, unspecified: Secondary | ICD-10-CM

## 2021-06-03 DIAGNOSIS — F3341 Major depressive disorder, recurrent, in partial remission: Secondary | ICD-10-CM

## 2021-06-03 DIAGNOSIS — F5105 Insomnia due to other mental disorder: Secondary | ICD-10-CM

## 2021-06-03 DIAGNOSIS — F411 Generalized anxiety disorder: Secondary | ICD-10-CM

## 2021-06-03 DIAGNOSIS — F99 Mental disorder, not otherwise specified: Secondary | ICD-10-CM

## 2021-06-03 MED ORDER — CLONAZEPAM 1 MG PO TABS: 1.0000 mg | ORAL_TABLET | Freq: Three times a day (TID) | ORAL | 5 refills | Status: DC

## 2021-06-03 MED ORDER — QUETIAPINE FUMARATE 300 MG PO TABS: 600.0000 mg | ORAL_TABLET | Freq: Every day | ORAL | 1 refills | Status: DC
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
## 2021-06-04 NOTE — Telephone Encounter (Signed)
Pt response.

## 2021-07-01 ENCOUNTER — Ambulatory Visit: Payer: Self-pay | Admitting: Physician Assistant

## 2021-07-05 ENCOUNTER — Ambulatory Visit (INDEPENDENT_AMBULATORY_CARE_PROVIDER_SITE_OTHER): Payer: 59 | Admitting: Physician Assistant

## 2021-07-05 ENCOUNTER — Encounter: Payer: Self-pay | Admitting: Physician Assistant

## 2021-07-05 ENCOUNTER — Other Ambulatory Visit: Payer: Self-pay

## 2021-07-05 DIAGNOSIS — F411 Generalized anxiety disorder: Secondary | ICD-10-CM | POA: Diagnosis not present

## 2021-07-05 DIAGNOSIS — F4310 Post-traumatic stress disorder, unspecified: Secondary | ICD-10-CM

## 2021-07-05 DIAGNOSIS — F329 Major depressive disorder, single episode, unspecified: Secondary | ICD-10-CM

## 2021-07-05 DIAGNOSIS — F900 Attention-deficit hyperactivity disorder, predominantly inattentive type: Secondary | ICD-10-CM | POA: Diagnosis not present

## 2021-07-05 MED ORDER — QUETIAPINE FUMARATE 300 MG PO TABS: 600.0000 mg | ORAL_TABLET | Freq: Every day | ORAL | 1 refills | Status: DC

## 2021-07-05 NOTE — Progress Notes (Signed)
[facility]  Patient ID: Valerie Evans,  MRN: [id number]  PCP: Janeece Agee, NP  Date of Evaluation: 07/07/2021 Time spent:40 minutes  Chief Complaint:  Chief Complaint   Anxiety; Depression; Insomnia; Follow-up      HISTORY/CURRENT STATUS: HPI for routine med check.  A month ago, Seroquel was increased to 600 mg, but she only went up to 450 mg. Miscommunication. Doesn't feel any better. Still depressed, doesn't enjoy anything, doesn't miss any work "I get through it but that's about it." Energy and motivation are low most of the time. Isolates. Wants to sleep a lot but can't always go to sleep. Not having nightmares or flashbacks like she did. Appetite is nl and weight is stable. No calorie restricting, binge/purge, laxative use. Personal hygiene has decreased except for days she works (part-time.) ADLs are nl. No SI/HI.  Anxious all the time, klonopin doesn't help as much as it did. More of a sense of doom, worries and obsesses about things out of her control. Ruminates, worse in the evenings.   Patient denies increased energy with decreased need for sleep, no increased talkativeness, no racing thoughts, no impulsivity or risky behaviors, no increased spending, no increased libido, no grandiosity, no increased irritability or anger, and no hallucinations.  Review of Systems  Constitutional:  Positive for malaise/fatigue.  Eyes: Negative.   Respiratory: Negative.    Cardiovascular: Negative.   Gastrointestinal: Negative.   Genitourinary: Negative.   Musculoskeletal:        Chronic pain, has lupus  Neurological: Negative.   Endo/Heme/Allergies: Negative.   Psychiatric/Behavioral:         See HPI   Individual Medical History/ Review of Systems: Changes? :No      Past medications for mental health diagnoses include: Cymbalta, Zoloft, Wellbutrin, Xanax, trazodone, Lexapro, Buspar, Elavil, Sonata Klonopin, Gabapentin, Prozac, Rexulti, Abilify, Modafanil,  Seroquel   Allergies: Patient has no known allergies.  Current Medications:  Current Outpatient Medications:    albuterol (VENTOLIN HFA) 108 (90 Base) MCG/ACT inhaler, Inhale 1-2 puffs into the lungs every 6 (six) hours as needed for wheezing or shortness of breath., Disp: 8 g, Rfl: 11   buPROPion (WELLBUTRIN XL) 150 MG 24 hr tablet, Take 3 tablets (450 mg total) by mouth daily., Disp: 270 tablet, Rfl: 1   clonazePAM (KLONOPIN) 1 MG tablet, Take 1 tablet (1 mg total) by mouth in the morning, at noon, and at bedtime. And occas extra 0.5 pill qd., Disp: 105 tablet, Rfl: 5   fluticasone-salmeterol (ADVAIR DISKUS) 250-50 MCG/ACT AEPB, Inhale 1 puff into the lungs in the morning and at bedtime., Disp: 60 each, Rfl: 11   gabapentin (NEURONTIN) 600 MG tablet, Take 1 tablet (600 mg total) by mouth 3 (three) times daily., Disp: 270 tablet, Rfl: 3   hydroxychloroquine (PLAQUENIL) 200 MG tablet, Take 100 mg by mouth daily., Disp: , Rfl:    levothyroxine (SYNTHROID) 25 MCG tablet, TAKE 1 TABLET BY MOUTH EVERY DAY BEFORE BREAKFAST, Disp: 90 tablet, Rfl: 0   magic mouthwash (lidocaine, diphenhydrAMINE, alum & mag hydroxide) suspension, Swish and spit 5 mLs 3 (three) times daily as needed for mouth pain., Disp: 360 mL, Rfl: 2   Multiple Vitamin (MULTIVITAMIN WITH MINERALS) TABS tablet, Take 1 tablet by mouth daily., Disp: , Rfl:    ondansetron (ZOFRAN) 4 MG tablet, Take 8 mg by mouth 2 (two) times daily., Disp: , Rfl:    PREVIDENT 5000 ENAMEL PROTECT 1.1-5 % GEL, Place 1 application onto teeth 2 (two) times  daily., Disp: , Rfl:    traZODone (DESYREL) 100 MG tablet, Take 1 tablet (100 mg total) by mouth at bedtime as needed for sleep. (Patient taking differently: Take 100-200 mg by mouth at bedtime as needed for sleep.), Disp: 180 tablet, Rfl: 1   beclomethasone (QVAR REDIHALER) 80 MCG/ACT inhaler, Inhale 1 puff into the lungs 2 (two) times daily as needed (shortness of breath). (Patient not taking: Reported on  07/05/2021), Disp: 10.6 g, Rfl: 5   oxyCODONE-acetaminophen (PERCOCET) 5-325 MG tablet, Take 1 tablet by mouth every 4 (four) hours as needed for severe pain. (Patient not taking: Reported on 06/03/2021), Disp: 20 tablet, Rfl: 0   QUEtiapine (SEROQUEL) 300 MG tablet, Take 2 tablets (600 mg total) by mouth at bedtime., Disp: 180 tablet, Rfl: 1   sulfamethoxazole-trimethoprim (BACTRIM DS) 800-160 MG tablet, Take 1 tablet by mouth 2 (two) times daily. (Patient not taking: Reported on 06/03/2021), Disp: 6 tablet, Rfl: 0 Medication Side Effects: none  Family Medical/ Social History: Changes? No  MENTAL HEALTH EXAM:  There were no vitals taken for this visit.There is no height or weight on file to calculate BMI.   General Appearance: Casual and Well Groomed   Eye Contact:  Good  Speech:  Clear and Coherent and Normal Rate  Volume:  Normal  Mood:  Depressed  Affect:  Congruent and sad  Thought Process:  Goal Directed and Descriptions of Associations: Circumstantial  Orientation:  Full (Time, Place, and Person)  Thought Content: Logical   Suicidal Thoughts:  No  Homicidal Thoughts:  No  Memory:  WNL  Judgement:  Good  Insight:  Good  Psychomotor Activity:  Normal  Concentration:  Concentration: Good and Attention Span: Good  Recall:  Good  Fund of Knowledge: Good  Language: Good  Assets:  Desire for Improvement  ADL's:  Intact  Cognition: WNL  Prognosis:  Good      DIAGNOSES:    ICD-10-CM   1. Treatment-resistant depression  F32.9     2. Generalized anxiety disorder  F41.1     3. PTSD (post-traumatic stress disorder)  F43.10     4. Attention deficit hyperactivity disorder (ADHD), predominantly inattentive type  F90.0       Receiving Psychotherapy: No    RECOMMENDATIONS:  PDMP was reviewed.  Last Klonopin filled 06/21/2021. I provided 40 minutes of face to face time during this encounter, including time spent before and after the visit in records review, medical decision  making, counseling pertinent to today's visit, and charting.  Long discussion about depression. Consider Genesight testing but pt prefers not to do that at present, especially if insurance doesn't pay, although we don't know that. Explained the benefits, pros and cons. We'll hold off on that. Also TMS. Spravato, ECT. Spravato was my first recommendation of all those so we didn't go into detail about the other 2. Disc benefits, risks, SE, scheduling of treatments, must have driver, etc. All questions were answered. She would like to start it. I've discussed with Traci Scroggins, LPN who takes care of that, she will get the ball rolling. Willa understands it may be several months before it can even be started d/t PA or whatever. I also told her about Dr. Evelene Croon, who provides this treatment as well, she prefers not to go there. In the meantime, will increase Seroquel. No other changes. Cautioned about high dose of klonopin, I expect the Seroquel to help the anxiety, she's already on a high dose of Klonopin so do not want  to increase further. Decrease caffeine, no screen time within 2 hours of bed, need to do gentle exercise daily. Consider increase gabapentin. Increase Seroquel 300 mg, 2 p.o. nightly.   Continue Wellbutrin XL 150 mg, 3 po qd. Continue Klonopin 1 mg, 1 p.o. every morning, 1 at noon, 1 at bedtime, with an occasional 0.5 pills daily, all as needed. Continue gabapentin 600 mg, 1 p.o. 3 times daily. Continue trazodone 100 mg, 1-2 nightly as needed sleep. Needs to be in counseling. Return in 4-6 weeks.  Melony Overly, PA-C

## 2021-07-27 ENCOUNTER — Other Ambulatory Visit: Payer: Self-pay | Admitting: Registered Nurse

## 2021-07-27 DIAGNOSIS — R300 Dysuria: Secondary | ICD-10-CM

## 2021-07-29 NOTE — Telephone Encounter (Signed)
Patient is requesting a refill of the following medications: ?Requested Prescriptions  ? ?Pending Prescriptions Disp Refills  ? sulfamethoxazole-trimethoprim (BACTRIM DS) 800-160 MG tablet [Pharmacy Med Name: SULFAMETHOXAZOLE-TMP DS TABLET] 6 tablet 0  ?  Sig: TAKE 1 TABLET BY MOUTH TWICE A DAY  ? ? ?Date of patient request: 07/27/2021 ?Last office visit: 02/15/2021 ?Date of last refill: 05/15/2021 ?Last refill amount: 6 ?Follow up time period per chart: 08/16/2021 ? ?

## 2021-08-12 ENCOUNTER — Other Ambulatory Visit: Payer: Self-pay

## 2021-08-12 ENCOUNTER — Encounter: Payer: Self-pay | Admitting: Physician Assistant

## 2021-08-12 ENCOUNTER — Ambulatory Visit (INDEPENDENT_AMBULATORY_CARE_PROVIDER_SITE_OTHER): Payer: 59 | Admitting: Physician Assistant

## 2021-08-12 DIAGNOSIS — F4310 Post-traumatic stress disorder, unspecified: Secondary | ICD-10-CM

## 2021-08-12 DIAGNOSIS — F5105 Insomnia due to other mental disorder: Secondary | ICD-10-CM

## 2021-08-12 DIAGNOSIS — F99 Mental disorder, not otherwise specified: Secondary | ICD-10-CM

## 2021-08-12 DIAGNOSIS — F411 Generalized anxiety disorder: Secondary | ICD-10-CM

## 2021-08-12 DIAGNOSIS — F900 Attention-deficit hyperactivity disorder, predominantly inattentive type: Secondary | ICD-10-CM | POA: Diagnosis not present

## 2021-08-12 DIAGNOSIS — F329 Major depressive disorder, single episode, unspecified: Secondary | ICD-10-CM
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
## 2021-08-16 ENCOUNTER — Ambulatory Visit: Payer: 59 | Admitting: Registered Nurse

## 2021-08-19 ENCOUNTER — Encounter: Payer: Self-pay | Admitting: Registered Nurse

## 2021-08-19 ENCOUNTER — Other Ambulatory Visit: Payer: Self-pay

## 2021-08-19 ENCOUNTER — Ambulatory Visit (INDEPENDENT_AMBULATORY_CARE_PROVIDER_SITE_OTHER): Payer: 59 | Admitting: Registered Nurse

## 2021-08-19 VITALS — BP 96/62 | HR 98 | Temp 98.3°F | Resp 18 | Ht 60.0 in | Wt 115.6 lb

## 2021-08-19 DIAGNOSIS — N3944 Nocturnal enuresis: Secondary | ICD-10-CM | POA: Insufficient documentation

## 2021-08-19 DIAGNOSIS — K120 Recurrent oral aphthae: Secondary | ICD-10-CM | POA: Diagnosis not present

## 2021-08-19 DIAGNOSIS — E039 Hypothyroidism, unspecified: Secondary | ICD-10-CM | POA: Diagnosis not present

## 2021-08-19 DIAGNOSIS — S32001D Stable burst fracture of unspecified lumbar vertebra, subsequent encounter for fracture with routine healing: Secondary | ICD-10-CM

## 2021-08-19 DIAGNOSIS — J4520 Mild intermittent asthma, uncomplicated: Secondary | ICD-10-CM | POA: Diagnosis not present

## 2021-08-19 DIAGNOSIS — M329 Systemic lupus erythematosus, unspecified: Secondary | ICD-10-CM | POA: Insufficient documentation

## 2021-08-19 LAB — COMPREHENSIVE METABOLIC PANEL
ALT: 13 U/L (ref 0–35)
AST: 16 U/L (ref 0–37)
Albumin: 4.8 g/dL (ref 3.5–5.2)
Alkaline Phosphatase: 105 U/L (ref 39–117)
BUN: 10 mg/dL (ref 6–23)
CO2: 28 mEq/L (ref 19–32)
Calcium: 10 mg/dL (ref 8.4–10.5)
Chloride: 105 mEq/L (ref 96–112)
Creatinine, Ser: 0.88 mg/dL (ref 0.40–1.20)
GFR: 85.67 mL/min (ref >60.00)
Glucose, Bld: 90 mg/dL (ref 70–99)
Potassium: 4.2 mEq/L (ref 3.5–5.1)
Sodium: 139 mEq/L (ref 135–145)
Total Bilirubin: 0.2 mg/dL (ref 0.2–1.2)
Total Protein: 7.4 g/dL (ref 6.0–8.3)

## 2021-08-19 LAB — URINALYSIS, ROUTINE W REFLEX MICROSCOPIC
Bilirubin Urine: NEGATIVE
Ketones, ur: NEGATIVE
Nitrite: NEGATIVE
RBC / HPF: NONE SEEN (ref 0–?)
Specific Gravity, Urine: <=1.005 — AB (ref 1.000–1.030)
Total Protein, Urine: NEGATIVE
Urine Glucose: NEGATIVE
Urobilinogen, UA: 0.2 (ref 0.0–1.0)
pH: 6.5 (ref 5.0–8.0)

## 2021-08-19 LAB — CBC WITH DIFFERENTIAL/PLATELET
Basophils Absolute: 0.1 10*3/uL (ref 0.0–0.1)
Basophils Relative: 1.5 % (ref 0.0–3.0)
Eosinophils Absolute: 0.4 10*3/uL (ref 0.0–0.7)
Eosinophils Relative: 6.5 % — ABNORMAL HIGH (ref 0.0–5.0)
HCT: 38.9 % (ref 36.0–46.0)
Hemoglobin: 12.9 g/dL (ref 12.0–15.0)
Lymphocytes Relative: 30 % (ref 12.0–46.0)
Lymphs Abs: 1.8 10*3/uL (ref 0.7–4.0)
MCHC: 33.2 g/dL (ref 30.0–36.0)
MCV: 90.6 fl (ref 78.0–100.0)
Monocytes Absolute: 0.5 10*3/uL (ref 0.1–1.0)
Monocytes Relative: 9.4 % (ref 3.0–12.0)
Neutro Abs: 3.1 10*3/uL (ref 1.4–7.7)
Neutrophils Relative %: 52.6 % (ref 43.0–77.0)
Platelets: 249 10*3/uL (ref 150.0–400.0)
RBC: 4.3 Mil/uL (ref 3.87–5.11)
RDW: 14.6 % (ref 11.5–15.5)
WBC: 5.9 10*3/uL (ref 4.0–10.5)

## 2021-08-19 LAB — HEMOGLOBIN A1C: Hgb A1c MFr Bld: 5.4 % (ref 4.6–6.5)

## 2021-08-19 MED ORDER — FLUTICASONE-SALMETEROL 250-50 MCG/ACT IN AEPB: 1.0000 | INHALATION_SPRAY | Freq: Two times a day (BID) | RESPIRATORY_TRACT | 11 refills | Status: DC

## 2021-08-19 MED ORDER — ALBUTEROL SULFATE HFA 108 (90 BASE) MCG/ACT IN AERS: 1.0000 | INHALATION_SPRAY | Freq: Four times a day (QID) | RESPIRATORY_TRACT | 11 refills | Status: DC | PRN

## 2021-08-19 MED ORDER — LIDOCAINE VISCOUS HCL 2 % MT SOLN: 5.0000 mL | Freq: Three times a day (TID) | OROMUCOSAL | 2 refills | Status: DC | PRN

## 2021-08-19 MED ORDER — LEVOTHYROXINE SODIUM 25 MCG PO TABS: ORAL_TABLET | ORAL | 0 refills | Status: DC

## 2021-08-19 NOTE — Assessment & Plan Note (Signed)
New problem. Unclear etiology. She has referral to Dr. Gloriann Loan at [facility] - she will call to schedule. Will refer to neuro in case of neurogenic etiology. Advised on lifestyle measures: low fluid intake in evenings/at night, kegel exercises, and bladder training. Labs collected.  ?

## 2021-08-19 NOTE — Assessment & Plan Note (Signed)
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 She has appt with neurosurgery in April to follow up. Will try to pull sooner if xrays are not looking promising.  ?

## 2021-08-19 NOTE — Patient Instructions (Addendum)
Ms. Valerie Evans - ? ?Great to see you! ? ?Meds refilled ? ?Will check labs ? ?Xrays at [facility] - [address]  ? ?See you in 6 mo, sooner if concerns arise ? ?Thanks! ? ?Rich  ? ?If you have lab work done today you will be contacted with your lab results within the next 2 weeks.  If you have not heard from [place] then please contact us. The fastest way to get your results is to register for My Chart. ? ? ?IF you received an x-ray today, you will receive an invoice from [facility]. Please contact [facility] at [phone] with questions or concerns regarding your invoice.  ? ?IF you received labwork today, you will receive an invoice from [facility]. Please contact [facility] at [phone] with questions or concerns regarding your invoice.  ? ?Our billing staff will not be able to assist you with questions regarding bills from these companies. ? ?You will be contacted with the lab results as soon as they are available. The fastest way to get your results is to activate your My Chart account. Instructions are located on the last page of this paperwork. If you have not heard from [place] regarding the results in 2 weeks, please contact this office. ?  ? ? ?

## 2021-08-19 NOTE — Progress Notes (Signed)
? ?Established Patient Office Visit ? ?Subjective:  ?Patient ID: Valerie Evans, female    DOB: January 24, 1986  Age: 36 y.o. MRN: [id number] ? ?CC:  ?Chief Complaint  ?Patient presents with  ? Follow-up  ?  Patient state she is here for 6 month follow up for asthma.patient states she has been wetting the bed a couple of time and trying to figure out why.  ? ? ?HPI ?Valerie Evans presents for follow up, acute concerns. ? ?Thyroid disease ?Synthroid po qd ?Stable, no AE, hopes to continue. ? ?Asthma ?Stable on current meds. ?No new symptoms. ?Hopes to continue ?Needs refill approaching allergy season. ? ?Acute concern for nocturnal enuresis ?Happens some nights  ?Does take seroquel 600mg  po qhs, good effect as sleep aid.  ?Hx of burst fracture of L2. Has follow up with neuro surg in April. ?No new back injury ?No saddle anesthesia. No incontinence during the day. No fecal incontinence.  ?Recent UTI, treated, no ongoing dysuria.  ? ?Outpatient Medications Prior to Visit  ?Medication Sig Dispense Refill  ? buPROPion (WELLBUTRIN XL) 150 MG 24 hr tablet Take 3 tablets (450 mg total) by mouth daily. 270 tablet 1  ? clonazePAM (KLONOPIN) 1 MG tablet Take 1 tablet (1 mg total) by mouth in the morning, at noon, and at bedtime. And occas extra 0.5 pill qd. 105 tablet 5  ? gabapentin (NEURONTIN) 600 MG tablet Take 1 tablet (600 mg total) by mouth 3 (three) times daily. 270 tablet 3  ? hydroxychloroquine (PLAQUENIL) 200 MG tablet Take 100 mg by mouth daily.    ? Multiple Vitamin (MULTIVITAMIN WITH MINERALS) TABS tablet Take 1 tablet by mouth daily.    ? ondansetron (ZOFRAN) 4 MG tablet Take 8 mg by mouth 2 (two) times daily.    ? PREVIDENT 5000 ENAMEL PROTECT 1.1-5 % GEL Place 1 application onto teeth 2 (two) times daily.    ? QUEtiapine (SEROQUEL) 300 MG tablet Take 2 tablets (600 mg total) by mouth at bedtime. 180 tablet 1  ? traZODone (DESYREL) 100 MG tablet Take 1 tablet (100 mg total) by mouth at bedtime as needed for  sleep. (Patient taking differently: Take 100-200 mg by mouth at bedtime as needed for sleep.) 180 tablet 1  ? albuterol (VENTOLIN HFA) 108 (90 Base) MCG/ACT inhaler Inhale 1-2 puffs into the lungs every 6 (six) hours as needed for wheezing or shortness of breath. 8 g 11  ? fluticasone-salmeterol (ADVAIR DISKUS) 250-50 MCG/ACT AEPB Inhale 1 puff into the lungs in the morning and at bedtime. 60 each 11  ? levothyroxine (SYNTHROID) 25 MCG tablet TAKE 1 TABLET BY MOUTH EVERY DAY BEFORE BREAKFAST 90 tablet 0  ? magic mouthwash (lidocaine, diphenhydrAMINE, alum & mag hydroxide) suspension Swish and spit 5 mLs 3 (three) times daily as needed for mouth pain. 360 mL 2  ? beclomethasone (QVAR REDIHALER) 80 MCG/ACT inhaler Inhale 1 puff into the lungs 2 (two) times daily as needed (shortness of breath). (Patient not taking: Reported on 07/05/2021) 10.6 g 5  ? oxyCODONE-acetaminophen (PERCOCET) 5-325 MG tablet Take 1 tablet by mouth every 4 (four) hours as needed for severe pain. (Patient not taking: Reported on 08/19/2021) 20 tablet 0  ? ?No facility-administered medications prior to visit.  ? ? ?Review of Systems  ?Constitutional: Negative.   ?HENT: Negative.    ?Eyes: Negative.   ?Respiratory: Negative.    ?Cardiovascular: Negative.   ?Gastrointestinal: Negative.   ?Genitourinary: Negative.   ?Musculoskeletal: Negative.   ?Skin:  Negative.   ?Neurological: Negative.   ?Psychiatric/Behavioral: Negative.    ?All other systems reviewed and are negative. ? ?  ?Objective:  ?  ? ?BP 96/62   Pulse 98   Temp 98.3 ?F (36.8 ?C) (Temporal)   Resp 18   Ht 5' (1.524 m)   Wt 115 lb 9.6 oz (52.4 kg)   SpO2 100%   BMI 22.58 kg/m?  ? ?Wt Readings from Last 3 Encounters:  ?08/19/21 115 lb 9.6 oz (52.4 kg)  ?04/19/21 110 lb (49.9 kg)  ?02/15/21 107 lb 3.2 oz (48.6 kg)  ? ?Physical Exam ?Vitals and nursing note reviewed.  ?Constitutional:   ?   General: She is not in acute distress. ?   Appearance: Normal appearance. She is normal weight.  She is not ill-appearing, toxic-appearing or diaphoretic.  ?Cardiovascular:  ?   Rate and Rhythm: Normal rate and regular rhythm.  ?   Heart sounds: Normal heart sounds. No murmur heard. ?  No friction rub. No gallop.  ?Pulmonary:  ?   Effort: Pulmonary effort is normal. No respiratory distress.  ?   Breath sounds: Normal breath sounds. No stridor. No wheezing, rhonchi or rales.  ?Chest:  ?   Chest wall: No tenderness.  ?Skin: ?   General: Skin is warm and dry.  ?Neurological:  ?   General: No focal deficit present.  ?   Mental Status: She is alert and oriented to person, place, and time. Mental status is at baseline.  ?Psychiatric:     ?   Mood and Affect: Mood normal.     ?   Behavior: Behavior normal.     ?   Thought Content: Thought content normal.     ?   Judgment: Judgment normal.  ? ? ?No results found for any visits on 08/19/21. ? ? ? ?The ASCVD Risk score (Arnett DK, et al., 2019) failed to calculate for the following reasons: ?  The 2019 ASCVD risk score is only valid for ages 71 to 1 ? ?  ?Assessment & Plan:  ? ?Problem List Items Addressed This Visit   ? ?  ? Respiratory  ? Mild intermittent asthma without complication  ?  Well controlled. Continue current meds. ?  ?  ? Relevant Medications  ? fluticasone-salmeterol (ADVAIR DISKUS) 250-50 MCG/ACT AEPB  ? albuterol (VENTOLIN HFA) 108 (90 Base) MCG/ACT inhaler  ? Other Relevant Orders  ? CBC with Differential/Platelet  ? Comprehensive metabolic panel  ? Hemoglobin A1c  ?  ? Digestive  ? Canker sores oral  ?  Well controlled. Continue prn use of magic mouthwash ?  ?  ? Relevant Medications  ? magic mouthwash (lidocaine, diphenhydrAMINE, alum & mag hydroxide) suspension  ? Other Relevant Orders  ? CBC with Differential/Platelet  ? Comprehensive metabolic panel  ? Hemoglobin A1c  ?  ? Endocrine  ? Hypothyroidism  ?  Symptomatically stable. Check labs, adjust if warranted.  ?  ?  ? Relevant Medications  ? levothyroxine (SYNTHROID) 25 MCG tablet  ? Other  Relevant Orders  ? TSH  ? T4, free  ? CBC with Differential/Platelet  ? Comprehensive metabolic panel  ? Hemoglobin A1c  ?  ? Musculoskeletal and Integument  ? Burst fracture of lumbar vertebra (HCC)  ?  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 She has appt with neurosurgery in April to follow up. Will try to pull sooner if xrays are not looking promising.  ?  ?  ?  Relevant Orders  ? CBC with Differential/Platelet  ? Comprehensive metabolic panel  ? Hemoglobin A1c  ? DG Hip Unilat W OR W/O Pelvis 2-3 Views Left  ? DG Lumbar Spine Complete  ?  ? Other  ? Nocturnal enuresis - Primary  ?  New problem. Unclear etiology. She has referral to Dr. Bell at [facility] - she willAlvester Morin call to schedule. Will refer to neuro in case of neurogenic etiology. Advised on lifestyle measures: low fluid intake in evenings/at night, kegel exercises, and bladder training. Labs collected.  ?  ?  ? Relevant Orders  ? Urinalysis, Routine w reflex microscopic  ? Urine Culture  ? Ambulatory referral to Neurology  ? CBC with Differential/Platelet  ? Comprehensive metabolic panel  ? Hemoglobin A1c  ? ? ?Meds ordered this encounter  ?Medications  ? fluticasone-salmeterol (ADVAIR DISKUS) 250-50 MCG/ACT AEPB  ?  Sig: Inhale 1 puff into the lungs in the morning and at bedtime.  ?  Dispense:  60 each  ?  Refill:  11  ?  Order Specific Question:   Supervising Provider  ?  Answer:   Neva SeatGREENE, JEFFREY R [[id number]]  ? magic mouthwash (lidocaine, diphenhydrAMINE, alum & mag hydroxide) suspension  ?  Sig: Swish and spit 5 mLs 3 (three) times daily as needed for mouth pain.  ?  Dispense:  360 mL  ?  Refill:  2  ?  1:1:1 ratio of ingredients.  ?  Order Specific Question:   Supervising Provider  ?  Answer:   Neva SeatGREENE, JEFFREY R [[id number]]  ? albuterol (VENTOLIN HFA) 108 (90 Base) MCG/ACT inhaler  ?  Sig: Inhale 1-2 puffs into the lungs every 6 (six) hours as needed for wheezing or shortness of breath.  ?  Dispense:  8 g  ?  Refill:  11   ?  Order Specific Question:   Supervising Provider  ?  Answer:   Neva SeatGREENE, JEFFREY R [[id number]]  ? levothyroxine (SYNTHROID) 25 MCG tablet  ?  Sig: TAKE 1 TABLET BY MOUTH EVERY DAY BEFORE BREAKFAST  ?  Dispense:  90 tabl

## 2021-08-19 NOTE — Assessment & Plan Note (Signed)
Well controlled. Continue prn use of magic mouthwash ?

## 2021-08-19 NOTE — Assessment & Plan Note (Signed)
Symptomatically stable. Check labs, adjust if warranted.  ?

## 2021-08-19 NOTE — Assessment & Plan Note (Signed)
Well controlled. Continue current meds

## 2021-08-19 NOTE — Assessment & Plan Note (Signed)
Stable, follows with rheum ?

## 2021-08-20 LAB — T4, FREE: Free T4: 0.63 ng/dL (ref 0.60–1.60)

## 2021-08-20 LAB — TSH: TSH: 0.97 u[IU]/mL (ref 0.35–5.50)

## 2021-08-21 ENCOUNTER — Encounter: Payer: Self-pay | Admitting: Registered Nurse

## 2021-08-21 ENCOUNTER — Other Ambulatory Visit: Payer: Self-pay | Admitting: Registered Nurse

## 2021-08-21 DIAGNOSIS — N390 Urinary tract infection, site not specified: Secondary | ICD-10-CM

## 2021-08-21 DIAGNOSIS — N3944 Nocturnal enuresis: Secondary | ICD-10-CM

## 2021-08-21 MED ORDER — SULFAMETHOXAZOLE-TRIMETHOPRIM 800-160 MG PO TABS: 1.0000 | ORAL_TABLET | Freq: Two times a day (BID) | ORAL | 0 refills | Status: DC

## 2021-08-22 LAB — URINE CULTURE
MICRO NUMBER:: 13187843
SPECIMEN QUALITY:: ADEQUATE

## 2021-09-05 ENCOUNTER — Telehealth: Payer: Self-pay | Admitting: Physician Assistant

## 2021-09-05 NOTE — Telephone Encounter (Signed)
Will do. I've made myself a note.

## 2021-09-05 NOTE — Telephone Encounter (Signed)
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 Her insurance will cover and I can discuss coverage with her. Can you do a MADRAS depression scale with her on her appt Monday 4/17. This is needed for [place] and the PA process. Thanks. ?

## 2021-09-09 ENCOUNTER — Encounter: Payer: Self-pay | Admitting: Physician Assistant

## 2021-09-09 ENCOUNTER — Ambulatory Visit (INDEPENDENT_AMBULATORY_CARE_PROVIDER_SITE_OTHER): Payer: 59 | Admitting: Physician Assistant

## 2021-09-09 DIAGNOSIS — F4310 Post-traumatic stress disorder, unspecified: Secondary | ICD-10-CM | POA: Diagnosis not present

## 2021-09-09 DIAGNOSIS — F329 Major depressive disorder, single episode, unspecified: Secondary | ICD-10-CM | POA: Diagnosis not present

## 2021-09-09 DIAGNOSIS — G8929 Other chronic pain: Secondary | ICD-10-CM

## 2021-09-09 DIAGNOSIS — F900 Attention-deficit hyperactivity disorder, predominantly inattentive type: Secondary | ICD-10-CM

## 2021-09-09 DIAGNOSIS — F411 Generalized anxiety disorder: Secondary | ICD-10-CM

## 2021-09-09 DIAGNOSIS — F5105 Insomnia due to other mental disorder: Secondary | ICD-10-CM

## 2021-09-09 DIAGNOSIS — F99 Mental disorder, not otherwise specified: Secondary | ICD-10-CM

## 2021-09-09 NOTE — Progress Notes (Signed)
[facility] ? ?Patient ID: Valerie Evans,  ?MRN: [id number] ? ?PCP: Janeece Agee, NP ? ?Date of Evaluation: 09/09/2021 ?Time spent:30 minutes ? ?Chief Complaint:  ?Chief Complaint   ?Anxiety; Depression; Insomnia; Follow-up ?  ? ? ?HISTORY/CURRENT STATUS: ?HPI for routine med check. ?  ?Still has depression, but 'not worse.' Seroquel was increased a few months ago, which did help mood then and it hasn't regressed. Still enjoys watching tv on her days off. Has let friendships go though, doesn't have many friends now. "They don't understand what I go through." No missed work days, at [employer]. Appetite is the same. Sleeps ok. States that attention is good without easy distractibility.  Able to focus on things and finish tasks to completion. Anxiety is still a prob unless takes Klonopin. Will have PA if not. Also has GAD w/ a sense of unease if doesn't take it. ADLs and personal hygiene are nl. Not crying easily. No SI/HI. ? ?Patient denies increased energy with decreased need for sleep, no increased talkativeness, no racing thoughts, no impulsivity or risky behaviors, no increased spending, no increased libido, no grandiosity, no increased irritability or anger, no paranoia, and no hallucinations. ? ?Denies dizziness, syncope, seizures, numbness, tingling, tremor, tics, unsteady gait, slurred speech, confusion. Denies muscle or joint pain, stiffness, or dystonia. ? ?Individual Medical History/ Review of Systems: Changes? :No     ? ?Past medications for mental health diagnoses include: ?Cymbalta, Zoloft, Wellbutrin, Xanax, trazodone, Lexapro, Buspar, Elavil, Sonata Klonopin, Gabapentin, Prozac, Rexulti, Abilify, Modafanil, Seroquel ? ? ?Allergies: Patient has no known allergies. ? ?Current Medications:  ?Current Outpatient Medications:  ?  albuterol (VENTOLIN HFA) 108 (90 Base) MCG/ACT inhaler, Inhale 1-2 puffs into the lungs every 6 (six) hours as needed for wheezing or shortness of breath., Disp: 8  g, Rfl: 11 ?  buPROPion (WELLBUTRIN XL) 150 MG 24 hr tablet, Take 3 tablets (450 mg total) by mouth daily., Disp: 270 tablet, Rfl: 1 ?  clonazePAM (KLONOPIN) 1 MG tablet, Take 1 tablet (1 mg total) by mouth in the morning, at noon, and at bedtime. And occas extra 0.5 pill qd., Disp: 105 tablet, Rfl: 5 ?  fluticasone-salmeterol (ADVAIR DISKUS) 250-50 MCG/ACT AEPB, Inhale 1 puff into the lungs in the morning and at bedtime., Disp: 60 each, Rfl: 11 ?  gabapentin (NEURONTIN) 600 MG tablet, Take 1 tablet (600 mg total) by mouth 3 (three) times daily., Disp: 270 tablet, Rfl: 3 ?  hydroxychloroquine (PLAQUENIL) 200 MG tablet, Take 100 mg by mouth daily., Disp: , Rfl:  ?  levothyroxine (SYNTHROID) 25 MCG tablet, TAKE 1 TABLET BY MOUTH EVERY DAY BEFORE BREAKFAST, Disp: 90 tablet, Rfl: 0 ?  magic mouthwash (lidocaine, diphenhydrAMINE, alum & mag hydroxide) suspension, Swish and spit 5 mLs 3 (three) times daily as needed for mouth pain., Disp: 360 mL, Rfl: 2 ?  Multiple Vitamin (MULTIVITAMIN WITH MINERALS) TABS tablet, Take 1 tablet by mouth daily., Disp: , Rfl:  ?  ondansetron (ZOFRAN) 4 MG tablet, Take 8 mg by mouth 2 (two) times daily., Disp: , Rfl:  ?  PREVIDENT 5000 ENAMEL PROTECT 1.1-5 % GEL, Place 1 application onto teeth 2 (two) times daily., Disp: , Rfl:  ?  QUEtiapine (SEROQUEL) 300 MG tablet, Take 2 tablets (600 mg total) by mouth at bedtime., Disp: 180 tablet, Rfl: 1 ?  traZODone (DESYREL) 100 MG tablet, Take 1 tablet (100 mg total) by mouth at bedtime as needed for sleep. (Patient taking differently: Take 100-200 mg by mouth  at bedtime as needed for sleep.), Disp: 180 tablet, Rfl: 1 ?Medication Side Effects: none ? ?Family Medical/ Social History: Changes? No ? ?MENTAL HEALTH EXAM: ? ?There were no vitals taken for this visit.There is no height or weight on file to calculate BMI.   ?General Appearance: Casual and Well Groomed   ?Eye Contact:  Good  ?Speech:  Clear and Coherent and Normal Rate  ?Volume:  Normal   ?Mood:   sad  ?Affect:  Congruent  ?Thought Process:  Goal Directed and Descriptions of Associations: Circumstantial  ?Orientation:  Full (Time, Place, and Person)  ?Thought Content: Logical   ?Suicidal Thoughts:  No  ?Homicidal Thoughts:  No  ?Memory:  WNL  ?Judgement:  Good  ?Insight:  Good  ?Psychomotor Activity:  Normal  ?Concentration:  Concentration: Good and Attention Span: Good  ?Recall:  Good  ?Fund of Knowledge: Good  ?Language: Good  ?Assets:  Desire for Improvement  ?ADL's:  Intact  ?Cognition: WNL  ?Prognosis:  Good  ? ? ? ?CAGE-AID   ? ?Flowsheet Row ED to Hosp-Admission (Discharged) from 05/08/2020 in [facility]  ?CAGE-AID Score 0  ? ?  ? ?ECT-MADRS   ? ?Flowsheet Row Office Visit from 09/09/2021 in [facility]  ?MADRS Total Score 40  ? ?  ? ?GAD-7   ? ?Flowsheet Row Office Visit from 03/04/2018 in [facility]  ?Total GAD-7 Score 7  ? ?  ? ?PHQ2-9   ? ?Flowsheet Row Office Visit from 08/19/2021 in [facility] Office Visit from 02/15/2021 in [facility] Office Visit from 10/25/2019 in [facility] Visit from 09/07/2019 in [facility] Office Visit from 03/04/2018 in [facility]  ?PHQ-2 Total Score 3 2 0 2 2  ?PHQ-9 Total Score 13 11 0 2 12  ? ?  ? ?Flowsheet Row Admission (Discharged) from 04/19/2021 in [facility]  ?C-SSRS RISK CATEGORY No Risk  ? ?  ? ? ?DIAGNOSES:  ?  ICD-10-CM   ?1. Treatment-resistant depression  F32.9   ?  ?2. Generalized anxiety disorder  F41.1   ?  ?3. PTSD (post-traumatic stress disorder)  F43.10   ?  ?4. Attention deficit hyperactivity disorder (ADHD), predominantly inattentive type  F90.0   ?  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 F99   ?  ?6. Other chronic pain  G89.29   ?  ? ? ?Receiving Psychotherapy: No   ? ? ?RECOMMENDATIONS:  ?PDMP was reviewed.  Last Klonopin filled 07/26/2021. ?I provided 30 minutes of face to face time during this encounter, including time spent before and after the visit in records review, medical decision making, counseling pertinent to today's visit, and charting.  ?We're working on Cabin [employer] approved by insurance. Hopefully that will work out, she may have a high deductible though. Not sure yet.  ?In the meantime, no changes will be made.  ? ?Continue Wellbutrin XL 150 mg, 3 po qd. ?Continue Klonopin 1 mg, 1 p.o. every morning, 1 at noon, 1 at bedtime, with an occasional 0.5 pills daily, all as needed. ?Continue gabapentin 600 mg, 1 p.o. 3 times daily. ?Continue Seroquel 300 mg, 2 p.o. nightly.   ?Continue trazodone 100 mg, 1-2 nightly as needed sleep. ?Return in 6 weeks.  ? ?Melony Overly, PA-C  ?

## 2021-10-04 ENCOUNTER — Ambulatory Visit (INDEPENDENT_AMBULATORY_CARE_PROVIDER_SITE_OTHER): Payer: 59

## 2021-10-04 ENCOUNTER — Encounter: Payer: Self-pay | Admitting: Registered Nurse

## 2021-10-04 DIAGNOSIS — M545 Low back pain, unspecified: Secondary | ICD-10-CM

## 2021-10-04 DIAGNOSIS — M25552 Pain in left hip: Secondary | ICD-10-CM | POA: Diagnosis not present

## 2021-10-04 DIAGNOSIS — S32001D Stable burst fracture of unspecified lumbar vertebra, subsequent encounter for fracture with routine healing: Secondary | ICD-10-CM

## 2021-10-07 NOTE — Telephone Encounter (Signed)
Looks like the office she was referred to does not accept this dx, is there a good way to find out who does? ? ?Thanks, ? ?Rich

## 2021-10-08 NOTE — Telephone Encounter (Signed)
I have sent the referral to [facility] to see if they can assist with the referral.  ?

## 2021-10-08 NOTE — Telephone Encounter (Signed)
Thank you Rich!

## 2021-10-15 ENCOUNTER — Ambulatory Visit (INDEPENDENT_AMBULATORY_CARE_PROVIDER_SITE_OTHER): Payer: 59 | Admitting: Registered Nurse

## 2021-10-15 ENCOUNTER — Encounter: Payer: Self-pay | Admitting: Registered Nurse

## 2021-10-15 VITALS — BP 102/70 | HR 85 | Temp 98.0°F | Resp 16 | Ht 61.0 in | Wt 114.0 lb

## 2021-10-15 DIAGNOSIS — M545 Low back pain, unspecified: Secondary | ICD-10-CM

## 2021-10-15 DIAGNOSIS — R110 Nausea: Secondary | ICD-10-CM

## 2021-10-15 DIAGNOSIS — G8929 Other chronic pain: Secondary | ICD-10-CM

## 2021-10-15 DIAGNOSIS — S32001D Stable burst fracture of unspecified lumbar vertebra, subsequent encounter for fracture with routine healing: Secondary | ICD-10-CM

## 2021-10-15 MED ORDER — OXYCODONE-ACETAMINOPHEN 5-325 MG PO TABS: 1.0000 | ORAL_TABLET | Freq: Two times a day (BID) | ORAL | 0 refills | Status: AC | PRN

## 2021-10-15 MED ORDER — ONDANSETRON 8 MG PO TBDP: 8.0000 mg | ORAL_TABLET | Freq: Two times a day (BID) | ORAL | 11 refills | Status: DC | PRN

## 2021-10-15 NOTE — Patient Instructions (Addendum)
Ms. Leedom -   Randie Heinz to see you.  I recommend Yoga with Adriene for a good, low key exercise routine.  See you in a few weeks to stay update on things  Thanks,   Rich

## 2021-10-15 NOTE — Progress Notes (Signed)
Established Patient Office Visit  Subjective:  Patient ID: Valerie Evans, female    DOB: 1986-03-07  Age: 36 y.o. MRN: [id number]  CC:  Chief Complaint  Patient presents with   Hip Pain    F/U on left hip pain Doing better     HPI Valerie Evans presents for hip pain  Doing better.  Recent xrays showed stability.   She has seen rheumatology recently - she follows with Dr. Dierdre Forth. Received steroid shot and low dose of prednisone Has helped. Prednisone given in response to elevated CK on labs  She did have a morning of intense pain on waking - could not move any joint without having pain. She continues to have intermittent pains that are severe at times - hip and lower back, mostly. Has been on opioids with good effect in the past. Interested in seeing pain management for chronic management or concerns.  Outpatient Medications Prior to Visit  Medication Sig Dispense Refill   albuterol (VENTOLIN HFA) 108 (90 Base) MCG/ACT inhaler Inhale 1-2 puffs into the lungs every 6 (six) hours as needed for wheezing or shortness of breath. 8 g 11   buPROPion (WELLBUTRIN XL) 150 MG 24 hr tablet Take 3 tablets (450 mg total) by mouth daily. 270 tablet 1   clonazePAM (KLONOPIN) 1 MG tablet Take 1 tablet (1 mg total) by mouth in the morning, at noon, and at bedtime. And occas extra 0.5 pill qd. 105 tablet 5   fluticasone-salmeterol (ADVAIR DISKUS) 250-50 MCG/ACT AEPB Inhale 1 puff into the lungs in the morning and at bedtime. 60 each 11   gabapentin (NEURONTIN) 600 MG tablet Take 1 tablet (600 mg total) by mouth 3 (three) times daily. 270 tablet 3   hydroxychloroquine (PLAQUENIL) 200 MG tablet Take 100 mg by mouth daily.     levothyroxine (SYNTHROID) 25 MCG tablet TAKE 1 TABLET BY MOUTH EVERY DAY BEFORE BREAKFAST 90 tablet 0   Multiple Vitamin (MULTIVITAMIN WITH MINERALS) TABS tablet Take 1 tablet by mouth daily.     PREVIDENT 5000 ENAMEL PROTECT 1.1-5 % GEL Place 1 application onto teeth 2  (two) times daily.     QUEtiapine (SEROQUEL) 300 MG tablet Take 2 tablets (600 mg total) by mouth at bedtime. 180 tablet 1   traZODone (DESYREL) 100 MG tablet Take 1 tablet (100 mg total) by mouth at bedtime as needed for sleep. (Patient taking differently: Take 100-200 mg by mouth at bedtime as needed for sleep.) 180 tablet 1   ondansetron (ZOFRAN) 4 MG tablet Take 8 mg by mouth 2 (two) times daily.     magic mouthwash (lidocaine, diphenhydrAMINE, alum & mag hydroxide) suspension Swish and spit 5 mLs 3 (three) times daily as needed for mouth pain. (Patient not taking: Reported on 10/15/2021) 360 mL 2   No facility-administered medications prior to visit.    Review of Systems  Constitutional: Negative.   HENT: Negative.    Eyes: Negative.   Respiratory: Negative.    Cardiovascular: Negative.   Gastrointestinal: Negative.   Genitourinary: Negative.   Musculoskeletal:  Positive for arthralgias and back pain.  Skin: Negative.   Neurological: Negative.   Psychiatric/Behavioral: Negative.    All other systems reviewed and are negative.    Objective:     BP 102/70   Pulse 85   Temp 98 F (36.7 C) (Temporal)   Resp 16   Ht 5\' 1"  (1.549 m)   Wt 114 lb (51.7 kg)   LMP 09/23/2021 (Approximate)  SpO2 98%   BMI 21.54 kg/m   Wt Readings from Last 3 Encounters:  10/15/21 114 lb (51.7 kg)  08/19/21 115 lb 9.6 oz (52.4 kg)  04/19/21 110 lb (49.9 kg)   Physical Exam Vitals and nursing note reviewed.  Constitutional:      General: She is not in acute distress.    Appearance: Normal appearance. She is normal weight. She is not ill-appearing, toxic-appearing or diaphoretic.  Cardiovascular:     Rate and Rhythm: Normal rate and regular rhythm.     Heart sounds: Normal heart sounds. No murmur heard.   No friction rub. No gallop.  Pulmonary:     Effort: Pulmonary effort is normal. No respiratory distress.     Breath sounds: Normal breath sounds. No stridor. No wheezing, rhonchi or  rales.  Chest:     Chest wall: No tenderness.  Skin:    General: Skin is warm and dry.  Neurological:     General: No focal deficit present.     Mental Status: She is alert and oriented to person, place, and time. Mental status is at baseline.  Psychiatric:        Mood and Affect: Mood normal.        Behavior: Behavior normal.        Thought Content: Thought content normal.        Judgment: Judgment normal.    No results found for any visits on 10/15/21.    The ASCVD Risk score (Arnett DK, et al., 2019) failed to calculate for the following reasons:   The 2019 ASCVD risk score is only valid for ages 82 to 46    Assessment & Plan:   Problem List Items Addressed This Visit       Musculoskeletal and Integument   Burst fracture of lumbar vertebra (HCC) - Primary   Relevant Medications   oxyCODONE-acetaminophen (PERCOCET/ROXICET) 5-325 MG tablet   Other Relevant Orders   Ambulatory referral to Pain Clinic     Other   Low back pain, unspecified   Relevant Medications   oxyCODONE-acetaminophen (PERCOCET/ROXICET) 5-325 MG tablet   Other Relevant Orders   Ambulatory referral to Pain Clinic   Other Visit Diagnoses     Nausea without vomiting       Relevant Medications   ondansetron (ZOFRAN-ODT) 8 MG disintegrating tablet       Meds ordered this encounter  Medications   oxyCODONE-acetaminophen (PERCOCET/ROXICET) 5-325 MG tablet    Sig: Take 1 tablet by mouth 2 (two) times daily as needed for up to 5 days for severe pain.    Dispense:  10 tablet    Refill:  0    Order Specific Question:   Supervising Provider    Answer:   Neva Seat, JEFFREY R [[id number]]   ondansetron (ZOFRAN-ODT) 8 MG disintegrating tablet    Sig: Take 1 tablet (8 mg total) by mouth 2 (two) times daily as needed for nausea or vomiting.    Dispense:  60 tablet    Refill:  11    Order Specific Question:   Supervising Provider    Answer:   Neva Seat, JEFFREY R [[id number]]    Return in about 6 weeks (around  11/26/2021) for Chronic Conditions.   PLAN Will give short course of oxycodone-acetaminophen for breakthrough pain, but refer to pain clinic for ongoing management. Refill zofran Patient encouraged to call clinic with any questions, comments, or concerns.  Valerie Agee, NP

## 2021-11-02 ENCOUNTER — Other Ambulatory Visit: Payer: Self-pay | Admitting: Physician Assistant

## 2021-11-04 ENCOUNTER — Encounter: Payer: Self-pay | Admitting: Physician Assistant

## 2021-11-04 ENCOUNTER — Telehealth (INDEPENDENT_AMBULATORY_CARE_PROVIDER_SITE_OTHER): Payer: 59 | Admitting: Physician Assistant

## 2021-11-04 DIAGNOSIS — F5105 Insomnia due to other mental disorder: Secondary | ICD-10-CM

## 2021-11-04 DIAGNOSIS — F329 Major depressive disorder, single episode, unspecified: Secondary | ICD-10-CM

## 2021-11-04 DIAGNOSIS — G8929 Other chronic pain: Secondary | ICD-10-CM

## 2021-11-04 DIAGNOSIS — F411 Generalized anxiety disorder: Secondary | ICD-10-CM | POA: Diagnosis not present

## 2021-11-04 DIAGNOSIS — F4310 Post-traumatic stress disorder, unspecified: Secondary | ICD-10-CM | POA: Diagnosis not present

## 2021-11-04 DIAGNOSIS — F900 Attention-deficit hyperactivity disorder, predominantly inattentive type: Secondary | ICD-10-CM

## 2021-11-04 DIAGNOSIS — F99 Mental disorder, not otherwise specified: Secondary | ICD-10-CM

## 2021-11-04 MED ORDER — BUPROPION HCL ER (XL) 150 MG PO TB24: 450.0000 mg | ORAL_TABLET | Freq: Every day | ORAL | 1 refills | Status: DC

## 2021-11-04 MED ORDER — TRAZODONE HCL 100 MG PO TABS: 100.0000 mg | ORAL_TABLET | Freq: Every evening | ORAL | 1 refills | Status: DC | PRN

## 2021-11-04 MED ORDER — QUETIAPINE FUMARATE 400 MG PO TABS: 800.0000 mg | ORAL_TABLET | Freq: Every day | ORAL | 1 refills | Status: DC
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
## 2021-11-13 ENCOUNTER — Encounter: Payer: Self-pay | Admitting: Registered Nurse

## 2021-11-13 MED ORDER — MUPIROCIN 2 % EX OINT: 1.0000 | TOPICAL_OINTMENT | Freq: Two times a day (BID) | CUTANEOUS | 0 refills | Status: DC

## 2021-11-15 ENCOUNTER — Telehealth: Payer: 59 | Admitting: Physician Assistant

## 2021-11-15 DIAGNOSIS — R3989 Other symptoms and signs involving the genitourinary system: Secondary | ICD-10-CM | POA: Diagnosis not present

## 2021-11-15 MED ORDER — SULFAMETHOXAZOLE-TRIMETHOPRIM 800-160 MG PO TABS: 1.0000 | ORAL_TABLET | Freq: Two times a day (BID) | ORAL | 0 refills | Status: DC

## 2021-11-26 ENCOUNTER — Other Ambulatory Visit: Payer: Self-pay | Admitting: Physician Assistant

## 2021-11-28 ENCOUNTER — Ambulatory Visit: Payer: 59 | Admitting: Registered Nurse

## 2021-12-02 ENCOUNTER — Ambulatory Visit: Payer: 59 | Admitting: Registered Nurse

## 2021-12-09 ENCOUNTER — Other Ambulatory Visit: Payer: Self-pay | Admitting: Physician Assistant

## 2021-12-09 ENCOUNTER — Encounter: Payer: Self-pay | Admitting: Physician Assistant

## 2021-12-09 ENCOUNTER — Telehealth (INDEPENDENT_AMBULATORY_CARE_PROVIDER_SITE_OTHER): Payer: 59 | Admitting: Physician Assistant

## 2021-12-09 DIAGNOSIS — F5105 Insomnia due to other mental disorder: Secondary | ICD-10-CM | POA: Diagnosis not present

## 2021-12-09 DIAGNOSIS — F329 Major depressive disorder, single episode, unspecified: Secondary | ICD-10-CM

## 2021-12-09 DIAGNOSIS — F4310 Post-traumatic stress disorder, unspecified: Secondary | ICD-10-CM | POA: Diagnosis not present

## 2021-12-09 DIAGNOSIS — F411 Generalized anxiety disorder: Secondary | ICD-10-CM | POA: Diagnosis not present

## 2021-12-09 DIAGNOSIS — F99 Mental disorder, not otherwise specified: Secondary | ICD-10-CM

## 2021-12-09 DIAGNOSIS — G8929 Other chronic pain: Secondary | ICD-10-CM

## 2021-12-09 DIAGNOSIS — F900 Attention-deficit hyperactivity disorder, predominantly inattentive type: Secondary | ICD-10-CM

## 2021-12-09 MED ORDER — AUVELITY 45-105 MG PO TBCR: 45.0000 mg | EXTENDED_RELEASE_TABLET | Freq: Two times a day (BID) | ORAL | 1 refills | Status: DC

## 2021-12-09 MED ORDER — CLONAZEPAM 1 MG PO TABS
1.0000 mg | ORAL_TABLET | Freq: Three times a day (TID) | ORAL | 5 refills | Status: DC
Start: 2021-12-09 — End: 2022-06-11

## 2021-12-09 NOTE — Telephone Encounter (Signed)
No current pharmacy insurance will get information to submit.
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
## 2021-12-09 NOTE — Telephone Encounter (Signed)
Will submit PA

## 2021-12-09 NOTE — Telephone Encounter (Signed)
PA needed

## 2021-12-11 NOTE — Telephone Encounter (Signed)
Submitted a prior authorization for her AUVELITY 45-105 MG but unfortunately it is not covered by her plan.  She is with [employer], they are unable to review since not on her formulary.    She can still use the co-pay card whether it is approved or not though.

## 2021-12-12 NOTE — Telephone Encounter (Signed)
Please let her know insurance will not pay at this point but we can get her started on the samples like you suggested.  Since her insurance will be changing in August and then will know if she is responding or not and go from there. Thank you

## 2021-12-23 ENCOUNTER — Ambulatory Visit (INDEPENDENT_AMBULATORY_CARE_PROVIDER_SITE_OTHER): Payer: 59 | Admitting: Registered Nurse

## 2021-12-23 ENCOUNTER — Encounter: Payer: Self-pay | Admitting: Registered Nurse

## 2021-12-23 VITALS — BP 106/66 | HR 94 | Temp 98.3°F | Resp 18 | Ht 61.0 in | Wt 114.2 lb

## 2021-12-23 DIAGNOSIS — E039 Hypothyroidism, unspecified: Secondary | ICD-10-CM | POA: Diagnosis not present

## 2021-12-23 DIAGNOSIS — R300 Dysuria: Secondary | ICD-10-CM

## 2021-12-23 DIAGNOSIS — J4520 Mild intermittent asthma, uncomplicated: Secondary | ICD-10-CM | POA: Diagnosis not present

## 2021-12-23 DIAGNOSIS — J340 Abscess, furuncle and carbuncle of nose: Secondary | ICD-10-CM

## 2021-12-23 DIAGNOSIS — R110 Nausea: Secondary | ICD-10-CM | POA: Diagnosis not present

## 2021-12-23 DIAGNOSIS — K120 Recurrent oral aphthae: Secondary | ICD-10-CM

## 2021-12-23 DIAGNOSIS — S32001D Stable burst fracture of unspecified lumbar vertebra, subsequent encounter for fracture with routine healing: Secondary | ICD-10-CM

## 2021-12-23 LAB — URINALYSIS WITH CULTURE, IF INDICATED
Bilirubin Urine: NEGATIVE
Hgb urine dipstick: NEGATIVE
Ketones, ur: NEGATIVE
Leukocytes,Ua: NEGATIVE
Nitrite: NEGATIVE
RBC / HPF: NONE SEEN (ref 0–?)
Specific Gravity, Urine: <=1.005 — AB (ref 1.000–1.030)
Total Protein, Urine: NEGATIVE
Urine Glucose: NEGATIVE
Urobilinogen, UA: 0.2 (ref 0.0–1.0)
WBC, UA: NONE SEEN (ref 0–?)
pH: 6 (ref 5.0–8.0)

## 2021-12-23 LAB — CBC WITH DIFFERENTIAL/PLATELET
Basophils Absolute: 0.1 10*3/uL (ref 0.0–0.1)
Basophils Relative: 1.4 % (ref 0.0–3.0)
Eosinophils Absolute: 0.5 10*3/uL (ref 0.0–0.7)
Eosinophils Relative: 8.7 % — ABNORMAL HIGH (ref 0.0–5.0)
HCT: 39.6 % (ref 36.0–46.0)
Hemoglobin: 13 g/dL (ref 12.0–15.0)
Lymphocytes Relative: 40.5 % (ref 12.0–46.0)
Lymphs Abs: 2.4 10*3/uL (ref 0.7–4.0)
MCHC: 32.9 g/dL (ref 30.0–36.0)
MCV: 93.1 fl (ref 78.0–100.0)
Monocytes Absolute: 0.6 10*3/uL (ref 0.1–1.0)
Monocytes Relative: 9.7 % (ref 3.0–12.0)
Neutro Abs: 2.3 10*3/uL (ref 1.4–7.7)
Neutrophils Relative %: 39.7 % — ABNORMAL LOW (ref 43.0–77.0)
Platelets: 230 10*3/uL (ref 150.0–400.0)
RBC: 4.25 Mil/uL (ref 3.87–5.11)
RDW: 13.7 % (ref 11.5–15.5)
WBC: 5.8 10*3/uL (ref 4.0–10.5)

## 2021-12-23 LAB — TSH: TSH: 1.14 u[IU]/mL (ref 0.35–5.50)

## 2021-12-23 MED ORDER — ONDANSETRON 8 MG PO TBDP: 8.0000 mg | ORAL_TABLET | Freq: Two times a day (BID) | ORAL | 11 refills | Status: DC | PRN

## 2021-12-23 MED ORDER — ALBUTEROL SULFATE HFA 108 (90 BASE) MCG/ACT IN AERS: 1.0000 | INHALATION_SPRAY | Freq: Four times a day (QID) | RESPIRATORY_TRACT | 11 refills | Status: DC | PRN

## 2021-12-23 MED ORDER — OXYCODONE-ACETAMINOPHEN 5-325 MG PO TABS: 1.0000 | ORAL_TABLET | ORAL | 0 refills | Status: AC | PRN

## 2021-12-23 MED ORDER — MUPIROCIN 2 % EX OINT: 1.0000 | TOPICAL_OINTMENT | Freq: Two times a day (BID) | CUTANEOUS | 11 refills | Status: AC

## 2021-12-23 MED ORDER — LIDOCAINE VISCOUS HCL 2 % MT SOLN: 5.0000 mL | Freq: Three times a day (TID) | OROMUCOSAL | 2 refills | Status: DC | PRN

## 2021-12-23 MED ORDER — LEVOTHYROXINE SODIUM 25 MCG PO TABS: ORAL_TABLET | ORAL | 1 refills | Status: DC

## 2021-12-23 MED ORDER — FLUTICASONE-SALMETEROL 250-50 MCG/ACT IN AEPB: 1.0000 | INHALATION_SPRAY | Freq: Two times a day (BID) | RESPIRATORY_TRACT | 11 refills | Status: DC

## 2021-12-23 NOTE — Assessment & Plan Note (Signed)
Check urinalysis and treat as indicated.

## 2021-12-23 NOTE — Assessment & Plan Note (Signed)
Refill mupirocin for prn use.

## 2021-12-23 NOTE — Assessment & Plan Note (Signed)
Continue prn magic mouthwash
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
## 2021-12-23 NOTE — Assessment & Plan Note (Signed)
Refill oxycodone-acetaminophen. pdmp consulted, no concerns.

## 2021-12-23 NOTE — Assessment & Plan Note (Signed)
Recheck tsh. Continue synthroid

## 2021-12-23 NOTE — Patient Instructions (Addendum)
Ms. Valerie Evans to see you!  Stay well.  Refills sent  I would recommend one of these providers:  Jarold Motto, PA Jacquiline Doe, MD Glenetta Hew, MD Letta Moynahan Early, NP Jiles Prows, DNP   Thank you for letting me take part in your care,  [name]   If you have lab work done today you will be contacted with your lab results within the next 2 weeks.  If you have not heard from [place] then please contact us. The fastest way to get your results is to register for My Chart.   IF you received an x-ray today, you will receive an invoice from [facility]. Please contact [facility] at [phone] with questions or concerns regarding your invoice.   IF you received labwork today, you will receive an invoice from [facility]. Please contact [facility] at [phone] with questions or concerns regarding your invoice.   Our billing staff will not be able to assist you with questions regarding bills from these companies.  You will be contacted with the lab results as soon as they are available. The fastest way to get your results is to activate your My Chart account. Instructions are located on the last page of this paperwork. If you have not heard from [place] regarding the results in 2 weeks, please contact this office.

## 2021-12-23 NOTE — Assessment & Plan Note (Signed)
Refill zofran for prn use. Discussed side effects.

## 2021-12-23 NOTE — Assessment & Plan Note (Signed)
Continue advair and albuterol.

## 2021-12-24 ENCOUNTER — Other Ambulatory Visit: Payer: Self-pay | Admitting: Registered Nurse

## 2021-12-24 DIAGNOSIS — N390 Urinary tract infection, site not specified: Secondary | ICD-10-CM

## 2021-12-24 LAB — COMPREHENSIVE METABOLIC PANEL
ALT: 11 U/L (ref 0–35)
AST: 17 U/L (ref 0–37)
Albumin: 4.7 g/dL (ref 3.5–5.2)
Alkaline Phosphatase: 89 U/L (ref 39–117)
BUN: 9 mg/dL (ref 6–23)
CO2: 28 mEq/L (ref 19–32)
Calcium: 9.9 mg/dL (ref 8.4–10.5)
Chloride: 104 mEq/L (ref 96–112)
Creatinine, Ser: 0.87 mg/dL (ref 0.40–1.20)
GFR: 86.64 mL/min (ref >60.00)
Glucose, Bld: 86 mg/dL (ref 70–99)
Potassium: 3.8 mEq/L (ref 3.5–5.1)
Sodium: 140 mEq/L (ref 135–145)
Total Bilirubin: 0.2 mg/dL (ref 0.2–1.2)
Total Protein: 7.9 g/dL (ref 6.0–8.3)

## 2021-12-24 MED ORDER — SULFAMETHOXAZOLE-TRIMETHOPRIM 800-160 MG PO TABS: 1.0000 | ORAL_TABLET | Freq: Two times a day (BID) | ORAL | 1 refills | Status: DC

## 2021-12-27 NOTE — Telephone Encounter (Signed)
We are checking into her new insurance. Pt can have samples as long as doing well on medication.

## 2022-01-15 DIAGNOSIS — R1012 Left upper quadrant pain: Secondary | ICD-10-CM | POA: Diagnosis not present

## 2022-01-15 DIAGNOSIS — Z6822 Body mass index (BMI) 22.0-22.9, adult: Secondary | ICD-10-CM | POA: Diagnosis not present

## 2022-01-15 DIAGNOSIS — R079 Chest pain, unspecified: Secondary | ICD-10-CM | POA: Diagnosis not present

## 2022-01-15 DIAGNOSIS — M25562 Pain in left knee: Secondary | ICD-10-CM | POA: Diagnosis not present

## 2022-01-15 DIAGNOSIS — M25561 Pain in right knee: Secondary | ICD-10-CM | POA: Diagnosis not present

## 2022-01-15 DIAGNOSIS — M797 Fibromyalgia: Secondary | ICD-10-CM | POA: Diagnosis not present

## 2022-01-15 DIAGNOSIS — M329 Systemic lupus erythematosus, unspecified: Secondary | ICD-10-CM | POA: Diagnosis not present

## 2022-01-15 DIAGNOSIS — M545 Low back pain, unspecified: Secondary | ICD-10-CM | POA: Diagnosis not present

## 2022-01-15 DIAGNOSIS — M25552 Pain in left hip: Secondary | ICD-10-CM | POA: Diagnosis not present

## 2022-01-15 DIAGNOSIS — R110 Nausea: Secondary | ICD-10-CM | POA: Diagnosis not present

## 2022-01-16 NOTE — Telephone Encounter (Signed)
Pt hasn't called back as far as I am aware.

## 2022-01-16 NOTE — Telephone Encounter (Signed)
Noted  

## 2022-02-01 ENCOUNTER — Telehealth: Payer: Self-pay | Admitting: Family Medicine

## 2022-02-01 DIAGNOSIS — R3989 Other symptoms and signs involving the genitourinary system: Secondary | ICD-10-CM

## 2022-02-01 MED ORDER — CEPHALEXIN 500 MG PO CAPS: 500.0000 mg | ORAL_CAPSULE | Freq: Two times a day (BID) | ORAL | 0 refills | Status: AC

## 2022-02-01 NOTE — Progress Notes (Signed)

## 2022-02-17 ENCOUNTER — Ambulatory Visit: Payer: 59 | Admitting: Registered Nurse

## 2022-02-24 ENCOUNTER — Ambulatory Visit: Payer: 59 | Admitting: Physician Assistant

## 2022-03-14 ENCOUNTER — Encounter: Payer: Self-pay | Admitting: Physician Assistant

## 2022-03-14 ENCOUNTER — Ambulatory Visit: Payer: 59 | Admitting: Physician Assistant

## 2022-03-14 DIAGNOSIS — F329 Major depressive disorder, single episode, unspecified: Secondary | ICD-10-CM

## 2022-03-14 DIAGNOSIS — F5105 Insomnia due to other mental disorder: Secondary | ICD-10-CM | POA: Diagnosis not present

## 2022-03-14 DIAGNOSIS — F4310 Post-traumatic stress disorder, unspecified: Secondary | ICD-10-CM | POA: Diagnosis not present

## 2022-03-14 DIAGNOSIS — R69 Illness, unspecified: Secondary | ICD-10-CM | POA: Diagnosis not present

## 2022-03-14 DIAGNOSIS — M329 Systemic lupus erythematosus, unspecified: Secondary | ICD-10-CM

## 2022-03-14 DIAGNOSIS — F411 Generalized anxiety disorder: Secondary | ICD-10-CM | POA: Diagnosis not present

## 2022-03-14 DIAGNOSIS — F99 Mental disorder, not otherwise specified: Secondary | ICD-10-CM | POA: Diagnosis not present

## 2022-03-14 MED ORDER — QUETIAPINE FUMARATE 300 MG PO TABS: 600.0000 mg | ORAL_TABLET | Freq: Every day | ORAL | 1 refills | Status: DC

## 2022-03-14 MED ORDER — GABAPENTIN 600 MG PO TABS
600.0000 mg | ORAL_TABLET | Freq: Three times a day (TID) | ORAL | 3 refills | Status: DC
Start: 2022-03-14 — End: 2022-06-30

## 2022-03-14 MED ORDER — BUPROPION HCL ER (XL) 150 MG PO TB24: 450.0000 mg | ORAL_TABLET | Freq: Every day | ORAL | 1 refills | Status: DC

## 2022-03-14 NOTE — Progress Notes (Signed)
[facility]  Patient ID: Valerie Evans,  MRN: [id number]  PCP: Janeece Agee, NP  Date of Evaluation: 03/14/2022 time spent:30 minutes  Chief Complaint:  Chief Complaint   Anxiety; Depression; Insomnia; Follow-up    HISTORY/CURRENT STATUS: HPI for routine med check.   Wasn't able to get the [employer] changes. Feels indifferent about things. Is trying to push herself to do things outside of work, like going to concerts or something. Is very busy working part time at [employer].   Does not really enjoy a lot of things but in part is due to her physical health.  She has lupus and sometimes has a lot of joint pain.  She has energy to work her part-time job but not much energy for anything else.  Energy and motivation are fair to good depending on the day.  Is sad a lot, no known reason except for dealing with past traumas and current physical health.  Denies tearfulness or feelings of hopelessness.  Sleeps well most of the time. ADLs and personal hygiene are normal.   Denies any changes in concentration, making decisions, or remembering things.  Appetite has not changed.  Weight is stable.  Anxiety is controlled with Klonopin. Still has PA occas, no known trigger. Denies suicidal or homicidal thoughts.  Patient denies increased energy with decreased need for sleep, increased talkativeness, racing thoughts, impulsivity or risky behaviors, increased spending, increased libido, grandiosity, increased irritability or anger, paranoia, or hallucinations.  Denies dizziness, syncope, seizures, numbness, tingling, tremor, tics, unsteady gait, slurred speech, confusion.  Denies stiffness or dystonia. Denies unexplained weight loss, frequent infections, or sores that heal slowly.  No polyphagia, polydipsia, or polyuria. Denies visual changes or paresthesias.   Individual Medical History/ Review of Systems: Changes? :No      Past medications for mental health diagnoses  include: Cymbalta, Zoloft, Wellbutrin, Xanax, trazodone, Lexapro, Buspar, Elavil, Sonata Klonopin, Gabapentin, Prozac, Rexulti, Abilify, Modafanil, Seroquel   Allergies: Patient has no known allergies.  Current Medications:  Current Outpatient Medications:    albuterol (VENTOLIN HFA) 108 (90 Base) MCG/ACT inhaler, Inhale 1-2 puffs into the lungs every 6 (six) hours as needed for wheezing or shortness of breath., Disp: 8 g, Rfl: 11   clonazePAM (KLONOPIN) 1 MG tablet, Take 1 tablet (1 mg total) by mouth in the morning, at noon, and at bedtime. And occas extra 0.5 pill qd., Disp: 105 tablet, Rfl: 5   Dextromethorphan HBr 15 MG CAPS, Take 3 capsules (45 mg total) by mouth 2 (two) times daily., Disp: 30 capsule, Rfl: 0   fluticasone-salmeterol (ADVAIR DISKUS) 250-50 MCG/ACT AEPB, Inhale 1 puff into the lungs in the morning and at bedtime., Disp: 60 each, Rfl: 11   hydroxychloroquine (PLAQUENIL) 200 MG tablet, Take 100 mg by mouth daily., Disp: , Rfl:    levothyroxine (SYNTHROID) 25 MCG tablet, TAKE 1 TABLET BY MOUTH EVERY DAY BEFORE BREAKFAST, Disp: 90 tablet, Rfl: 1   magic mouthwash (lidocaine, diphenhydrAMINE, alum & mag hydroxide) suspension, Swish and spit 5 mLs 3 (three) times daily as needed for mouth pain., Disp: 360 mL, Rfl: 2   Multiple Vitamin (MULTIVITAMIN WITH MINERALS) TABS tablet, Take 1 tablet by mouth daily., Disp: , Rfl:    mupirocin ointment (BACTROBAN) 2 %, Apply 1 Application topically 2 (two) times daily., Disp: 30 g, Rfl: 11   ondansetron (ZOFRAN-ODT) 8 MG disintegrating tablet, Take 1 tablet (8 mg total) by mouth 2 (two) times daily as needed for nausea or vomiting., Disp:  60 tablet, Rfl: 11   PREVIDENT 5000 ENAMEL PROTECT 1.1-5 % GEL, Place 1 application onto teeth 2 (two) times daily., Disp: , Rfl:    QUEtiapine (SEROQUEL) 300 MG tablet, Take 2 tablets (600 mg total) by mouth at bedtime., Disp: 180 tablet, Rfl: 1   traZODone (DESYREL) 100 MG tablet, Take 1-2 tablets (100-200  mg total) by mouth at bedtime as needed for sleep., Disp: 180 tablet, Rfl: 1   buPROPion (WELLBUTRIN XL) 150 MG 24 hr tablet, Take 3 tablets (450 mg total) by mouth daily., Disp: 270 tablet, Rfl: 1   gabapentin (NEURONTIN) 600 MG tablet, Take 1 tablet (600 mg total) by mouth 3 (three) times daily., Disp: 270 tablet, Rfl: 3   sulfamethoxazole-trimethoprim (BACTRIM DS) 800-160 MG tablet, Take 1 tablet by mouth 2 (two) times daily. (Patient not taking: Reported on 03/14/2022), Disp: 6 tablet, Rfl: 1 Medication Side Effects: none  Family Medical/ Social History: Changes? No  MENTAL HEALTH EXAM:  There were no vitals taken for this visit.There is no height or weight on file to calculate BMI.   General Appearance: Casual and Well Groomed   Eye Contact:  Good  Speech:  Clear and Coherent and Normal Rate  Volume:  Normal  Mood:  Depressed  Affect:  Depressed  Thought Process:  Goal Directed and Descriptions of Associations: Circumstantial  Orientation:  Full (Time, Place, and Person)  Thought Content: Logical   Suicidal Thoughts:  No  Homicidal Thoughts:  No  Memory:  WNL  Judgement:  Good  Insight:  Good  Psychomotor Activity:  Normal  Concentration:  Concentration: Good and Attention Span: Good  Recall:  Good  Fund of Knowledge: Good  Language: Good  Assets:  Desire for Improvement  ADL's:  Intact  Cognition: WNL  Prognosis:  Good   DIAGNOSES:    ICD-10-CM   1. Treatment-resistant depression  F32.9     2. PTSD (post-traumatic stress disorder)  F43.10     3. Generalized anxiety disorder  F41.1     4. Insomnia due to other mental disorder  F51.05    F99     5. Systemic lupus erythematosus, unspecified SLE type, unspecified organ involvement status ([place])  M32.9       Receiving Psychotherapy: No   RECOMMENDATIONS:  PDMP reviewed.  Klonopin filled 02/20/2022. I provided 30 minutes of face to face time during this encounter, including time spent before and after the visit in  records review, medical decision making, counseling pertinent to today's visit, and charting.   Unfortunately she was not able to get the Auvelity.  She is on the maximum dose of Wellbutrin already.  We discussed adding dextromethorphan which is the other component and Auvelity.  She would like to try it.  She will get it over the counter.  Otherwise no change in treatment.  Continue Wellbutrin XL 150 mg, 3 po qd. Continue Klonopin 1 mg, 1 p.o. every morning, 1 at noon, 1 at bedtime, with an occasional 0.5 pills daily, all as needed. Dextromethorphan 15 mg, 3 p.o. twice daily. Continue gabapentin 600 mg, 1 p.o. 3 times daily. Continue Seroquel 300 mg, 2 p.o. nightly. Continue trazodone 100 mg, 1-2 nightly as needed sleep. Return in 6 weeks.  Donnal Moat, PA-C

## 2022-03-17 MED ORDER — DEXTROMETHORPHAN HBR 15 MG PO CAPS: 45.0000 mg | ORAL_CAPSULE | Freq: Two times a day (BID) | ORAL | 0 refills | Status: DC

## 2022-04-25 ENCOUNTER — Encounter: Payer: Self-pay | Admitting: Physician Assistant

## 2022-04-25 ENCOUNTER — Ambulatory Visit: Payer: 59 | Admitting: Physician Assistant

## 2022-04-25 DIAGNOSIS — F5105 Insomnia due to other mental disorder: Secondary | ICD-10-CM

## 2022-04-25 DIAGNOSIS — F4310 Post-traumatic stress disorder, unspecified: Secondary | ICD-10-CM | POA: Diagnosis not present

## 2022-04-25 DIAGNOSIS — F99 Mental disorder, not otherwise specified: Secondary | ICD-10-CM | POA: Diagnosis not present

## 2022-04-25 DIAGNOSIS — Z87440 Personal history of urinary (tract) infections: Secondary | ICD-10-CM | POA: Diagnosis not present

## 2022-04-25 DIAGNOSIS — F900 Attention-deficit hyperactivity disorder, predominantly inattentive type: Secondary | ICD-10-CM

## 2022-04-25 DIAGNOSIS — R69 Illness, unspecified: Secondary | ICD-10-CM | POA: Diagnosis not present

## 2022-04-25 DIAGNOSIS — M329 Systemic lupus erythematosus, unspecified: Secondary | ICD-10-CM | POA: Diagnosis not present

## 2022-04-25 DIAGNOSIS — F411 Generalized anxiety disorder: Secondary | ICD-10-CM

## 2022-04-25 DIAGNOSIS — F329 Major depressive disorder, single episode, unspecified: Secondary | ICD-10-CM

## 2022-04-25 DIAGNOSIS — N9412 Deep dyspareunia: Secondary | ICD-10-CM | POA: Diagnosis not present

## 2022-04-25 DIAGNOSIS — R102 Pelvic and perineal pain: Secondary | ICD-10-CM | POA: Diagnosis not present
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
## 2022-04-28 DIAGNOSIS — R102 Pelvic and perineal pain: Secondary | ICD-10-CM | POA: Diagnosis not present

## 2022-05-12 ENCOUNTER — Other Ambulatory Visit: Payer: Self-pay | Admitting: Nurse Practitioner

## 2022-05-12 DIAGNOSIS — R9389 Abnormal findings on diagnostic imaging of other specified body structures: Secondary | ICD-10-CM

## 2022-06-02 ENCOUNTER — Other Ambulatory Visit: Payer: Self-pay

## 2022-06-06 ENCOUNTER — Telehealth: Payer: Self-pay | Admitting: Nurse Practitioner

## 2022-06-06 ENCOUNTER — Ambulatory Visit: Payer: 59 | Admitting: Physician Assistant

## 2022-06-06 DIAGNOSIS — J4521 Mild intermittent asthma with (acute) exacerbation: Secondary | ICD-10-CM

## 2022-06-06 MED ORDER — ALBUTEROL SULFATE HFA 108 (90 BASE) MCG/ACT IN AERS: 2.0000 | INHALATION_SPRAY | Freq: Four times a day (QID) | RESPIRATORY_TRACT | 0 refills | Status: DC | PRN

## 2022-06-06 NOTE — Progress Notes (Signed)
Visit for Asthma  Based on what you have shared with me, it looks like you may have a flare up of your asthma.  Asthma is a chronic (ongoing) lung disease which results in airway obstruction, inflammation and hyper-responsiveness.   Asthma symptoms vary from person to person, with common symptoms including nighttime awakening and decreased ability to participate in normal activities as a result of shortness of breath. It is often triggered by changes in weather, changes in the season, changes in air temperature, or inside (home, school, daycare or work) allergens such as animal dander, mold, mildew, woodstoves or cockroaches.   It can also be triggered by hormonal changes, extreme emotion, physical exertion or an upper respiratory tract illness.     It is important to identify the trigger, and then eliminate or avoid the trigger if possible.   If you have been prescribed medications to be taken on a regular basis, it is important to follow the asthma action plan and to follow guidelines to adjust medication in response to increasing symptoms of decreased peak expiratory flow rate  Treatment: I have prescribed: Albuterol (Proventil HFA; Ventolin HFA) 108 (90 Base) MCG/ACT Inhaler 2 puffs into the lungs every six hours as needed for wheezing or shortness of breath  HOME CARE Only take medications as instructed by your medical team. Consider wearing a mask or scarf to improve breathing air temperature have been shown to decrease irritation and decrease exacerbations Get rest. Taking a steamy shower or using a humidifier may help nasal congestion sand ease sore throat pain. You can place a towel over your head and breathe in the steam from hot water coming from a faucet. Using a saline nasal spray works much the same way.  Cough drops, hare candies and sore throat lozenges may ease your cough.   Avoid close contacts especially the very you and the elderly Cover your mouth if you cough or sneeze Always remember to wash your hands.    GET HELP RIGHT AWAY IF: You develop worsening symptoms; breathlessness at rest, drowsy, confused or agitated, unable to speak in full sentences You have coughing fits You develop a severe headache or visual changes You develop shortness of breath, difficulty breathing or start having chest pain Your symptoms persist after you have completed your treatment plan If your symptoms do not improve within 10 days  MAKE SURE YOU Understand these instructions. Will watch your condition. Will get help right away if you are not doing well or get worse.   Your e-visit answers were reviewed by a board certified advanced clinical practitioner to complete your personal care plan, Depending upon the condition, your plan could have included both over the counter or prescription medications.   Please review your pharmacy choice. Your safety is important to [place]. If you have drug allergies check your prescription carefully.  You can use MyChart to ask questions about today's visit, request a non-urgent  call back, or ask for a work or school excuse for 24 hours related to this e-Visit. If it has been greater than 24 hours you will need to follow up with your provider, or enter a new e-Visit to address those concerns.   You will get an e-mail in the next two days asking about your experience. I hope that your e-visit has been valuable and will speed your recovery. Thank you for using e-visits.   Meds ordered this encounter  Medications   albuterol (VENTOLIN HFA) 108 (90 Base) MCG/ACT inhaler  Sig: Inhale 2 puffs into the lungs every 6 (six) hours as needed for wheezing or shortness of breath.    Dispense:  8 g    Refill:  0    I spent approximately 5 minutes reviewing the patient's history, current symptoms and coordinating their care today.

## 2022-06-10 ENCOUNTER — Other Ambulatory Visit: Payer: Self-pay | Admitting: Physician Assistant

## 2022-06-10 NOTE — Telephone Encounter (Signed)
Filled 12/17

## 2022-06-19 DIAGNOSIS — R5383 Other fatigue: Secondary | ICD-10-CM | POA: Diagnosis not present

## 2022-06-19 DIAGNOSIS — J029 Acute pharyngitis, unspecified: Secondary | ICD-10-CM | POA: Diagnosis not present

## 2022-06-19 DIAGNOSIS — M791 Myalgia, unspecified site: Secondary | ICD-10-CM | POA: Diagnosis not present

## 2022-06-19 DIAGNOSIS — Z6821 Body mass index (BMI) 21.0-21.9, adult: Secondary | ICD-10-CM | POA: Diagnosis not present

## 2022-06-19 DIAGNOSIS — J189 Pneumonia, unspecified organism: Secondary | ICD-10-CM | POA: Diagnosis not present

## 2022-06-25 DIAGNOSIS — N9489 Other specified conditions associated with female genital organs and menstrual cycle: Secondary | ICD-10-CM | POA: Diagnosis not present

## 2022-06-25 DIAGNOSIS — R9389 Abnormal findings on diagnostic imaging of other specified body structures: Secondary | ICD-10-CM | POA: Diagnosis not present

## 2022-06-27 DIAGNOSIS — R051 Acute cough: Secondary | ICD-10-CM | POA: Diagnosis not present

## 2022-06-27 DIAGNOSIS — Z6821 Body mass index (BMI) 21.0-21.9, adult: Secondary | ICD-10-CM | POA: Diagnosis not present

## 2022-06-27 DIAGNOSIS — E039 Hypothyroidism, unspecified: Secondary | ICD-10-CM | POA: Diagnosis not present

## 2022-06-30 ENCOUNTER — Ambulatory Visit: Payer: 59 | Admitting: Physician Assistant

## 2022-06-30 ENCOUNTER — Encounter: Payer: Self-pay | Admitting: Physician Assistant

## 2022-06-30 DIAGNOSIS — Z6821 Body mass index (BMI) 21.0-21.9, adult: Secondary | ICD-10-CM | POA: Diagnosis not present

## 2022-06-30 DIAGNOSIS — M25561 Pain in right knee: Secondary | ICD-10-CM | POA: Diagnosis not present

## 2022-06-30 DIAGNOSIS — F5105 Insomnia due to other mental disorder: Secondary | ICD-10-CM | POA: Diagnosis not present

## 2022-06-30 DIAGNOSIS — M797 Fibromyalgia: Secondary | ICD-10-CM | POA: Diagnosis not present

## 2022-06-30 DIAGNOSIS — F900 Attention-deficit hyperactivity disorder, predominantly inattentive type: Secondary | ICD-10-CM | POA: Diagnosis not present

## 2022-06-30 DIAGNOSIS — M329 Systemic lupus erythematosus, unspecified: Secondary | ICD-10-CM | POA: Diagnosis not present

## 2022-06-30 DIAGNOSIS — F99 Mental disorder, not otherwise specified: Secondary | ICD-10-CM | POA: Diagnosis not present

## 2022-06-30 DIAGNOSIS — R1012 Left upper quadrant pain: Secondary | ICD-10-CM | POA: Diagnosis not present

## 2022-06-30 DIAGNOSIS — R110 Nausea: Secondary | ICD-10-CM | POA: Diagnosis not present

## 2022-06-30 DIAGNOSIS — F4310 Post-traumatic stress disorder, unspecified: Secondary | ICD-10-CM | POA: Diagnosis not present

## 2022-06-30 DIAGNOSIS — F411 Generalized anxiety disorder: Secondary | ICD-10-CM

## 2022-06-30 DIAGNOSIS — M25552 Pain in left hip: Secondary | ICD-10-CM | POA: Diagnosis not present

## 2022-06-30 DIAGNOSIS — R69 Illness, unspecified: Secondary | ICD-10-CM | POA: Diagnosis not present

## 2022-06-30 DIAGNOSIS — F329 Major depressive disorder, single episode, unspecified: Secondary | ICD-10-CM

## 2022-06-30 DIAGNOSIS — R079 Chest pain, unspecified: Secondary | ICD-10-CM | POA: Diagnosis not present

## 2022-06-30 DIAGNOSIS — M545 Low back pain, unspecified: Secondary | ICD-10-CM | POA: Diagnosis not present

## 2022-06-30 DIAGNOSIS — M25562 Pain in left knee: Secondary | ICD-10-CM | POA: Diagnosis not present

## 2022-06-30 MED ORDER — GABAPENTIN 800 MG PO TABS: 800.0000 mg | ORAL_TABLET | Freq: Three times a day (TID) | ORAL | 1 refills | Status: DC

## 2022-06-30 NOTE — Progress Notes (Signed)
[facility] Check  Patient ID: Valerie Evans,  MRN: [id number]  PCP: Maximiano Coss, NP  Date of Evaluation: 06/30/2022 time spent:20 minutes  Chief Complaint:  Chief Complaint   Depression; Anxiety; Insomnia; Follow-up    HISTORY/CURRENT STATUS: HPI for routine med check.   We added that dextromethorphan about 6 weeks ago, she only took it for a few days because she was diagnosed with pneumonia.  She realized it was a cough suppressant and she should not take that with her other rescue medications for the pneumonia.  She is doing much better now although not 100%.  Still needing her inhalers.    Asks about whether it would be smart to change Klonopin back to Xanax.  There are approximately 4 days a week where she feels panicky.  The Klonopin does help prevent panic attacks but she would like to have something as a rescue medication.  Still has a hard time enjoying things.  Energy and motivation are fair right now, and she is recovering from pneumonia.  Work is going well.  Still works 3 days a week.  Not crying easily.  Sleeps well most of the time.  Not having nightmares since being on Seroquel.  ADLs and personal hygiene are normal.   Denies any changes in concentration, making decisions, or remembering things.  Appetite has not changed.  Weight is stable.   Denies suicidal or homicidal thoughts.  Patient denies increased energy with decreased need for sleep, increased talkativeness, racing thoughts, impulsivity or risky behaviors, increased spending, increased libido, grandiosity, increased irritability or anger, paranoia, or hallucinations.  Denies dizziness, syncope, seizures, numbness, tingling, tremor, tics, unsteady gait, slurred speech, confusion. Denies muscle or joint pain, stiffness, or dystonia. Denies unexplained weight loss, frequent infections, or sores that heal slowly.  No polyphagia, polydipsia, or polyuria. Denies visual changes or paresthesias.   Individual  Medical History/ Review of Systems: Changes? :No      Past medications for mental health diagnoses include: Cymbalta, Zoloft, Wellbutrin, Xanax, trazodone, Lexapro, Buspar, Elavil, Sonata Klonopin, Gabapentin, Prozac, Rexulti, Abilify, Modafanil, Seroquel  Allergies: Patient has no known allergies.  Current Medications:  Current Outpatient Medications:    albuterol (VENTOLIN HFA) 108 (90 Base) MCG/ACT inhaler, Inhale 1-2 puffs into the lungs every 6 (six) hours as needed for wheezing or shortness of breath., Disp: 8 g, Rfl: 11   albuterol (VENTOLIN HFA) 108 (90 Base) MCG/ACT inhaler, Inhale 2 puffs into the lungs every 6 (six) hours as needed for wheezing or shortness of breath., Disp: 8 g, Rfl: 0   buPROPion (WELLBUTRIN XL) 150 MG 24 hr tablet, Take 3 tablets (450 mg total) by mouth daily., Disp: 270 tablet, Rfl: 1   clonazePAM (KLONOPIN) 1 MG tablet, TAKE 1 TABLET IN THE MORNING, AT NOON, AND AT BEDTIME AND OCCASIONALLY EXTRA 1/2 TABLET DAILY, Disp: 105 tablet, Rfl: 5   fluticasone-salmeterol (ADVAIR DISKUS) 250-50 MCG/ACT AEPB, Inhale 1 puff into the lungs in the morning and at bedtime., Disp: 60 each, Rfl: 11   gabapentin (NEURONTIN) 800 MG tablet, Take 1 tablet (800 mg total) by mouth 3 (three) times daily., Disp: 90 tablet, Rfl: 1   hydroxychloroquine (PLAQUENIL) 200 MG tablet, Take 100 mg by mouth daily., Disp: , Rfl:    levothyroxine (SYNTHROID) 25 MCG tablet, TAKE 1 TABLET BY MOUTH EVERY DAY BEFORE BREAKFAST, Disp: 90 tablet, Rfl: 1   Multiple Vitamin (MULTIVITAMIN WITH MINERALS) TABS tablet, Take 1 tablet by mouth daily., Disp: , Rfl:    mupirocin ointment (  BACTROBAN) 2 %, Apply 1 Application topically 2 (two) times daily., Disp: 30 g, Rfl: 11   ondansetron (ZOFRAN-ODT) 8 MG disintegrating tablet, Take 1 tablet (8 mg total) by mouth 2 (two) times daily as needed for nausea or vomiting., Disp: 60 tablet, Rfl: 11   PREVIDENT 5000 ENAMEL PROTECT 1.1-5 % GEL, Place 1 application onto teeth  2 (two) times daily., Disp: , Rfl:    QUEtiapine (SEROQUEL) 300 MG tablet, Take 2 tablets (600 mg total) by mouth at bedtime., Disp: 180 tablet, Rfl: 1   traZODone (DESYREL) 100 MG tablet, Take 1-2 tablets (100-200 mg total) by mouth at bedtime as needed for sleep., Disp: 180 tablet, Rfl: 1   Dextromethorphan HBr 15 MG CAPS, Take 3 capsules (45 mg total) by mouth 2 (two) times daily. (Patient not taking: Reported on 06/30/2022), Disp: 30 capsule, Rfl: 0   magic mouthwash (lidocaine, diphenhydrAMINE, alum & mag hydroxide) suspension, Swish and spit 5 mLs 3 (three) times daily as needed for mouth pain. (Patient not taking: Reported on 04/25/2022), Disp: 360 mL, Rfl: 2 Medication Side Effects: none  Family Medical/ Social History: Changes? No  MENTAL HEALTH EXAM:  There were no vitals taken for this visit.There is no height or weight on file to calculate BMI.   General Appearance: Casual and Well Groomed   Eye Contact:  Good  Speech:  Clear and Coherent and Normal Rate  Volume:  Normal  Mood:  Euthymic  Affect:  Congruent  Thought Process:  Goal Directed and Descriptions of Associations: Circumstantial  Orientation:  Full (Time, Place, and Person)  Thought Content: Logical   Suicidal Thoughts:  No  Homicidal Thoughts:  No  Memory:  WNL  Judgement:  Good  Insight:  Good  Psychomotor Activity:  Normal  Concentration:  Concentration: Good and Attention Span: Good  Recall:  Good  Fund of Knowledge: Good  Language: Good  Assets:  Desire for Improvement Financial Resources/Insurance Housing Transportation Vocational/Educational  ADL's:  Intact  Cognition: WNL  Prognosis:  Good   DIAGNOSES:    ICD-10-CM   1. Treatment-resistant depression  F32.9     2. Generalized anxiety disorder  F41.1     3. PTSD (post-traumatic stress disorder)  F43.10     4. Insomnia due to other mental disorder  F51.05    F99     5. Attention deficit hyperactivity disorder (ADHD), predominantly inattentive  type  F90.0       Receiving Psychotherapy: No   RECOMMENDATIONS:  PDMP reviewed.  Klonopin filled 06/23/2022.  Gabapentin filled 06/25/2022. I provided 20 minutes of face to face time during this encounter, including time spent before and after the visit in records review, medical decision making, counseling pertinent to today's visit, and charting.   We discussed the anxiety.  I recommend increasing the gabapentin rather than changing Klonopin to Xanax.  My reasoning is the Xanax usually a shorter acting than Klonopin.  That is not good for her.  We may decide to change at some point in the future but I would like to try this first.  She understands.  Continue Wellbutrin XL 150 mg, 3 po qd. Continue Klonopin 1 mg, 1 p.o. every morning, 1 at noon, 1 at bedtime, with an occasional 0.5 pills daily, all as needed. Restart dextromethorphan 45 mg twice daily.  She will start this in 2 weeks as long as her respiratory symptoms have completely resolved. Increase gabapentin to 800 mg, 1 p.o. 3 times daily. Continue Seroquel 300 mg,  2 p.o. nightly. Continue trazodone 100 mg, 1-2 nightly as needed sleep. Strongly recommend therapy for PTSD.  Return in 6 weeks.  Donnal Moat, PA-C

## 2022-07-17 ENCOUNTER — Other Ambulatory Visit: Payer: Self-pay | Admitting: Physician Assistant

## 2022-07-21 ENCOUNTER — Ambulatory Visit: Payer: 59 | Admitting: Family Medicine

## 2022-07-21 ENCOUNTER — Encounter: Payer: Self-pay | Admitting: Family Medicine

## 2022-07-21 VITALS — BP 98/68 | HR 85 | Temp 98.7°F | Ht 59.0 in | Wt 110.0 lb

## 2022-07-21 DIAGNOSIS — R300 Dysuria: Secondary | ICD-10-CM

## 2022-07-21 DIAGNOSIS — N3001 Acute cystitis with hematuria: Secondary | ICD-10-CM | POA: Diagnosis not present

## 2022-07-21 DIAGNOSIS — Z3169 Encounter for other general counseling and advice on procreation: Secondary | ICD-10-CM | POA: Diagnosis not present

## 2022-07-21 DIAGNOSIS — N920 Excessive and frequent menstruation with regular cycle: Secondary | ICD-10-CM | POA: Diagnosis not present

## 2022-07-21 DIAGNOSIS — J4540 Moderate persistent asthma, uncomplicated: Secondary | ICD-10-CM | POA: Diagnosis not present

## 2022-07-21 DIAGNOSIS — E039 Hypothyroidism, unspecified: Secondary | ICD-10-CM

## 2022-07-21 DIAGNOSIS — R9389 Abnormal findings on diagnostic imaging of other specified body structures: Secondary | ICD-10-CM | POA: Diagnosis not present

## 2022-07-21 DIAGNOSIS — N9489 Other specified conditions associated with female genital organs and menstrual cycle: Secondary | ICD-10-CM | POA: Diagnosis not present

## 2022-07-21 LAB — POCT URINALYSIS DIPSTICK
Bilirubin, UA: NEGATIVE — NL
Bilirubin, UA: NEGATIVE
Blood, UA: 10
Blood, UA: 10
Glucose, UA: NEGATIVE — NL
Glucose, UA: NEGATIVE
Ketones, UA: NEGATIVE — NL
Ketones, UA: NEGATIVE
Nitrite, UA: NEGATIVE
Nitrite, UA: NEGATIVE
Protein, UA: NEGATIVE
Protein, UA: NEGATIVE
Spec Grav, UA: >=1.03 — NL (ref 1.010–1.025)
Spec Grav, UA: >=1.03 — AB (ref 1.010–1.025)
Urobilinogen, UA: 0.2 E.U./dL
Urobilinogen, UA: 0.2 E.U./dL
pH, UA: 6 (ref 5.0–8.0)
pH, UA: 6 (ref 5.0–8.0)

## 2022-07-21 LAB — TSH: TSH: 2.72 u[IU]/mL (ref 0.35–5.50)

## 2022-07-21 MED ORDER — FLUTICASONE-SALMETEROL 250-50 MCG/ACT IN AEPB: 1.0000 | INHALATION_SPRAY | Freq: Two times a day (BID) | RESPIRATORY_TRACT | 11 refills | Status: DC

## 2022-07-21 MED ORDER — NITROFURANTOIN MONOHYD MACRO 100 MG PO CAPS: 100.0000 mg | ORAL_CAPSULE | Freq: Two times a day (BID) | ORAL | 0 refills | Status: DC
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
## 2022-07-21 NOTE — Patient Instructions (Signed)
It was a pleasure to meet you today and I look forward to taking care of you! - Urinalysis had some leukocytes and blood present, suggestive of a urinary tract infection. Will send urine for culture. In the meantime, will treat with Macrobid '100mg'$ , 1 tablet twice a day.  -Ordered TSH level for hypothyroidism.  -Prescribed Advair for asthma control with Albuterol inhaler. If you are not able to get the medication, please call back to the office or send me a MyChart message.  -Follow up in 6 months or sooner if symptoms do not improve.

## 2022-07-21 NOTE — Assessment & Plan Note (Signed)
Urinalysis positive for blood, leukocytes, and elevated spec gravity. Sent urine for culture. In the meantime, prescribed Macrobid '100mg'$  BID for 5 days.

## 2022-07-21 NOTE — Assessment & Plan Note (Signed)
Continue with Levothryzoine 35mg daily. Ordered TSH for this visit since she is complaining of being tired to rule out it is not related to thyroid. Will refill Levothyroxine based on lab results.

## 2022-07-21 NOTE — Assessment & Plan Note (Signed)
Patient reports she is using her Albuterol Inhaler at least once a day for the last 6 months. She is using her rescue inhaler too much for asthma. Prescribed Advair inhaler since she has previously been on this medication for asthma. She thinks it controlled her asthma, but unsure if the cost was the reason for not staying on medication. Informed patient if she was unable to obtain Advair due to cost or other reasons, either call back to the office or send a MyChart message. Lungs are clear today. If Advair is not effective, may need to try another inhaler or send to pulmonary.

## 2022-07-22 ENCOUNTER — Other Ambulatory Visit: Payer: Self-pay

## 2022-07-22 ENCOUNTER — Telehealth: Payer: Self-pay

## 2022-07-22 DIAGNOSIS — E039 Hypothyroidism, unspecified: Secondary | ICD-10-CM

## 2022-07-22 MED ORDER — LEVOTHYROXINE SODIUM 25 MCG PO TABS: ORAL_TABLET | ORAL | 1 refills | Status: DC

## 2022-07-22 NOTE — Telephone Encounter (Signed)
Left lab results on pt VM and refilled her Levothyroxine at same dose

## 2022-07-22 NOTE — Telephone Encounter (Signed)
-----   Message from Evangeline Gula, NP sent at 07/21/2022  4:54 PM EST ----- TSH is normal. Her Levothyroxine can be refilled at the same dosage.

## 2022-07-23 LAB — URINE CULTURE
MICRO NUMBER:: 14614212
SPECIMEN QUALITY:: ADEQUATE

## 2022-07-24 ENCOUNTER — Telehealth: Payer: Self-pay

## 2022-07-24 NOTE — Telephone Encounter (Signed)
-----   Message from Evangeline Gula, NP sent at 07/23/2022  3:48 PM EST ----- E. Coli, a bacteria, was found to be the source of your urinary tract infection. Macrobid will cover infection. Follow up if not improved.

## 2022-07-24 NOTE — Telephone Encounter (Signed)
Left lab results on pt VM

## 2022-07-28 ENCOUNTER — Encounter: Payer: Self-pay | Admitting: Family Medicine

## 2022-07-28 DIAGNOSIS — N3001 Acute cystitis with hematuria: Secondary | ICD-10-CM

## 2022-07-29 MED ORDER — NITROFURANTOIN MONOHYD MACRO 100 MG PO CAPS: 100.0000 mg | ORAL_CAPSULE | Freq: Two times a day (BID) | ORAL | 0 refills | Status: DC

## 2022-07-29 NOTE — Telephone Encounter (Signed)
Pt reports losing her abx and asking how to proceed I did send a follow up message to find out how many doses she had taken. What can we do for her?

## 2022-08-08 ENCOUNTER — Other Ambulatory Visit: Payer: Self-pay

## 2022-08-08 DIAGNOSIS — E039 Hypothyroidism, unspecified: Secondary | ICD-10-CM

## 2022-08-08 MED ORDER — LEVOTHYROXINE SODIUM 25 MCG PO TABS: ORAL_TABLET | ORAL | 1 refills | Status: DC

## 2022-08-11 ENCOUNTER — Encounter: Payer: Self-pay | Admitting: Physician Assistant

## 2022-08-11 ENCOUNTER — Telehealth (INDEPENDENT_AMBULATORY_CARE_PROVIDER_SITE_OTHER): Payer: 59 | Admitting: Physician Assistant

## 2022-08-11 DIAGNOSIS — F329 Major depressive disorder, single episode, unspecified: Secondary | ICD-10-CM | POA: Diagnosis not present

## 2022-08-11 DIAGNOSIS — F900 Attention-deficit hyperactivity disorder, predominantly inattentive type: Secondary | ICD-10-CM

## 2022-08-11 DIAGNOSIS — F99 Mental disorder, not otherwise specified: Secondary | ICD-10-CM

## 2022-08-11 DIAGNOSIS — F5105 Insomnia due to other mental disorder: Secondary | ICD-10-CM

## 2022-08-11 DIAGNOSIS — F411 Generalized anxiety disorder: Secondary | ICD-10-CM | POA: Diagnosis not present

## 2022-08-11 DIAGNOSIS — F4310 Post-traumatic stress disorder, unspecified: Secondary | ICD-10-CM

## 2022-08-11 DIAGNOSIS — M329 Systemic lupus erythematosus, unspecified: Secondary | ICD-10-CM

## 2022-08-11 MED ORDER — GABAPENTIN 800 MG PO TABS: 800.0000 mg | ORAL_TABLET | Freq: Three times a day (TID) | ORAL | 1 refills | Status: DC

## 2022-08-11 MED ORDER — TRAZODONE HCL 100 MG PO TABS: 100.0000 mg | ORAL_TABLET | Freq: Every evening | ORAL | 1 refills | Status: DC | PRN

## 2022-08-11 NOTE — Progress Notes (Signed)
[facility]  Patient ID: NYSHA FUSSELL,  MRN: [id number]  PCP: Evangeline Gula, NP  Date of Evaluation: 08/11/2022 time spent:20 minutes  Chief Complaint:  Chief Complaint   Anxiety; Depression; Insomnia; Follow-up   Virtual Visit via Telehealth  I connected with patient by a video enabled telemedicine application with their informed consent, and verified patient privacy and that I am speaking with the correct person using two identifiers.  I am private, in my office and the patient is at home.  I discussed the limitations, risks, security and privacy concerns of performing an evaluation and management service by video and the availability of in person appointments. I also discussed with the patient that there may be a patient responsible charge related to this service. The patient expressed understanding and agreed to proceed.   I discussed the assessment and treatment plan with the patient. The patient was provided an opportunity to ask questions and all were answered. The patient agreed with the plan and demonstrated an understanding of the instructions.   The patient was advised to call back or seek an in-person evaluation if the symptoms worsen or if the condition fails to improve as anticipated.  I provided 20  minutes of non-face-to-face time during this encounter.  HISTORY/CURRENT STATUS: HPI for routine med check.   We increased Gabapentin at LOV, hasn't noticed much difference in anxiety. She looked at the bottle while we were talking and found that the pharmacy had only given her 600 mg pills.  "So basically we have been doing nothing since our last visit."  Not having panic attacks but just feels overwhelmed and on edge a lot, like something bad is going to happen.    She did not add the dextromethorphan because she did not want to make too many changes at once. States she has been crying easily, no known reason.  Energy and motivation are fair to good,  depending on the day.  She is working and not having any problems with her part-time job at [employer]  Appetite is normal and weight is stable.  ADLs and personal hygiene are normal.  Sleeps well most of the time.  She does take the trazodone when needed and it is effective.  Denies suicidal or homicidal thoughts.  Patient denies increased energy with decreased need for sleep, increased talkativeness, racing thoughts, impulsivity or risky behaviors, increased spending, increased libido, grandiosity, increased irritability or anger, paranoia, or hallucinations.  Denies dizziness, syncope, seizures, numbness, tingling, tremor, tics, unsteady gait, slurred speech, confusion. Denies muscle or joint pain, stiffness, or dystonia. Denies unexplained weight loss, frequent infections, or sores that heal slowly.  No polyphagia, polydipsia, or polyuria. Denies visual changes or paresthesias.   Individual Medical History/ Review of Systems: Changes? :No      Past medications for mental health diagnoses include: Cymbalta, Zoloft, Wellbutrin, Xanax, trazodone, Lexapro, Buspar, Elavil, Sonata Klonopin, Gabapentin, Prozac, Rexulti, Abilify, Modafanil, Seroquel  Allergies: Patient has no known allergies.  Current Medications:  Current Outpatient Medications:    albuterol (VENTOLIN HFA) 108 (90 Base) MCG/ACT inhaler, Inhale 2 puffs into the lungs every 6 (six) hours as needed for wheezing or shortness of breath., Disp: 8 g, Rfl: 0   buPROPion (WELLBUTRIN XL) 150 MG 24 hr tablet, Take 3 tablets (450 mg total) by mouth daily., Disp: 270 tablet, Rfl: 1   clonazePAM (KLONOPIN) 1 MG tablet, TAKE 1 TABLET IN THE MORNING, AT NOON, AND AT BEDTIME AND OCCASIONALLY EXTRA 1/2 TABLET DAILY, Disp:  105 tablet, Rfl: 5   fluticasone-salmeterol (ADVAIR DISKUS) 250-50 MCG/ACT AEPB, Inhale 1 puff into the lungs in the morning and at bedtime., Disp: 60 each, Rfl: 11   hydroxychloroquine (PLAQUENIL) 200 MG tablet, Take 100 mg by mouth  daily., Disp: , Rfl:    levothyroxine (SYNTHROID) 25 MCG tablet, TAKE 1 TABLET BY MOUTH EVERY DAY BEFORE BREAKFAST, Disp: 90 tablet, Rfl: 1   magic mouthwash (lidocaine, diphenhydrAMINE, alum & mag hydroxide) suspension, Swish and spit 5 mLs 3 (three) times daily as needed for mouth pain., Disp: 360 mL, Rfl: 2   Multiple Vitamin (MULTIVITAMIN WITH MINERALS) TABS tablet, Take 1 tablet by mouth daily., Disp: , Rfl:    mupirocin ointment (BACTROBAN) 2 %, Apply 1 Application topically 2 (two) times daily., Disp: 30 g, Rfl: 11   ondansetron (ZOFRAN-ODT) 8 MG disintegrating tablet, Take 1 tablet (8 mg total) by mouth 2 (two) times daily as needed for nausea or vomiting., Disp: 60 tablet, Rfl: 11   PREVIDENT 5000 ENAMEL PROTECT 1.1-5 % GEL, Place 1 application onto teeth 2 (two) times daily., Disp: , Rfl:    QUEtiapine (SEROQUEL) 300 MG tablet, Take 2 tablets (600 mg total) by mouth at bedtime., Disp: 180 tablet, Rfl: 1   gabapentin (NEURONTIN) 800 MG tablet, Take 1 tablet (800 mg total) by mouth 3 (three) times daily., Disp: 90 tablet, Rfl: 1   traZODone (DESYREL) 100 MG tablet, Take 1-2 tablets (100-200 mg total) by mouth at bedtime as needed for sleep., Disp: 180 tablet, Rfl: 1 Medication Side Effects: none  Family Medical/ Social History: Changes? No  MENTAL HEALTH EXAM:  There were no vitals taken for this visit.There is no height or weight on file to calculate BMI.   General Appearance: Casual and Well Groomed   Eye Contact:  Good  Speech:  Clear and Coherent and Normal Rate  Volume:  Normal  Mood:  Euthymic  Affect:  Congruent  Thought Process:  Goal Directed and Descriptions of Associations: Circumstantial  Orientation:  Full (Time, Place, and Person)  Thought Content: Logical   Suicidal Thoughts:  No  Homicidal Thoughts:  No  Memory:  WNL  Judgement:  Good  Insight:  Good  Psychomotor Activity:  Normal  Concentration:  Concentration: Good and Attention Span: Good  Recall:  Good   Fund of Knowledge: Good  Language: Good  Assets:  Communication Skills Desire for Improvement Financial Resources/Insurance Housing Transportation Vocational/Educational  ADL's:  Intact  Cognition: WNL  Prognosis:  Good   DIAGNOSES:    ICD-10-CM   1. Treatment-resistant depression  F32.9     2. Generalized anxiety disorder  F41.1     3. PTSD (post-traumatic stress disorder)  F43.10     4. Insomnia due to other mental disorder  F51.05    F99     5. Attention deficit hyperactivity disorder (ADHD), predominantly inattentive type  F90.0     6. Systemic lupus erythematosus, unspecified SLE type, unspecified organ involvement status ([place])  M32.9       Receiving Psychotherapy: No   RECOMMENDATIONS:  PDMP reviewed.  Klonopin filled 07/23/2022.  Gabapentin filled 07/28/2022. I provided 20 minutes of non-face-to-face time during this encounter, including time spent before and after the visit in records review, medical decision making, counseling pertinent to today's visit, and charting.   We discussed the gabapentin.  I will send in a new prescription with a note to the pharmacist to check the dose.  I also recommend that she start the dextromethorphan.  I prefer her to try that before we consider her antidepressants treatment failure.  She agrees.  Continue Wellbutrin XL 150 mg, 3 po qd. Continue Klonopin 1 mg, 1 p.o. every morning, 1 at noon, 1 at bedtime, with an occasional 0.5 pills daily, all as needed. Restart dextromethorphan 45 mg twice daily.  She will start this in 2 weeks as long as her respiratory symptoms have completely resolved. Increase gabapentin to 800 mg, 1 p.o. 3 times daily. Continue Seroquel 300 mg, 2 p.o. nightly. Continue trazodone 100 mg, 1-2 nightly as needed sleep. Strongly recommend therapy for PTSD.  Return in 4-6 weeks.  Donnal Moat, PA-C

## 2022-09-12 ENCOUNTER — Encounter: Payer: Self-pay | Admitting: Family Medicine

## 2022-09-12 NOTE — Telephone Encounter (Signed)
Pt requesting treatment, you would like a visit for [place] to test and confirm right?
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
## 2022-09-22 DIAGNOSIS — R102 Pelvic and perineal pain: Secondary | ICD-10-CM | POA: Diagnosis not present

## 2022-09-22 DIAGNOSIS — Z01818 Encounter for other preprocedural examination: Secondary | ICD-10-CM | POA: Diagnosis not present

## 2022-09-22 DIAGNOSIS — N9489 Other specified conditions associated with female genital organs and menstrual cycle: Secondary | ICD-10-CM | POA: Diagnosis not present

## 2022-09-24 ENCOUNTER — Encounter (HOSPITAL_BASED_OUTPATIENT_CLINIC_OR_DEPARTMENT_OTHER): Payer: Self-pay | Admitting: Obstetrics and Gynecology

## 2022-09-24 NOTE — Progress Notes (Signed)
Spoke w/ via phone for pre-op interview---Valerie Evans needs dos----  CBC,UPT and T&S per surgeon             Evans results------ COVID test -----patient states asymptomatic no test needed Arrive at -------1200 NPO after MN NO Solid Food.  Clear liquids from MN until---1100 Med rec completed Medications to take morning of surgery -----Albuterol-bring, Wellbutrin, Klonopin, Gabapentin, Synthroid.  Diabetic medication ----- Patient instructed no nail polish to be worn day of surgery Patient instructed to bring photo id and insurance card day of surgery Patient aware to have Driver (ride ) / caregiver   Husband -Valerie Evans for 24 hours after surgery  Patient Special Instructions ----- Pre-Op special Instructions ----- Patient verbalized understanding of instructions that were given at this phone interview. Patient denies shortness of breath, chest pain, fever, cough at this phone interview.

## 2022-10-01 ENCOUNTER — Other Ambulatory Visit: Payer: Self-pay

## 2022-10-01 ENCOUNTER — Encounter: Payer: Self-pay | Admitting: Family Medicine

## 2022-10-01 DIAGNOSIS — R194 Change in bowel habit: Secondary | ICD-10-CM | POA: Insufficient documentation

## 2022-10-01 DIAGNOSIS — J4521 Mild intermittent asthma with (acute) exacerbation: Secondary | ICD-10-CM

## 2022-10-01 IMAGING — DX DG HIP (WITH OR WITHOUT PELVIS) 2-3V*L*
3 series · 3 of 3 positions shown · non-contrast
Comparison: None Available.

CLINICAL DATA: Chronic left hip pain, low back pain

EXAM:
DG HIP (WITH OR WITHOUT PELVIS) 2-3V LEFT

[pelvis ap]
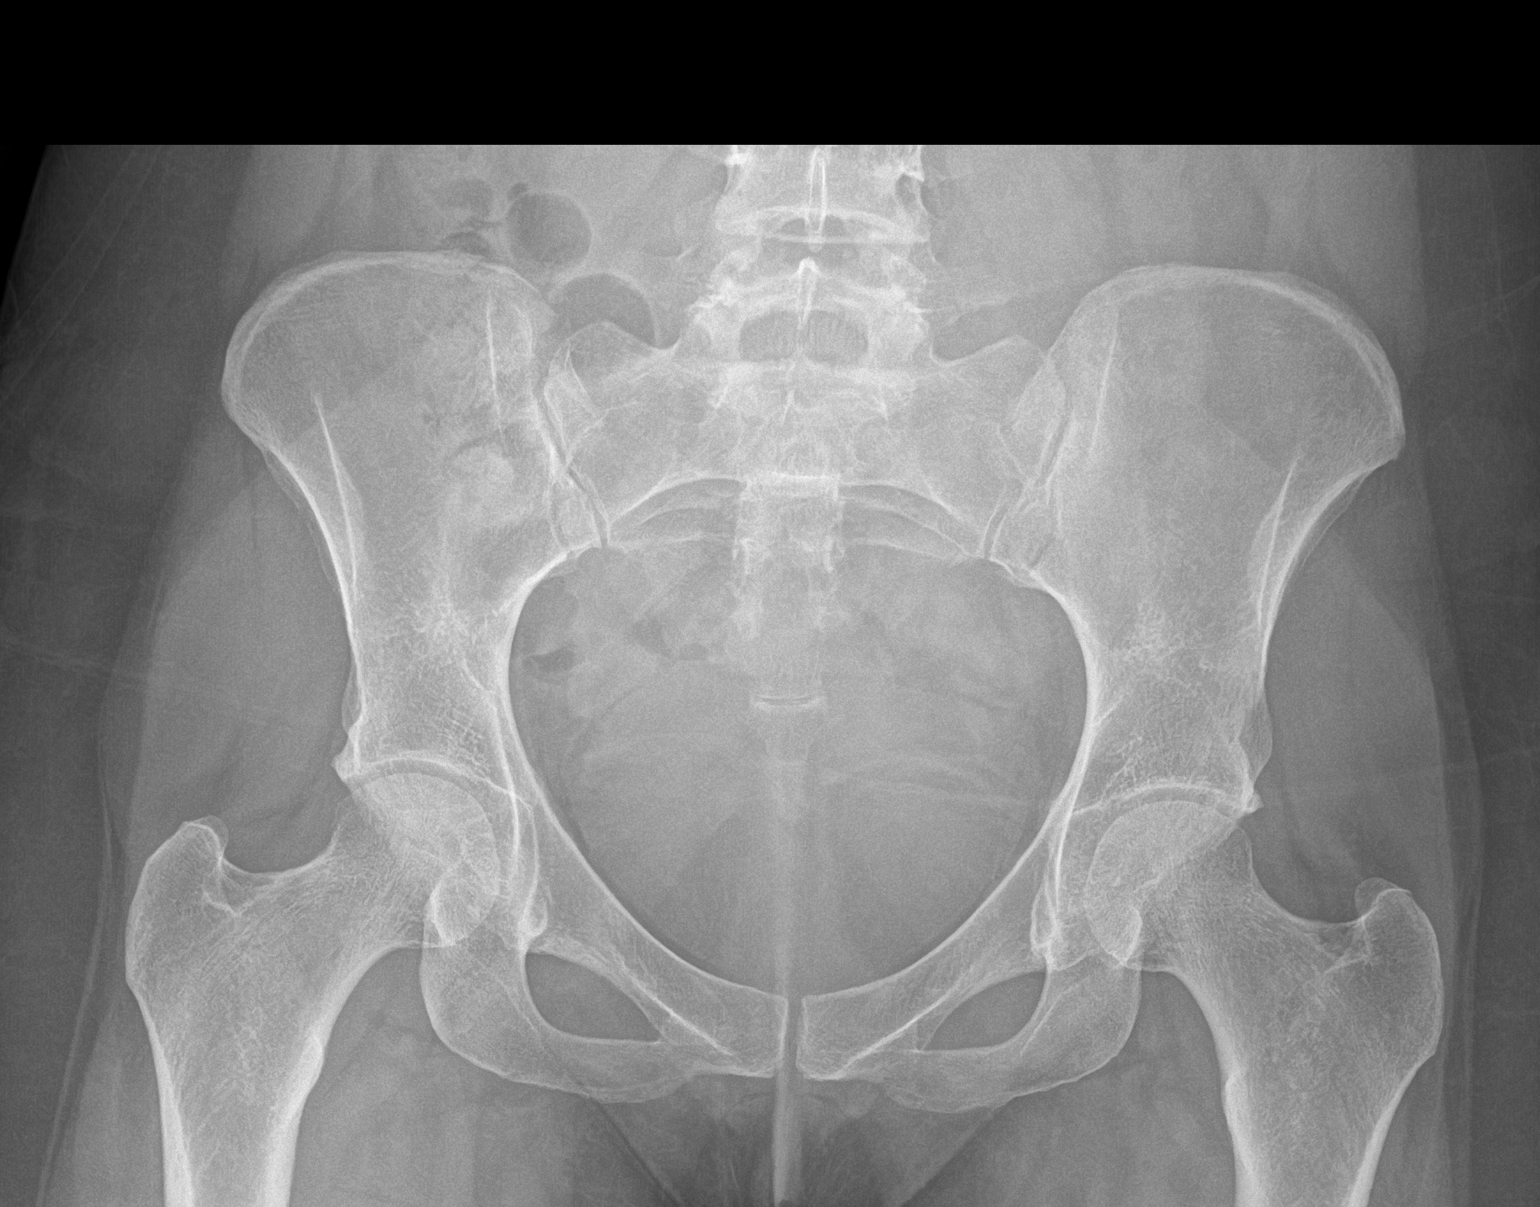

[hip ap]
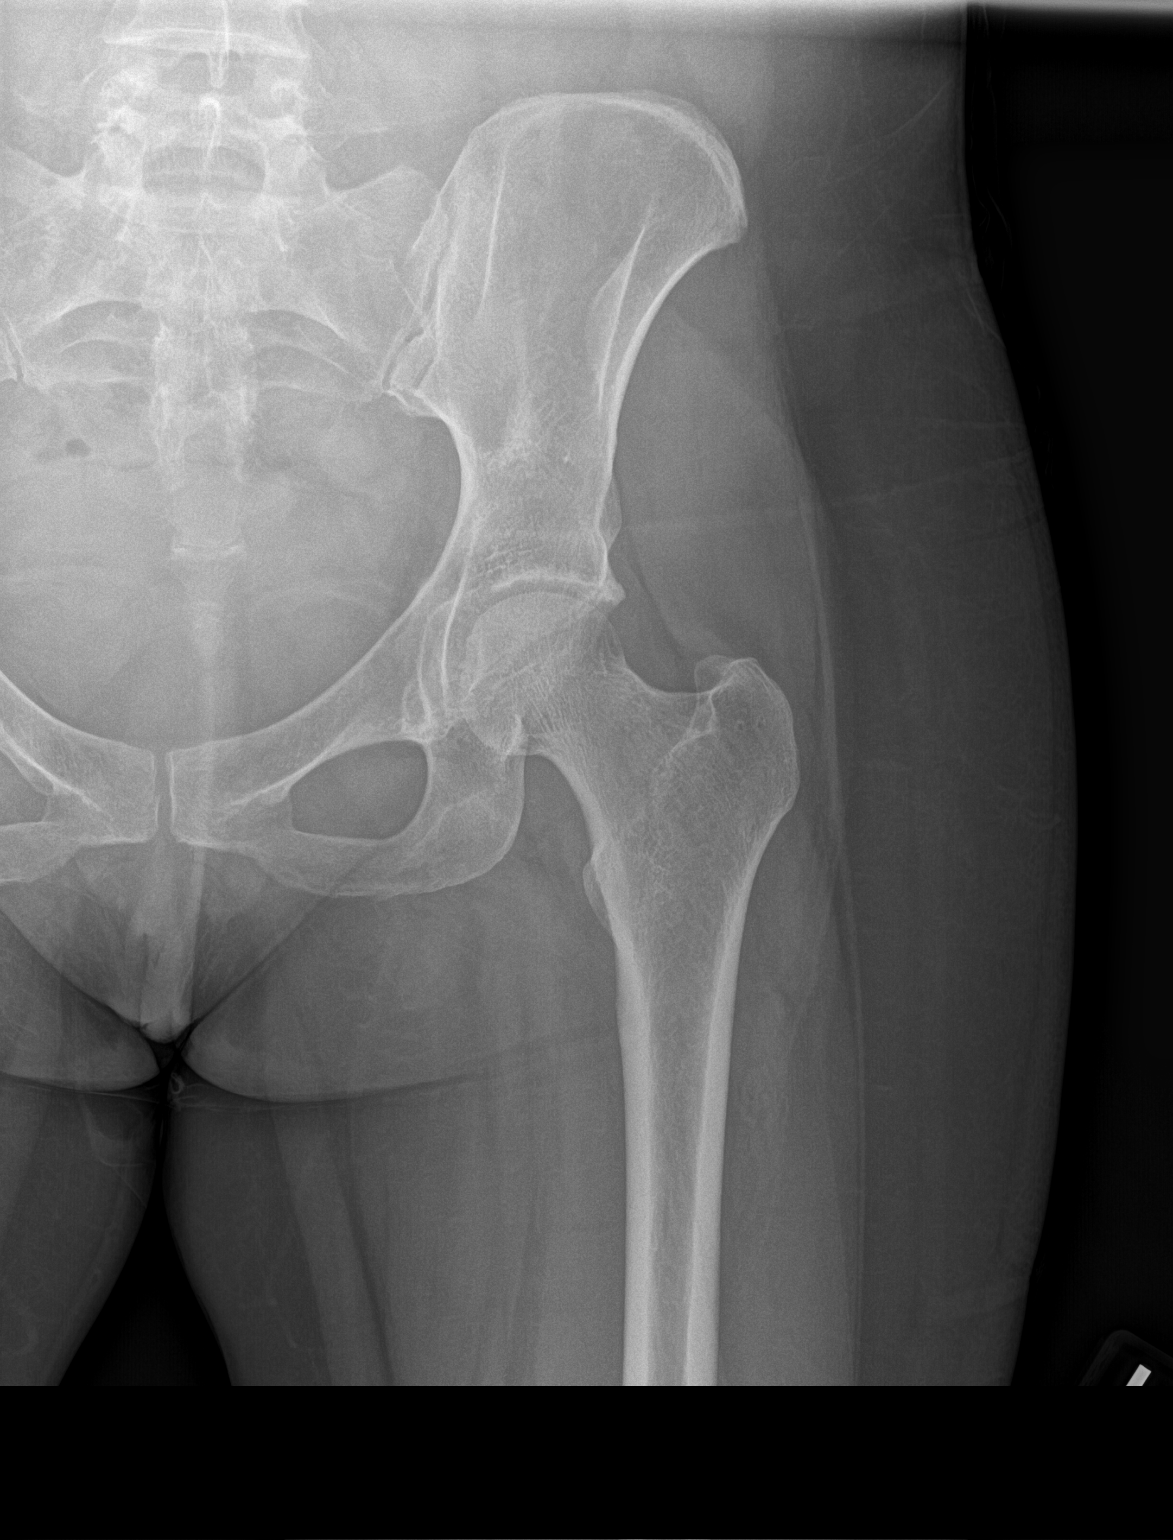

[hip frog leg]
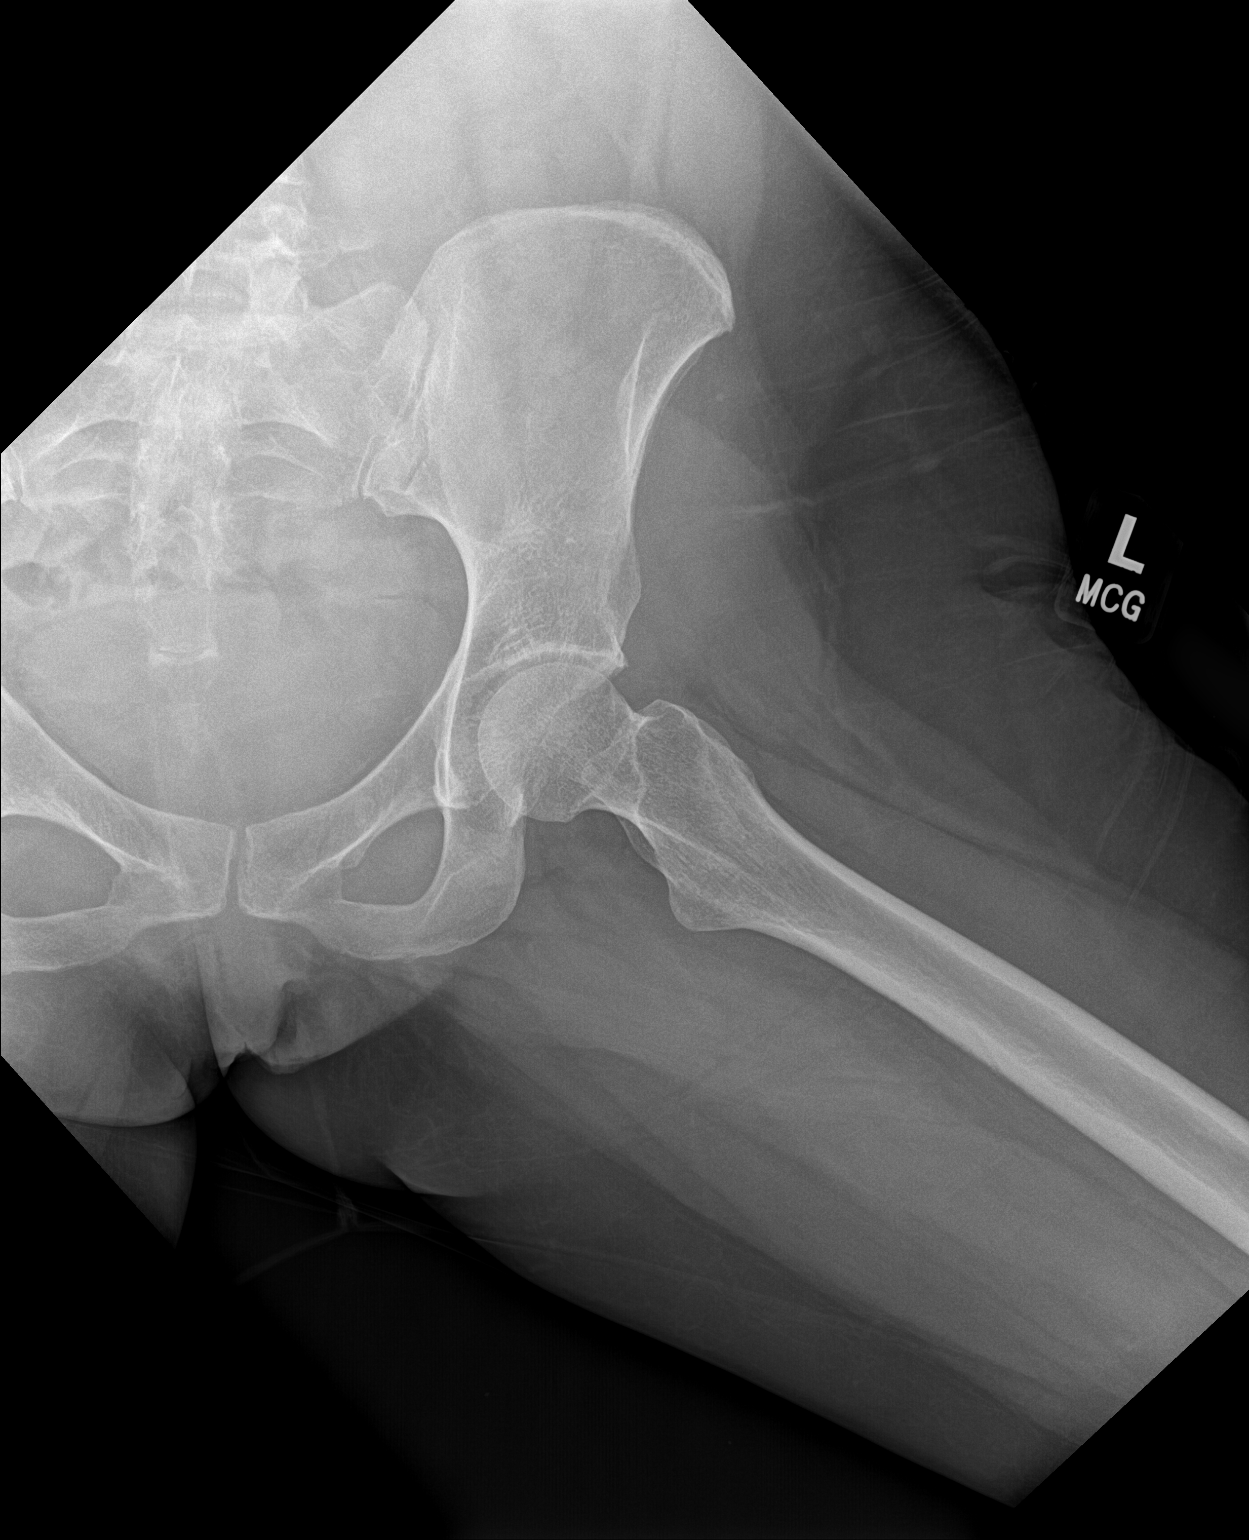

[3 of 3 positions shown; findings below may reference images not displayed]

FINDINGS: Frontal view of the pelvis as well as frontal and frogleg lateral
views of the left hip are obtained. No acute fracture, subluxation,
or dislocation. Joint spaces are well preserved. Sacroiliac joints
are unremarkable.
IMPRESSION: 1. Unremarkable pelvis and left hip.

## 2022-10-01 IMAGING — DX DG LUMBAR SPINE COMPLETE 4+V
5 series · 5 of 5 positions shown · non-contrast
Comparison: 09/19/2020

CLINICAL DATA: Chronic low back pain, history of lumbar fracture
status post repair

EXAM:
LUMBAR SPINE - COMPLETE 4+ VIEW

[l-spine ap]
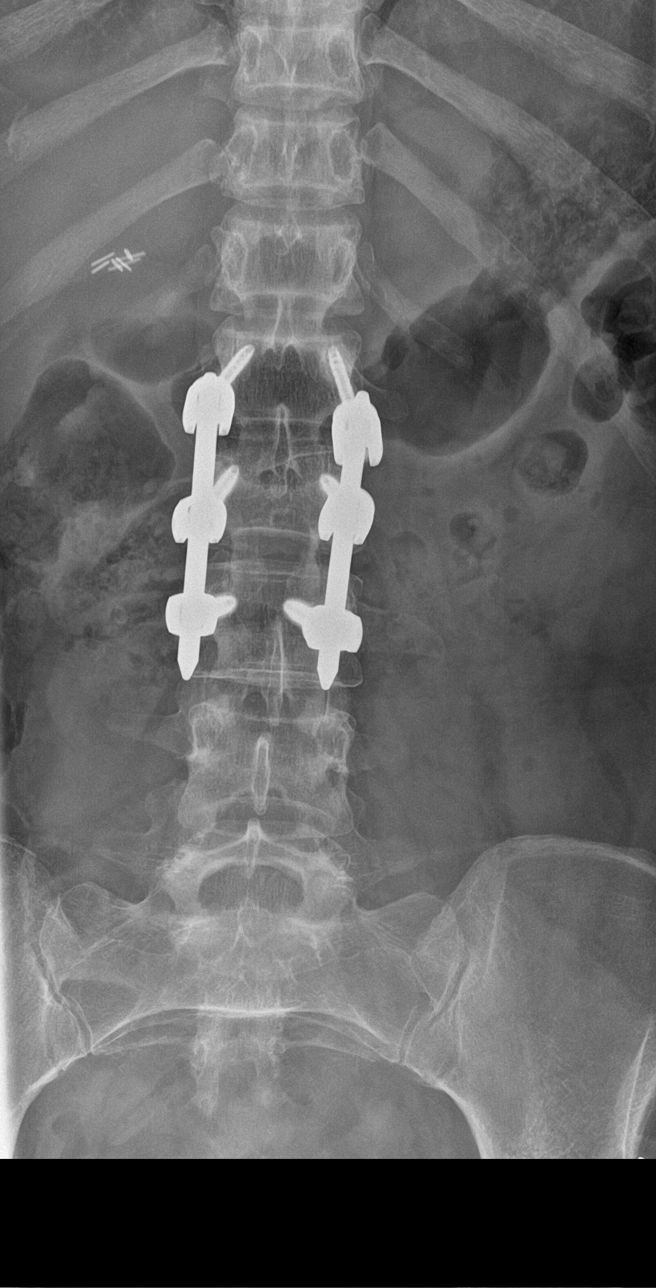

[l-spine obl (1 of 2)]
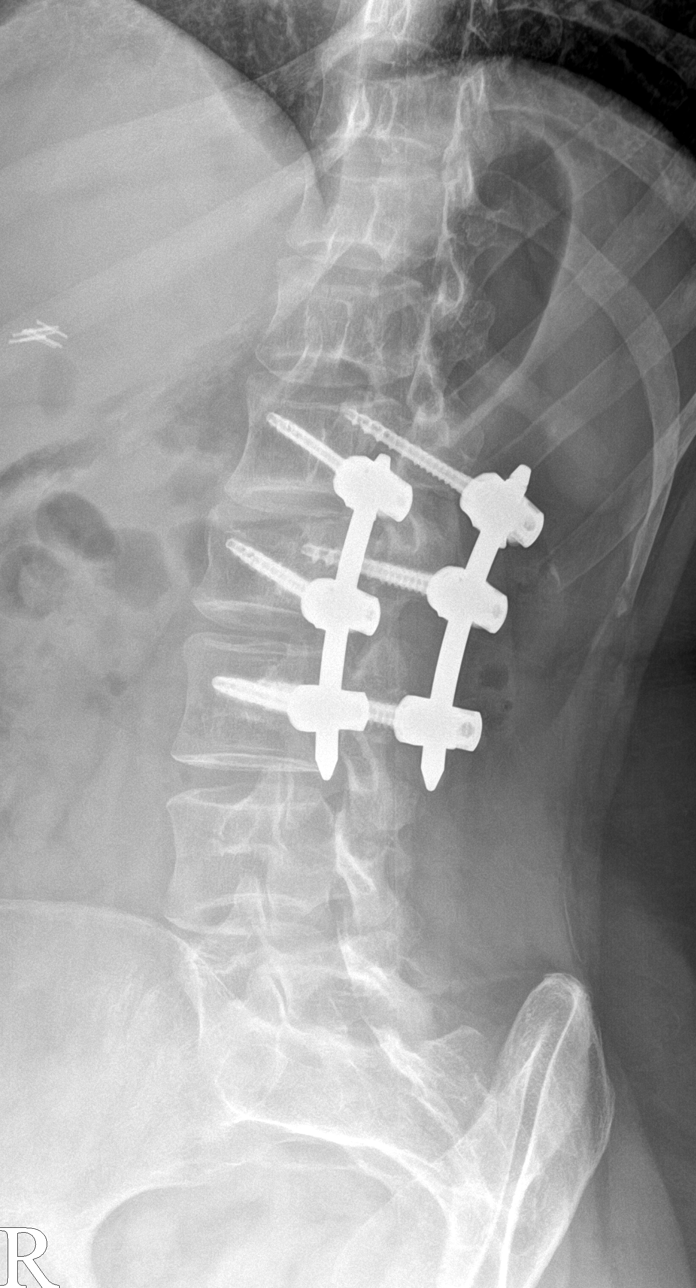

[l-spine obl (2 of 2)]
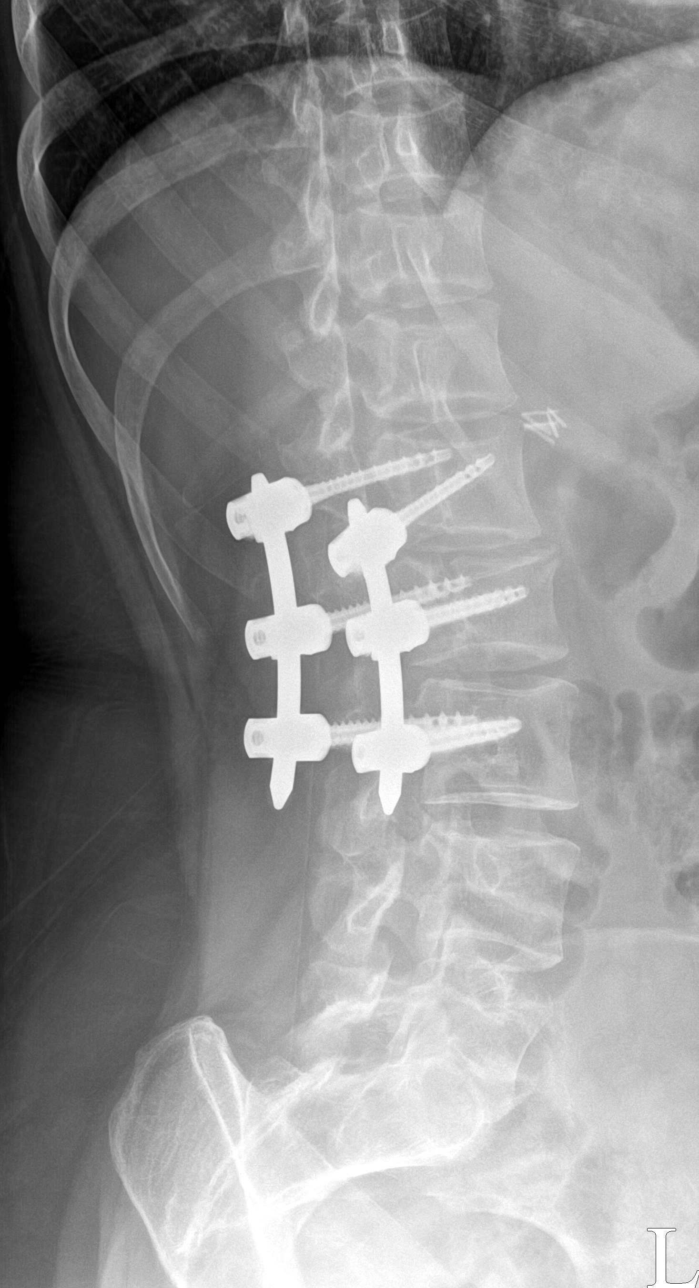

[l-spine lateral]
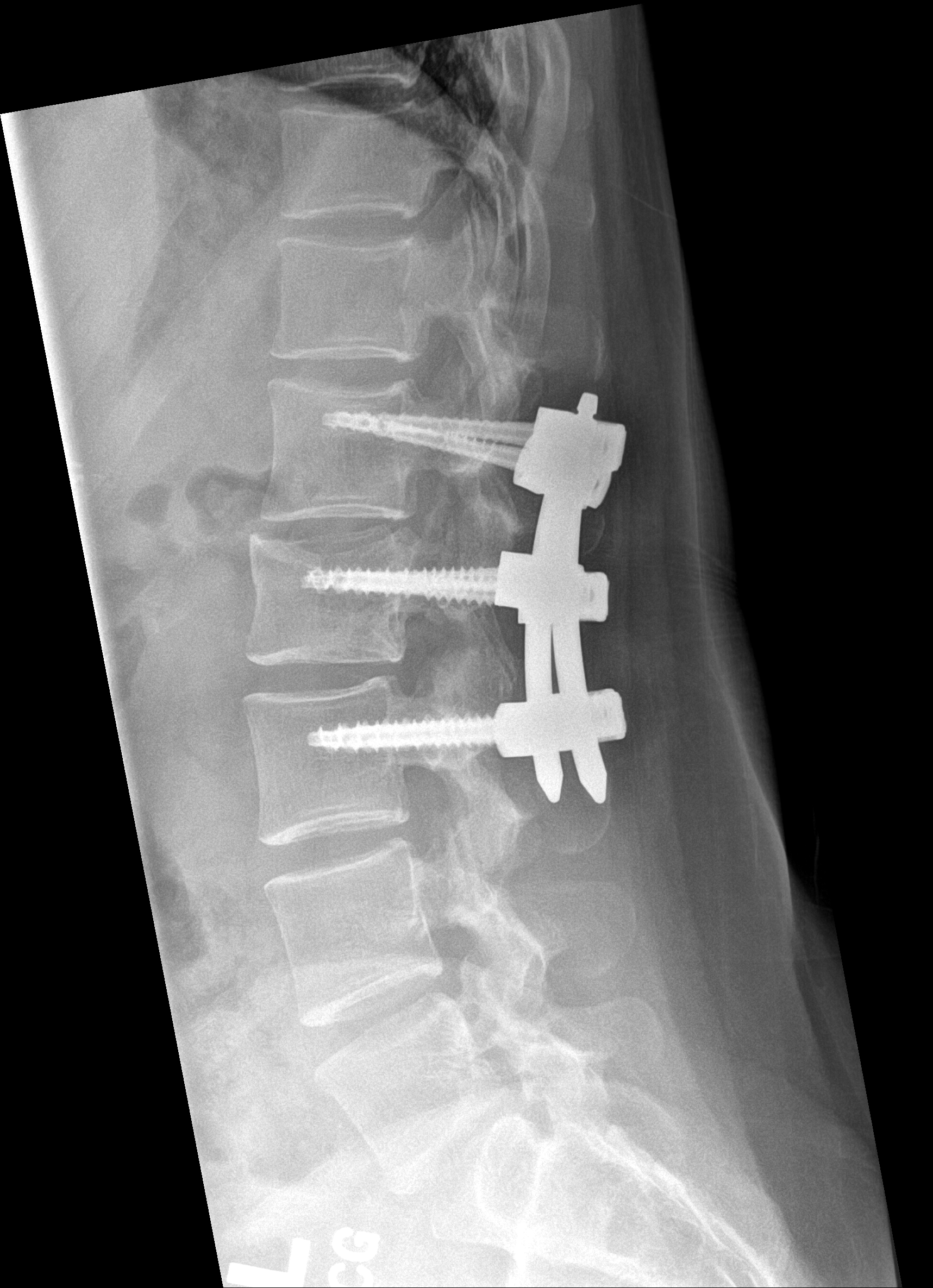

[l-spine spot]
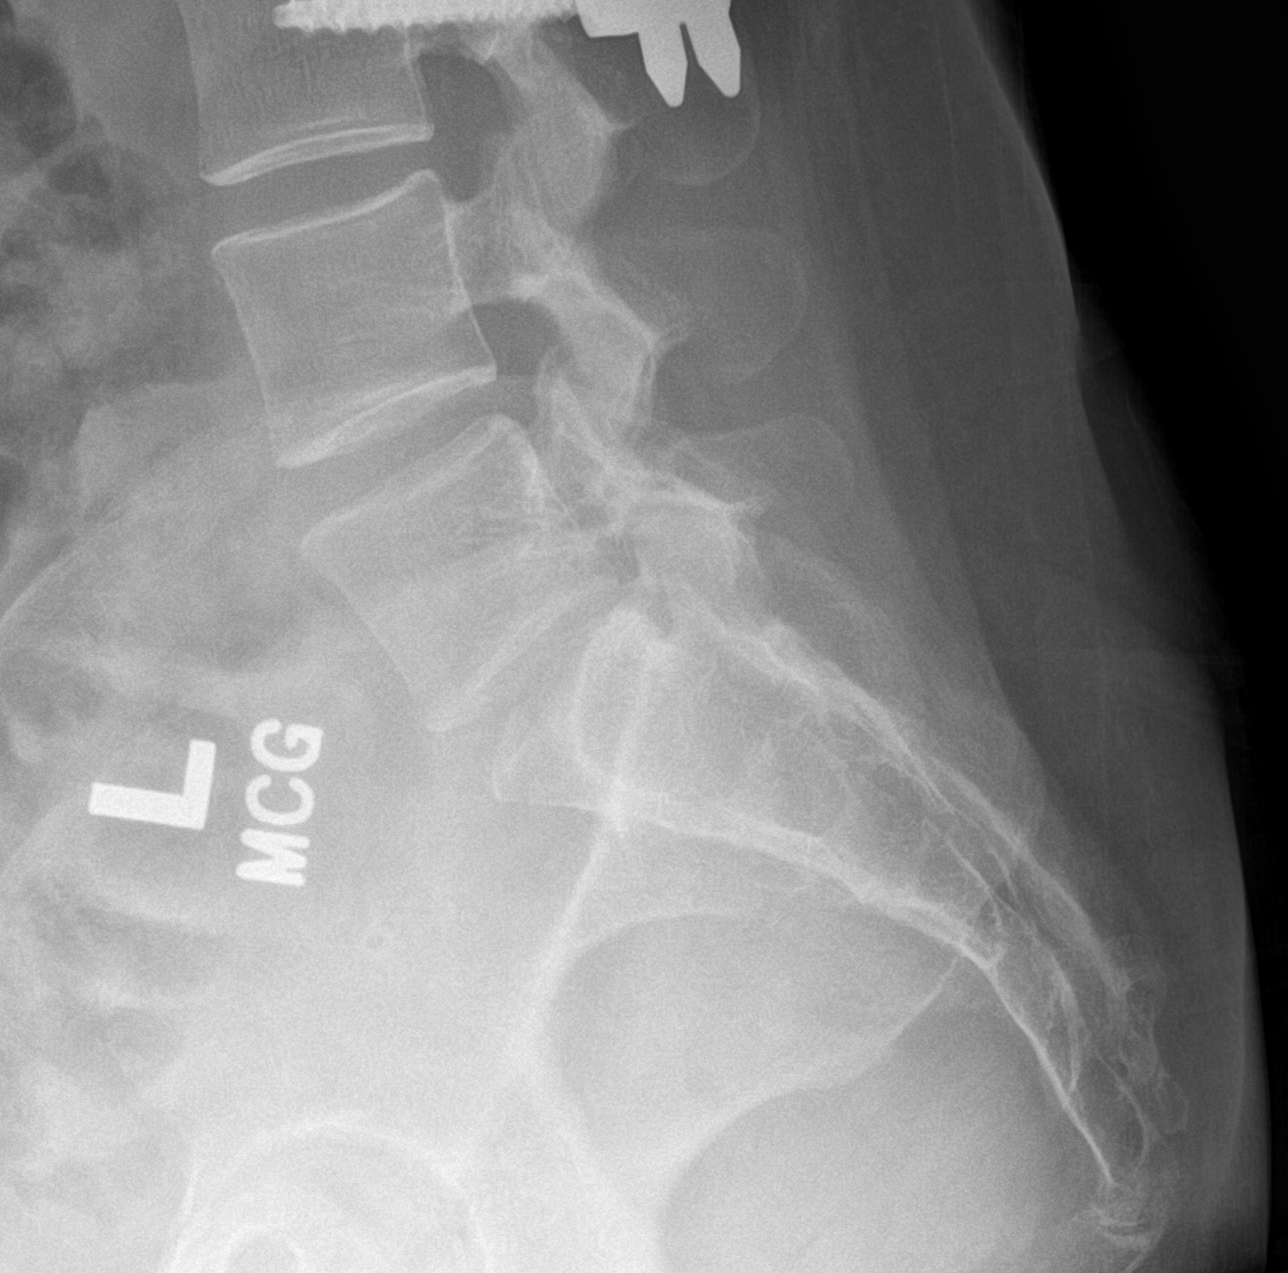

[5 of 5 positions shown; findings below may reference images not displayed]

FINDINGS: Frontal, bilateral oblique, lateral views of the lumbar spine are
obtained. Chronic L2 compression fracture is identified, with
posterior fusion spanning L1, L2, and L3 unchanged. No acute bony
abnormality. Disc spaces are well preserved. Sacroiliac joints are
unremarkable.
IMPRESSION: 1. Stable posterior L1-L3 fusion spanning chronic L2 compression
fracture.
2. No acute bony abnormality.

## 2022-10-01 MED ORDER — ALBUTEROL SULFATE HFA 108 (90 BASE) MCG/ACT IN AERS
2.0000 | INHALATION_SPRAY | Freq: Four times a day (QID) | RESPIRATORY_TRACT | 0 refills | Status: DC | PRN
Start: 2022-10-01 — End: 2022-11-05

## 2022-10-01 NOTE — Telephone Encounter (Signed)
Pt is aware.  

## 2022-10-01 NOTE — Telephone Encounter (Signed)
Refill was sent

## 2022-10-08 ENCOUNTER — Other Ambulatory Visit: Payer: Self-pay

## 2022-10-08 ENCOUNTER — Encounter (HOSPITAL_BASED_OUTPATIENT_CLINIC_OR_DEPARTMENT_OTHER)
Admission: RE | Disposition: A | Discharge status: Home / Self Care | Payer: Self-pay | Source: Home / Self Care | Attending: Obstetrics and Gynecology

## 2022-10-08 ENCOUNTER — Ambulatory Visit (HOSPITAL_BASED_OUTPATIENT_CLINIC_OR_DEPARTMENT_OTHER)
Admission: RE | Admit: 2022-10-08 | Discharge: 2022-10-08 | Disposition: A | Discharge status: Home / Self Care | Payer: 59 | Attending: Obstetrics and Gynecology | Admitting: Obstetrics and Gynecology

## 2022-10-08 ENCOUNTER — Encounter (HOSPITAL_BASED_OUTPATIENT_CLINIC_OR_DEPARTMENT_OTHER): Payer: Self-pay | Admitting: Obstetrics and Gynecology

## 2022-10-08 ENCOUNTER — Ambulatory Visit (HOSPITAL_BASED_OUTPATIENT_CLINIC_OR_DEPARTMENT_OTHER): Payer: 59 | Admitting: Anesthesiology

## 2022-10-08 DIAGNOSIS — N858 Other specified noninflammatory disorders of uterus: Secondary | ICD-10-CM

## 2022-10-08 DIAGNOSIS — R9389 Abnormal findings on diagnostic imaging of other specified body structures: Secondary | ICD-10-CM | POA: Insufficient documentation

## 2022-10-08 DIAGNOSIS — J45909 Unspecified asthma, uncomplicated: Secondary | ICD-10-CM

## 2022-10-08 DIAGNOSIS — F418 Other specified anxiety disorders: Secondary | ICD-10-CM | POA: Diagnosis not present

## 2022-10-08 DIAGNOSIS — N9489 Other specified conditions associated with female genital organs and menstrual cycle: Secondary | ICD-10-CM

## 2022-10-08 DIAGNOSIS — N939 Abnormal uterine and vaginal bleeding, unspecified: Secondary | ICD-10-CM

## 2022-10-08 DIAGNOSIS — Z01818 Encounter for other preprocedural examination: Secondary | ICD-10-CM

## 2022-10-08 DIAGNOSIS — E039 Hypothyroidism, unspecified: Secondary | ICD-10-CM

## 2022-10-08 DIAGNOSIS — N840 Polyp of corpus uteri: Secondary | ICD-10-CM | POA: Diagnosis not present

## 2022-10-08 HISTORY — PX: DILATATION & CURETTAGE/HYSTEROSCOPY WITH MYOSURE: SHX6511

## 2022-10-08 LAB — CBC
HCT: 38.8 % (ref 36.0–46.0)
Hemoglobin: 12.1 g/dL (ref 12.0–15.0)
MCH: 29.6 pg (ref 26.0–34.0)
MCHC: 31.2 g/dL (ref 30.0–36.0)
MCV: 94.9 fL (ref 80.0–100.0)
Platelets: 214 10*3/uL (ref 150–400)
RBC: 4.09 MIL/uL (ref 3.87–5.11)
RDW: 12.4 % (ref 11.5–15.5)
WBC: 5.7 10*3/uL (ref 4.0–10.5)
nRBC: 0 % (ref 0.0–0.2)

## 2022-10-08 LAB — TYPE AND SCREEN

## 2022-10-08 LAB — POCT PREGNANCY, URINE: Preg Test, Ur: NEGATIVE

## 2022-10-08 SURGERY — DILATATION & CURETTAGE/HYSTEROSCOPY WITH MYOSURE
Anesthesia: General | Site: Vagina

## 2022-10-08 MED ORDER — DEXAMETHASONE SODIUM PHOSPHATE 10 MG/ML IJ SOLN
INTRAMUSCULAR | Status: AC
  Filled 2022-10-08: qty 1

## 2022-10-08 MED ORDER — MEPERIDINE HCL 25 MG/ML IJ SOLN: 6.2500 mg | INTRAMUSCULAR | Status: DC | PRN

## 2022-10-08 MED ORDER — ONDANSETRON HCL 4 MG/2ML IJ SOLN
INTRAMUSCULAR | Status: AC
  Filled 2022-10-08: qty 2

## 2022-10-08 MED ORDER — MIDAZOLAM HCL 2 MG/2ML IJ SOLN
INTRAMUSCULAR | Status: DC | PRN
  Administered 2022-10-08: 2 mg via INTRAVENOUS

## 2022-10-08 MED ORDER — LIDOCAINE HCL (PF) 2 % IJ SOLN
INTRAMUSCULAR | Status: AC
  Filled 2022-10-08: qty 5

## 2022-10-08 MED ORDER — DEXAMETHASONE SODIUM PHOSPHATE 10 MG/ML IJ SOLN
INTRAMUSCULAR | Status: DC | PRN
  Administered 2022-10-08 (×2): 5 mg via INTRAVENOUS

## 2022-10-08 MED ORDER — MIDAZOLAM HCL 2 MG/2ML IJ SOLN
INTRAMUSCULAR | Status: AC
  Filled 2022-10-08: qty 2

## 2022-10-08 MED ORDER — OXYCODONE HCL 5 MG/5ML PO SOLN: 5.0000 mg | Freq: Once | ORAL | Status: DC | PRN

## 2022-10-08 MED ORDER — SCOPOLAMINE 1 MG/3DAYS TD PT72
MEDICATED_PATCH | TRANSDERMAL | Status: AC
  Filled 2022-10-08: qty 1

## 2022-10-08 MED ORDER — KETOROLAC TROMETHAMINE 30 MG/ML IJ SOLN
INTRAMUSCULAR | Status: AC
  Filled 2022-10-08: qty 1

## 2022-10-08 MED ORDER — ACETAMINOPHEN 500 MG PO TABS
ORAL_TABLET | ORAL | Status: AC
  Filled 2022-10-08: qty 2

## 2022-10-08 MED ORDER — HYDROMORPHONE HCL 1 MG/ML IJ SOLN
INTRAMUSCULAR | Status: AC
  Filled 2022-10-08: qty 1

## 2022-10-08 MED ORDER — FENTANYL CITRATE (PF) 100 MCG/2ML IJ SOLN
INTRAMUSCULAR | Status: AC
  Filled 2022-10-08: qty 2

## 2022-10-08 MED ORDER — ONDANSETRON HCL 4 MG/2ML IJ SOLN
INTRAMUSCULAR | Status: DC | PRN
  Administered 2022-10-08: 4 mg via INTRAVENOUS

## 2022-10-08 MED ORDER — PROPOFOL 10 MG/ML IV BOLUS
INTRAVENOUS | Status: DC | PRN
  Administered 2022-10-08: 150 mg via INTRAVENOUS

## 2022-10-08 MED ORDER — KETOROLAC TROMETHAMINE 30 MG/ML IJ SOLN: 15.0000 mg | Freq: Once | INTRAMUSCULAR | Status: DC | PRN

## 2022-10-08 MED ORDER — OXYCODONE HCL 5 MG PO TABS: 5.0000 mg | ORAL_TABLET | Freq: Once | ORAL | Status: DC | PRN

## 2022-10-08 MED ORDER — ONDANSETRON HCL 4 MG/2ML IJ SOLN: 4.0000 mg | Freq: Once | INTRAMUSCULAR | Status: DC | PRN

## 2022-10-08 MED ORDER — SCOPOLAMINE 1 MG/3DAYS TD PT72
1.0000 | MEDICATED_PATCH | TRANSDERMAL | Status: DC
  Administered 2022-10-08: 1.5 mg via TRANSDERMAL

## 2022-10-08 MED ORDER — AMISULPRIDE (ANTIEMETIC) 5 MG/2ML IV SOLN: 10.0000 mg | Freq: Once | INTRAVENOUS | Status: DC | PRN

## 2022-10-08 MED ORDER — LIDOCAINE 2% (20 MG/ML) 5 ML SYRINGE
INTRAMUSCULAR | Status: DC | PRN
  Administered 2022-10-08: 50 mg via INTRAVENOUS

## 2022-10-08 MED ORDER — SODIUM CHLORIDE 0.9 % IR SOLN
Status: DC | PRN
  Administered 2022-10-08: 3000 mL

## 2022-10-08 MED ORDER — SILVER NITRATE-POT NITRATE 75-25 % EX MISC
CUTANEOUS | Status: DC | PRN
  Administered 2022-10-08: 4

## 2022-10-08 MED ORDER — KETOROLAC TROMETHAMINE 30 MG/ML IJ SOLN
INTRAMUSCULAR | Status: DC | PRN
  Administered 2022-10-08: 30 mg via INTRAVENOUS

## 2022-10-08 MED ORDER — ACETAMINOPHEN 500 MG PO TABS
1000.0000 mg | ORAL_TABLET | Freq: Once | ORAL | Status: AC
  Administered 2022-10-08: 1000 mg via ORAL

## 2022-10-08 MED ORDER — FENTANYL CITRATE (PF) 100 MCG/2ML IJ SOLN
INTRAMUSCULAR | Status: DC | PRN
  Administered 2022-10-08: 25 ug via INTRAVENOUS
  Administered 2022-10-08: 50 ug via INTRAVENOUS
  Administered 2022-10-08: 25 ug via INTRAVENOUS

## 2022-10-08 MED ORDER — PROPOFOL 10 MG/ML IV BOLUS
INTRAVENOUS | Status: AC
  Filled 2022-10-08: qty 20

## 2022-10-08 MED ORDER — HYDROMORPHONE HCL 1 MG/ML IJ SOLN
0.2500 mg | INTRAMUSCULAR | Status: DC | PRN
  Administered 2022-10-08 (×2): 0.25 mg via INTRAVENOUS

## 2022-10-08 MED ORDER — IBUPROFEN 800 MG PO TABS
800.0000 mg | ORAL_TABLET | Freq: Three times a day (TID) | ORAL | 0 refills | Status: DC | PRN
Start: 2022-10-08 — End: 2023-05-26

## 2022-10-08 MED ORDER — LACTATED RINGERS IV SOLN: INTRAVENOUS | Status: DC

## 2022-10-08 SURGICAL SUPPLY — 18 items
CATH ROBINSON RED A/P 16FR (CATHETERS) ×1 IMPLANT
DEVICE MYOSURE LITE (MISCELLANEOUS) IMPLANT
DEVICE MYOSURE REACH (MISCELLANEOUS) IMPLANT
DILATOR CANAL MILEX (MISCELLANEOUS) IMPLANT
DRSG TELFA 3X8 NADH STRL (GAUZE/BANDAGES/DRESSINGS) ×1 IMPLANT
GAUZE 4X4 16PLY ~~LOC~~+RFID DBL (SPONGE) ×1 IMPLANT
GLOVE BIO SURGEON STRL SZ 6.5 (GLOVE) ×1 IMPLANT
GLOVE BIOGEL M 6.5 STRL (GLOVE) ×1 IMPLANT
GLOVE BIOGEL PI IND STRL 7.0 (GLOVE) ×1 IMPLANT
GOWN STRL REUS W/TWL LRG LVL3 (GOWN DISPOSABLE) ×1 IMPLANT
KIT PROCEDURE FLUENT (KITS) ×1 IMPLANT
KIT TURNOVER CYSTO (KITS) ×1 IMPLANT
PACK VAGINAL MINOR WOMEN LF (CUSTOM PROCEDURE TRAY) ×1 IMPLANT
PAD OB MATERNITY 4.3X12.25 (PERSONAL CARE ITEMS) ×1 IMPLANT
SEAL ROD LENS SCOPE MYOSURE (ABLATOR) ×1 IMPLANT
SLEEVE SCD COMPRESS KNEE MED (STOCKING) ×1 IMPLANT
TOWEL OR 17X24 6PK STRL BLUE (TOWEL DISPOSABLE) ×2 IMPLANT
UNDERPAD 30X36 HEAVY ABSORB (UNDERPADS AND DIAPERS) ×1 IMPLANT

## 2022-10-08 NOTE — Discharge Instructions (Signed)
  Post Anesthesia Home Care Instructions  Activity: Get plenty of rest for the remainder of the day. A responsible individual must stay with you for 24 hours following the procedure.  For the next 24 hours, DO NOT: -Drive a car -Advertising copywriter -Drink alcoholic beverages -Take any medication unless instructed by your physician -Make any legal decisions or sign important papers.  Meals: Start with liquid foods such as gelatin or soup. Progress to regular foods as tolerated. Avoid greasy, spicy, heavy foods. If nausea and/or vomiting occur, drink only clear liquids until the nausea and/or vomiting subsides. Call your physician if vomiting continues.  Special Instructions/Symptoms: Your throat may feel dry or sore from the anesthesia or the breathing tube placed in your throat during surgery. If this causes discomfort, gargle with warm salt water. The discomfort should disappear within 24 hours.  If you had a scopolamine patch placed behind your ear for the management of post- operative nausea and/or vomiting:  1. The medication in the patch is effective for 72 hours, after which it should be removed.  Wrap patch in a tissue and discard in the trash. Wash hands thoroughly with soap and water. 2. You may remove the patch earlier than 72 hours if you experience unpleasant side effects which may include dry mouth, dizziness or visual disturbances. 3. Avoid touching the patch. Wash your hands with soap and water after contact with the patch.    No acetaminophen/Tylenol until after 6:30 pm today if needed.

## 2022-10-08 NOTE — Anesthesia Postprocedure Evaluation (Signed)
Anesthesia Post Note  Patient: Valerie Evans  Procedure(s) Performed: DILATATION & CURETTAGE/HYSTEROSCOPY WITH MYOSURE (Vagina )     Patient location during evaluation: PACU Anesthesia Type: General Level of consciousness: awake and alert, oriented and patient cooperative Pain management: pain level controlled Vital Signs Assessment: post-procedure vital signs reviewed and stable Respiratory status: spontaneous breathing, nonlabored ventilation and respiratory function stable Cardiovascular status: blood pressure returned to baseline and stable Postop Assessment: no apparent nausea or vomiting Anesthetic complications: no   No notable events documented.  Last Vitals:  Vitals:   10/08/22 1500 10/08/22 1515  BP: 101/61 (!) 97/51  Pulse: 81 81  Resp: 20 14  Temp:    SpO2: 99% 97%    Last Pain:  Vitals:   10/08/22 1515  TempSrc:   PainSc: 4                  Lannie Fields

## 2022-10-08 NOTE — H&P (Signed)
Pre Op Dx:   1. Endometrial masses 2. Thickened endometrium on ultrasound (1.96cm)   Post Op Dx:   1. Endometrial polyps 2. Thickened endometrium on ultrasound (1.96cm)   Procedure:   Hysteroscopy with Dilation and Curettage with Myosure polypectomy   Surgeon:  Dr. Bahar Shelden Assistants:  None Anesthesia:  LMA   EBL:  10cc  IVF:  See anesthesia documentation UOP:  Voided prior to arrival to OR Fluid Deficit:  Less than 500cc   Drains:  None Specimen removed:  Endometrial polyps and endometrial curettings - sent to pathology Device(s) implanted: None Case Type:  Clean-contaminated Findings:  Normal-appearing ectocervix and endocervical canal. Endometrium with a large posterior broad-based polyp and an anterior broad-based polyp. Proliferative-appearing endometrium. Bilateral tubal ostia difficult to visualize - the right ostia visualized slightly after removal of anterior polyp. Complications: None Indications:  36 y.o. G0 with endometrial masses and thickened endometrium on ultrasound.   Description of each procedure:  After informed consent was obtained the patient was taken to the operating room in the dorsal supine position.  After administration of general anesthesia, the patient was placed in the dorsal lithotomy position and prepped and draped in the usual sterile fashion. A pre-operative time-out was completed.  The anterior lip of the cervix was grasped with a single-tooth tenaculum and the cervix was serially dilated to accommodate the hysteroscope. The 3.7mm diagnostic hysteroscope was used to further dilate the inner cervical os.  The operative hysteroscope was then advanced and the findings as above was noted. The Myosure Reach was used to resect the endometrial polyps and sample the endometrium. A smooth banjo curette was used to gently curettage the endometrium. The single-tooth tenaculum was removed and its sites were made hemostatic with silver nitrate and pressure.   Adequate hemostasis was noted.  The patient was awakened and extubated and appeared to have tolerated the procedure well.  All counts were correct.   Disposition:  PACU   Santez Woodcox, DO 

## 2022-10-08 NOTE — Interval H&P Note (Signed)
History and Physical Interval Note:  10/08/2022 1:26 PM  Valerie Evans  has presented today for surgery, with the diagnosis of Endometrial Mass.  The various methods of treatment have been discussed with the patient and family. After consideration of risks, benefits and other options for treatment, the patient has consented to  Procedure(s): DILATATION & CURETTAGE/HYSTEROSCOPY WITH MYOSURE (N/A) as a surgical intervention.  The patient's history has been reviewed, patient examined, no change in status, stable for surgery.  I have reviewed the patient's chart and labs.  Questions were answered to the patient's satisfaction.     Steva Ready

## 2022-10-08 NOTE — Transfer of Care (Signed)
Immediate Anesthesia Transfer of Care Note  Patient: Valerie Evans  Procedure(s) Performed: Procedure(s) (LRB): DILATATION & CURETTAGE/HYSTEROSCOPY WITH MYOSURE (N/A)  Patient Location: PACU  Anesthesia Type: General  Level of Consciousness: awake, oriented, sedated and patient cooperative  Airway & Oxygen Therapy: Patient Spontanous Breathing and Patient connected to face mask oxygen  Post-op Assessment: Report given to PACU RN and Post -op Vital signs reviewed and stable  Post vital signs: Reviewed and stable  Complications: No apparent anesthesia complications Last Vitals:  Vitals Value Taken Time  BP 104/63 10/08/22 1438  Temp    Pulse 76 10/08/22 1439  Resp 9 10/08/22 1439  SpO2 99 % 10/08/22 1439  Vitals shown include unvalidated device data.  Last Pain:  Vitals:   10/08/22 1217  TempSrc: Oral         Complications: No notable events documented.

## 2022-10-08 NOTE — Anesthesia Procedure Notes (Signed)
Procedure Name: LMA Insertion Date/Time: 10/08/2022 1:46 PM  Performed by: Francie Massing, CRNAPre-anesthesia Checklist: Patient identified, Emergency Drugs available, Suction available and Patient being monitored Patient Re-evaluated:Patient Re-evaluated prior to induction Oxygen Delivery Method: Circle system utilized Preoxygenation: Pre-oxygenation with 100% oxygen Induction Type: IV induction Ventilation: Mask ventilation without difficulty LMA: LMA inserted LMA Size: 4.0 Number of attempts: 1 Airway Equipment and Method: Bite block Placement Confirmation: positive ETCO2 Tube secured with: Tape Dental Injury: Teeth and Oropharynx as per pre-operative assessment

## 2022-10-08 NOTE — Anesthesia Preprocedure Evaluation (Addendum)
Anesthesia Evaluation  Patient identified by MRN, date of birth, ID band Patient awake    Reviewed: Allergy & Precautions, NPO status , Patient's Chart, lab work & pertinent test results  Airway Mallampati: II  TM Distance: >3 FB Neck ROM: Full    Dental  (+) Teeth Intact, Dental Advisory Given, Chipped,    Pulmonary asthma    Pulmonary exam normal breath sounds clear to auscultation       Cardiovascular negative cardio ROS Normal cardiovascular exam Rhythm:Regular Rate:Normal     Neuro/Psych  Headaches PSYCHIATRIC DISORDERS Anxiety Depression       GI/Hepatic negative GI ROS, Neg liver ROS,,,  Endo/Other  Hypothyroidism    Renal/GU negative Renal ROS     Musculoskeletal negative musculoskeletal ROS (+)    Abdominal   Peds  Hematology negative hematology ROS (+)   Anesthesia Other Findings   Reproductive/Obstetrics Endometrial mass                             Anesthesia Physical Anesthesia Plan  ASA: 2  Anesthesia Plan: General   Post-op Pain Management: Tylenol PO (pre-op)*, Toradol IV (intra-op)* and Precedex   Induction: Intravenous  PONV Risk Score and Plan: 4 or greater and Ondansetron, Dexamethasone, Midazolam and Treatment may vary due to age or medical condition  Airway Management Planned: LMA  Additional Equipment: None  Intra-op Plan:   Post-operative Plan: Extubation in OR  Informed Consent: I have reviewed the patients History and Physical, chart, labs and discussed the procedure including the risks, benefits and alternatives for the proposed anesthesia with the patient or authorized representative who has indicated his/her understanding and acceptance.     Dental advisory given  Plan Discussed with: CRNA  Anesthesia Plan Comments:        Anesthesia Quick Evaluation

## 2022-10-09 NOTE — Op Note (Signed)
Pre Op Dx:   1. Endometrial masses 2. Thickened endometrium on ultrasound (1.96cm)   Post Op Dx:   1. Endometrial polyps 2. Thickened endometrium on ultrasound (1.96cm)   Procedure:   Hysteroscopy with Dilation and Curettage with Myosure polypectomy   Surgeon:  Dr. Steva Ready Assistants:  None Anesthesia:  LMA   EBL:  10cc  IVF:  See anesthesia documentation UOP:  Voided prior to arrival to OR Fluid Deficit:  Less than 500cc   Drains:  None Specimen removed:  Endometrial polyps and endometrial curettings - sent to pathology Device(s) implanted: None Case Type:  Clean-contaminated Findings:  Normal-appearing ectocervix and endocervical canal. Endometrium with a large posterior broad-based polyp and an anterior broad-based polyp. Proliferative-appearing endometrium. Bilateral tubal ostia difficult to visualize - the right ostia visualized slightly after removal of anterior polyp. Complications: None Indications:  37 y.o. G0 with endometrial masses and thickened endometrium on ultrasound.   Description of each procedure:  After informed consent was obtained the patient was taken to the operating room in the dorsal supine position.  After administration of general anesthesia, the patient was placed in the dorsal lithotomy position and prepped and draped in the usual sterile fashion. A pre-operative time-out was completed.  The anterior lip of the cervix was grasped with a single-tooth tenaculum and the cervix was serially dilated to accommodate the hysteroscope. The 3.65mm diagnostic hysteroscope was used to further dilate the inner cervical os.  The operative hysteroscope was then advanced and the findings as above was noted. The Myosure Reach was used to resect the endometrial polyps and sample the endometrium. A smooth banjo curette was used to gently curettage the endometrium. The single-tooth tenaculum was removed and its sites were made hemostatic with silver nitrate and pressure.   Adequate hemostasis was noted.  The patient was awakened and extubated and appeared to have tolerated the procedure well.  All counts were correct.   Disposition:  PACU   Steva Ready, DO

## 2022-10-10 ENCOUNTER — Encounter (HOSPITAL_BASED_OUTPATIENT_CLINIC_OR_DEPARTMENT_OTHER): Payer: Self-pay | Admitting: Obstetrics and Gynecology

## 2022-10-10 LAB — TYPE AND SCREEN
ABO/RH(D): O POS
Antibody Screen: POSITIVE
DAT, IgG: POSITIVE

## 2022-10-10 LAB — SURGICAL PATHOLOGY

## 2022-10-24 ENCOUNTER — Other Ambulatory Visit: Payer: Self-pay | Admitting: Physician Assistant

## 2022-10-27 DIAGNOSIS — N840 Polyp of corpus uteri: Secondary | ICD-10-CM | POA: Diagnosis not present

## 2022-11-05 ENCOUNTER — Other Ambulatory Visit: Payer: Self-pay | Admitting: Family Medicine

## 2022-11-05 DIAGNOSIS — J4521 Mild intermittent asthma with (acute) exacerbation: Secondary | ICD-10-CM

## 2022-11-21 ENCOUNTER — Other Ambulatory Visit: Payer: Self-pay | Admitting: Physician Assistant

## 2022-11-21 NOTE — Telephone Encounter (Signed)
Please call to schedule an appt, has not responded to MyChart message to schedule.

## 2022-11-24 ENCOUNTER — Other Ambulatory Visit: Payer: Self-pay | Admitting: Physician Assistant

## 2022-11-26 NOTE — Telephone Encounter (Signed)
Pt is scheduled 7/16

## 2022-12-12 ENCOUNTER — Ambulatory Visit (INDEPENDENT_AMBULATORY_CARE_PROVIDER_SITE_OTHER): Payer: 59 | Admitting: Physician Assistant

## 2022-12-12 DIAGNOSIS — F411 Generalized anxiety disorder: Secondary | ICD-10-CM | POA: Diagnosis not present

## 2022-12-12 DIAGNOSIS — F4310 Post-traumatic stress disorder, unspecified: Secondary | ICD-10-CM | POA: Diagnosis not present

## 2022-12-12 DIAGNOSIS — M329 Systemic lupus erythematosus, unspecified: Secondary | ICD-10-CM

## 2022-12-12 DIAGNOSIS — F329 Major depressive disorder, single episode, unspecified: Secondary | ICD-10-CM

## 2022-12-12 DIAGNOSIS — F900 Attention-deficit hyperactivity disorder, predominantly inattentive type: Secondary | ICD-10-CM | POA: Diagnosis not present

## 2022-12-12 DIAGNOSIS — F5105 Insomnia due to other mental disorder: Secondary | ICD-10-CM | POA: Diagnosis not present

## 2022-12-12 DIAGNOSIS — F99 Mental disorder, not otherwise specified: Secondary | ICD-10-CM

## 2022-12-16 ENCOUNTER — Other Ambulatory Visit: Payer: Self-pay | Admitting: Physician Assistant

## 2022-12-20 ENCOUNTER — Other Ambulatory Visit: Payer: Self-pay | Admitting: Physician Assistant

## 2022-12-20 ENCOUNTER — Encounter: Payer: Self-pay | Admitting: Physician Assistant

## 2022-12-20 MED ORDER — CLONAZEPAM 1 MG PO TABS: ORAL_TABLET | ORAL | 5 refills | Status: DC

## 2022-12-20 MED ORDER — QUETIAPINE FUMARATE ER 400 MG PO TB24: 800.0000 mg | ORAL_TABLET | Freq: Every day | ORAL | 1 refills | Status: DC
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
## 2022-12-24 ENCOUNTER — Encounter (INDEPENDENT_AMBULATORY_CARE_PROVIDER_SITE_OTHER): Payer: Self-pay

## 2022-12-31 ENCOUNTER — Other Ambulatory Visit: Payer: Self-pay | Admitting: Physician Assistant

## 2023-01-01 ENCOUNTER — Other Ambulatory Visit: Payer: Self-pay

## 2023-01-01 DIAGNOSIS — R110 Nausea: Secondary | ICD-10-CM

## 2023-01-01 MED ORDER — ONDANSETRON 8 MG PO TBDP
8.0000 mg | ORAL_TABLET | Freq: Two times a day (BID) | ORAL | 11 refills | Status: AC | PRN
Start: 2023-01-01 — End: ?

## 2023-01-16 ENCOUNTER — Other Ambulatory Visit: Payer: Self-pay | Admitting: Physician Assistant

## 2023-01-19 ENCOUNTER — Ambulatory Visit: Payer: 59 | Admitting: Family Medicine

## 2023-01-20 ENCOUNTER — Other Ambulatory Visit: Payer: Self-pay | Admitting: Physician Assistant

## 2023-01-21 ENCOUNTER — Other Ambulatory Visit: Payer: Self-pay | Admitting: Family Medicine

## 2023-01-21 DIAGNOSIS — J4521 Mild intermittent asthma with (acute) exacerbation: Secondary | ICD-10-CM

## 2023-01-23 ENCOUNTER — Other Ambulatory Visit: Payer: Self-pay | Admitting: Physician Assistant

## 2023-01-23 ENCOUNTER — Ambulatory Visit (INDEPENDENT_AMBULATORY_CARE_PROVIDER_SITE_OTHER): Payer: 59 | Admitting: Physician Assistant

## 2023-01-23 ENCOUNTER — Encounter: Payer: Self-pay | Admitting: Physician Assistant

## 2023-01-23 DIAGNOSIS — F329 Major depressive disorder, single episode, unspecified: Secondary | ICD-10-CM | POA: Diagnosis not present

## 2023-01-23 DIAGNOSIS — F4310 Post-traumatic stress disorder, unspecified: Secondary | ICD-10-CM | POA: Diagnosis not present

## 2023-01-23 DIAGNOSIS — F5105 Insomnia due to other mental disorder: Secondary | ICD-10-CM | POA: Diagnosis not present

## 2023-01-23 DIAGNOSIS — F99 Mental disorder, not otherwise specified: Secondary | ICD-10-CM | POA: Diagnosis not present

## 2023-01-23 DIAGNOSIS — F411 Generalized anxiety disorder: Secondary | ICD-10-CM
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
## 2023-02-09 ENCOUNTER — Other Ambulatory Visit: Payer: Self-pay | Admitting: Physician Assistant

## 2023-02-18 ENCOUNTER — Other Ambulatory Visit: Payer: Self-pay | Admitting: Physician Assistant

## 2023-03-01 ENCOUNTER — Other Ambulatory Visit: Payer: Self-pay

## 2023-03-01 ENCOUNTER — Emergency Department (HOSPITAL_BASED_OUTPATIENT_CLINIC_OR_DEPARTMENT_OTHER): Payer: 59 | Admitting: Radiology

## 2023-03-01 ENCOUNTER — Encounter (HOSPITAL_BASED_OUTPATIENT_CLINIC_OR_DEPARTMENT_OTHER): Payer: Self-pay

## 2023-03-01 ENCOUNTER — Emergency Department (HOSPITAL_BASED_OUTPATIENT_CLINIC_OR_DEPARTMENT_OTHER)
Admission: EM | Admit: 2023-03-01 | Discharge: 2023-03-01 | Disposition: A | Discharge status: Home / Self Care | Payer: 59 | Attending: Emergency Medicine | Admitting: Emergency Medicine

## 2023-03-01 DIAGNOSIS — Z20822 Contact with and (suspected) exposure to covid-19: Secondary | ICD-10-CM | POA: Insufficient documentation

## 2023-03-01 DIAGNOSIS — J218 Acute bronchiolitis due to other specified organisms: Secondary | ICD-10-CM | POA: Insufficient documentation

## 2023-03-01 DIAGNOSIS — J45909 Unspecified asthma, uncomplicated: Secondary | ICD-10-CM | POA: Diagnosis not present

## 2023-03-01 DIAGNOSIS — Z7952 Long term (current) use of systemic steroids: Secondary | ICD-10-CM | POA: Diagnosis not present

## 2023-03-01 DIAGNOSIS — J219 Acute bronchiolitis, unspecified: Secondary | ICD-10-CM | POA: Diagnosis not present

## 2023-03-01 DIAGNOSIS — R059 Cough, unspecified: Secondary | ICD-10-CM | POA: Diagnosis not present

## 2023-03-01 DIAGNOSIS — B9789 Other viral agents as the cause of diseases classified elsewhere: Secondary | ICD-10-CM | POA: Insufficient documentation

## 2023-03-01 DIAGNOSIS — Z7951 Long term (current) use of inhaled steroids: Secondary | ICD-10-CM | POA: Insufficient documentation

## 2023-03-01 DIAGNOSIS — R0602 Shortness of breath: Secondary | ICD-10-CM | POA: Diagnosis not present

## 2023-03-01 LAB — RESP PANEL BY RT-PCR (RSV, FLU A&B, COVID)  RVPGX2
Influenza A by PCR: NEGATIVE
Influenza B by PCR: NEGATIVE
Resp Syncytial Virus by PCR: NEGATIVE
SARS Coronavirus 2 by RT PCR: NEGATIVE

## 2023-03-01 LAB — PREGNANCY, URINE: Preg Test, Ur: NEGATIVE

## 2023-03-01 MED ORDER — ALBUTEROL SULFATE HFA 108 (90 BASE) MCG/ACT IN AERS
INHALATION_SPRAY | RESPIRATORY_TRACT | Status: AC
  Administered 2023-03-01: 2 via RESPIRATORY_TRACT
  Filled 2023-03-01: qty 6.7

## 2023-03-01 MED ORDER — PREDNISONE 10 MG PO TABS: 50.0000 mg | ORAL_TABLET | Freq: Every day | ORAL | 0 refills | Status: DC

## 2023-03-01 MED ORDER — PREDNISONE 50 MG PO TABS
60.0000 mg | ORAL_TABLET | Freq: Once | ORAL | Status: AC
  Administered 2023-03-01: 60 mg via ORAL
  Filled 2023-03-01: qty 1

## 2023-03-01 MED ORDER — ALBUTEROL SULFATE HFA 108 (90 BASE) MCG/ACT IN AERS: 2.0000 | INHALATION_SPRAY | RESPIRATORY_TRACT | Status: DC | PRN

## 2023-03-01 NOTE — ED Notes (Signed)
Pt ambulatory to restroom, will provide urine sample

## 2023-03-01 NOTE — Discharge Instructions (Signed)
We saw you in the ER for  We think what you have is a viral syndrome - the treatment for which is symptomatic relief only, and your body will fight the infection off in a few days. We are prescribing you some meds for what appears to be bronchitis and mild asthma exacerbation. See your primary care doctor in 1 week if the symptoms dont improve.

## 2023-03-01 NOTE — ED Notes (Signed)
Pt discharged to home using teachback Method. Discharge instructions have been discussed with patient and/or family members. Pt verbally acknowledges understanding d/c instructions, has been given opportunity for questions to be answered, and endorses comprehension to checkout at registration before leaving.  

## 2023-03-01 NOTE — ED Notes (Signed)
Patient transported to X-ray 

## 2023-03-01 NOTE — ED Triage Notes (Signed)
Pt presents with sore throat, productive cough, and ShOB that started this AM. Pt used an albuterol inhaler with temporary relief. Pt with hx of asthma.

## 2023-03-01 NOTE — ED Provider Notes (Signed)
[place] EMERGENCY DEPARTMENT AT [facility] Provider Note   CSN: [id number] Arrival date & time: 03/01/23  1129     History  Chief Complaint  Patient presents with   Cough    Valerie Evans is a 37 y.o. female.  HPI    SUBJECTIVE:  Valerie Evans is a 37 y.o. female who complains of congestion, sore throat, and dry cough for wheezing x 1 days. She denies a history of chills, fevers, and vomiting and has a history of asthma.      Home Medications Prior to Admission medications   Medication Sig Start Date End Date Taking? Authorizing Provider  predniSONE (DELTASONE) 10 MG tablet Take 5 tablets (50 mg total) by mouth daily. 03/02/23  Yes Makelle Marrone, MD  albuterol (VENTOLIN HFA) 108 (90 Base) MCG/ACT inhaler TAKE 2 PUFFS BY MOUTH EVERY 6 HOURS AS NEEDED FOR WHEEZE OR SHORTNESS OF BREATH 01/21/23   Alveria Apley, NP  aspirin EC 81 MG tablet Take by mouth.    [provider]  aspirin-acetaminophen-caffeine (EXCEDRIN MIGRAINE) [phone] MG tablet Take by mouth. 07/04/15   [provider]  buPROPion (WELLBUTRIN XL) 150 MG 24 hr tablet TAKE 3 TABLETS BY MOUTH DAILY 02/18/23   Melony Overly T, PA-C  clonazePAM (KLONOPIN) 1 MG tablet 1 po tid prn, with occas 1/2 during the day prn. 12/20/22   Cherie Ouch, PA-C  fluticasone-salmeterol (ADVAIR DISKUS) 250-50 MCG/ACT AEPB Inhale 1 puff into the lungs in the morning and at bedtime. 07/21/22   Alveria Apley, NP  gabapentin (NEURONTIN) 800 MG tablet TAKE 1 TABLET BY MOUTH THREE TIMES A DAY 02/10/23   Melony Overly T, PA-C  hydroxychloroquine (PLAQUENIL) 200 MG tablet Take 100 mg by mouth daily.    [provider]  ibuprofen (ADVIL) 800 MG tablet Take 1 tablet (800 mg total) by mouth every 8 (eight) hours as needed for moderate pain or cramping. 10/08/22   Steva Ready, DO  levothyroxine (SYNTHROID) 25 MCG tablet TAKE 1 TABLET BY MOUTH EVERY DAY BEFORE BREAKFAST 08/08/22   Alveria Apley, NP  magic mouthwash (lidocaine, diphenhydrAMINE, alum & mag hydroxide) suspension Swish and spit 5 mLs 3 (three) times daily as needed for mouth pain. 12/23/21   Janeece Agee, NP  Multiple Vitamin (MULTIVITAMIN WITH MINERALS) TABS tablet Take 1 tablet by mouth daily.    [provider]  mupirocin ointment (BACTROBAN) 2 % Apply 1 Application topically 2 (two) times daily. 12/23/21   Janeece Agee, NP  ondansetron (ZOFRAN-ODT) 8 MG disintegrating tablet Take 1 tablet (8 mg total) by mouth 2 (two) times daily as needed for nausea or vomiting. 01/01/23   Alveria Apley, NP  PREVIDENT 5000 ENAMEL PROTECT 1.1-5 % GEL Place 1 application onto teeth 2 (two) times daily. 01/21/21   [provider]  QUEtiapine (SEROQUEL XR) 400 MG 24 hr tablet Take 2 tablets (800 mg total) by mouth at bedtime. 12/20/22   Cherie Ouch, PA-C  traZODone (DESYREL) 100 MG tablet Take 1-2 tablets (100-200 mg total) by mouth at bedtime as needed for sleep. 08/11/22   Cherie Ouch, PA-C      Allergies    Patient has no known allergies.    Review of Systems   Review of Systems  All other systems reviewed and are negative.   Physical Exam Updated Vital Signs BP 130/80   Pulse 100   Temp 99.9 F (37.7 C) (Oral)   Resp 20  Ht 5' (1.524 m)   Wt 49.9 kg   LMP 01/27/2023   SpO2 96%   BMI 21.48 kg/m  Physical Exam Vitals and nursing note reviewed.  Constitutional:      Appearance: She is well-developed.  HENT:     Head: Atraumatic.     Mouth/Throat:     Pharynx: Posterior oropharyngeal erythema present. No oropharyngeal exudate.  Cardiovascular:     Rate and Rhythm: Normal rate.  Pulmonary:     Effort: Pulmonary effort is normal.     Breath sounds: Wheezing present. No rhonchi or rales.     Comments: Diffuse end expiratory wheezing Musculoskeletal:     Cervical back: Normal range of motion and neck supple.  Skin:    General: Skin is warm and dry.  Neurological:      Mental Status: She is alert and oriented to person, place, and time.     ED Results / Procedures / Treatments   Labs (all labs ordered are listed, but only abnormal results are displayed) Labs Reviewed  RESP PANEL BY RT-PCR (RSV, FLU A&B, COVID)  [id number]  PREGNANCY, URINE    EKG None  Radiology DG Chest 2 View  Result Date: 03/01/2023 CLINICAL DATA:  Shortness of breath.  Personal history of asthma. EXAM: CHEST - 2 VIEW COMPARISON:  One-view chest x-ray 05/08/2020 FINDINGS: The heart size and mediastinal contours are within normal limits. Both lungs are clear. The visualized skeletal structures are unremarkable. In lumbar spine surgery is noted with pedicle screw and rod fixation L2-3 and L3-4. IMPRESSION: 1. No acute cardiopulmonary disease. 2. Postsurgical changes in the lumbar spine. Electronically Signed   By: Marin Roberts M.D.   On: 03/01/2023 12:40    Procedures Procedures    Medications Ordered in ED Medications  albuterol (VENTOLIN HFA) 108 (90 Base) MCG/ACT inhaler 2 puff (2 puffs Inhalation Given 03/01/23 1327)  predniSONE (DELTASONE) tablet 60 mg (has no administration in time range)    ED Course/ Medical Decision Making/ A&P                                 Medical Decision Making Amount and/or Complexity of Data Reviewed Labs: ordered. Radiology: ordered.  Risk Prescription drug management.   Patient comes in with chief complaint of cough, wheezing, malaise and congestion plus sore throat.  Differential diagnosis includes Viral syndrome including: Influenza COVID-19  Pharyngitis Sinusitis Mononucleosis  Also concerned that patient could be having bronchitis/asthma exacerbation.   ASSESSMENT:  viral upper respiratory illness and bronchitis  PLAN: Symptomatic therapy suggested: push fluids, rest, and return office visit prn if symptoms persist or worsen. Lack of antibiotic effectiveness discussed with her. Call or return to clinic prn if  these symptoms worsen or fail to improve as anticipated.  We will give her prednisone for 4 days and albuterol as needed order. Final Clinical Impression(s) / ED Diagnoses Final diagnoses:  Acute viral bronchiolitis    Rx / DC Orders ED Discharge Orders          Ordered    predniSONE (DELTASONE) 10 MG tablet  Daily        03/01/23 1352              Derwood Kaplan, MD 03/01/23 1355

## 2023-03-16 ENCOUNTER — Other Ambulatory Visit: Payer: Self-pay | Admitting: Family Medicine

## 2023-03-16 ENCOUNTER — Other Ambulatory Visit: Payer: Self-pay | Admitting: Physician Assistant

## 2023-03-16 DIAGNOSIS — J4521 Mild intermittent asthma with (acute) exacerbation: Secondary | ICD-10-CM

## 2023-03-17 ENCOUNTER — Other Ambulatory Visit: Payer: Self-pay | Admitting: Physician Assistant

## 2023-03-17 NOTE — Telephone Encounter (Signed)
Is she taking?

## 2023-03-18 NOTE — Telephone Encounter (Signed)
LVM to [facility] 

## 2023-03-27 ENCOUNTER — Encounter: Payer: Self-pay | Admitting: Physician Assistant

## 2023-03-27 ENCOUNTER — Ambulatory Visit (INDEPENDENT_AMBULATORY_CARE_PROVIDER_SITE_OTHER): Payer: 59 | Admitting: Physician Assistant

## 2023-03-27 DIAGNOSIS — F329 Major depressive disorder, single episode, unspecified: Secondary | ICD-10-CM

## 2023-03-27 DIAGNOSIS — F99 Mental disorder, not otherwise specified: Secondary | ICD-10-CM

## 2023-03-27 DIAGNOSIS — F5105 Insomnia due to other mental disorder: Secondary | ICD-10-CM | POA: Diagnosis not present

## 2023-03-27 DIAGNOSIS — F411 Generalized anxiety disorder: Secondary | ICD-10-CM | POA: Diagnosis not present

## 2023-03-27 DIAGNOSIS — F4310 Post-traumatic stress disorder, unspecified: Secondary | ICD-10-CM | POA: Diagnosis not present

## 2023-03-27 MED ORDER — BUPROPION HCL ER (XL) 150 MG PO TB24: 450.0000 mg | ORAL_TABLET | Freq: Every day | ORAL | 1 refills | Status: DC

## 2023-03-27 MED ORDER — GABAPENTIN 800 MG PO TABS: 800.0000 mg | ORAL_TABLET | Freq: Three times a day (TID) | ORAL | 1 refills | Status: DC

## 2023-03-27 MED ORDER — QUETIAPINE FUMARATE ER 400 MG PO TB24: 800.0000 mg | ORAL_TABLET | Freq: Every day | ORAL | 1 refills | Status: DC
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
## 2023-03-31 ENCOUNTER — Telehealth: Payer: Self-pay

## 2023-03-31 NOTE — Telephone Encounter (Signed)
Transition Care Management Unsuccessful Follow-up Telephone Call  Date of discharge and from where:  101/10/2022 [facility]  Attempts:  1st Attempt  Reason for unsuccessful TCM follow-up call:  Left voice message  Bascom Biel [facility]  [facility], [facility] Guide Direct Dial: [phone]  Website: [email or web address]

## 2023-04-01 ENCOUNTER — Telehealth: Payer: Self-pay

## 2023-04-01 NOTE — Telephone Encounter (Signed)
Transition Care Management Unsuccessful Follow-up Telephone Call  Date of discharge and from where:  03/01/2023 [facility]  Attempts:  2nd Attempt  Reason for unsuccessful TCM follow-up call:  Left voice message  Lesslie Mckeehan [facility]  [facility], [facility] Guide Direct Dial: [phone]  Website: [email or web address]

## 2023-04-20 ENCOUNTER — Telehealth: Payer: 59 | Admitting: Physician Assistant

## 2023-04-20 DIAGNOSIS — R3989 Other symptoms and signs involving the genitourinary system: Secondary | ICD-10-CM | POA: Diagnosis not present

## 2023-04-20 MED ORDER — NITROFURANTOIN MONOHYD MACRO 100 MG PO CAPS
100.0000 mg | ORAL_CAPSULE | Freq: Two times a day (BID) | ORAL | 0 refills | Status: DC
Start: 2023-04-20 — End: 2023-08-19

## 2023-04-20 NOTE — Progress Notes (Signed)
E-Visit for Urinary Problems  We are sorry that you are not feeling well.  Here is how we plan to help!  Based on what you shared with me it looks like you most likely have a simple urinary tract infection.  A UTI (Urinary Tract Infection) is a bacterial infection of the bladder.  Most cases of urinary tract infections are simple to treat but a key part of your care is to encourage you to drink plenty of fluids and watch your symptoms carefully.  I have prescribed MacroBid 100 mg twice a day for 5 days.  Your symptoms should gradually improve. Call us if the burning in your urine worsens, you develop worsening fever, back pain or pelvic pain or if your symptoms do not resolve after completing the antibiotic.  Urinary tract infections can be prevented by drinking plenty of water to keep your body hydrated.  Also be sure when you wipe, wipe from front to back and don't hold it in!  If possible, empty your bladder every 4 hours.  HOME CARE Drink plenty of fluids Compete the full course of the antibiotics even if the symptoms resolve Remember, when you need to go.go. Holding in your urine can increase the likelihood of getting a UTI! GET HELP RIGHT AWAY IF: You cannot urinate You get a high fever Worsening back pain occurs You see blood in your urine You feel sick to your stomach or throw up You feel like you are going to pass out  MAKE SURE YOU  Understand these instructions. Will watch your condition. Will get help right away if you are not doing well or get worse.   Thank you for choosing an e-visit.  Your e-visit answers were reviewed by a board certified advanced clinical practitioner to complete your personal care plan. Depending upon the condition, your plan could have included both over the counter or prescription medications.  Please review your pharmacy choice. Make sure the pharmacy is open so you can pick up prescription now. If there is a problem, you may contact your  provider through MyChart messaging and have the prescription routed to another pharmacy.  Your safety is important to us. If you have drug allergies check your prescription carefully.   For the next 24 hours you can use MyChart to ask questions about today's visit, request a non-urgent call back, or ask for a work or school excuse. You will get an email in the next two days asking about your experience. I hope that your e-visit has been valuable and will speed your recovery.  I have spent 5 minutes in review of e-visit questionnaire, review and updating patient chart, medical decision making and response to patient.   Yavier Snider M Heberto Sturdevant, PA-C  

## 2023-04-27 DIAGNOSIS — Z3169 Encounter for other general counseling and advice on procreation: Secondary | ICD-10-CM | POA: Diagnosis not present

## 2023-04-27 DIAGNOSIS — N939 Abnormal uterine and vaginal bleeding, unspecified: Secondary | ICD-10-CM | POA: Diagnosis not present

## 2023-04-27 DIAGNOSIS — N941 Unspecified dyspareunia: Secondary | ICD-10-CM | POA: Diagnosis not present

## 2023-04-27 DIAGNOSIS — N946 Dysmenorrhea, unspecified: Secondary | ICD-10-CM | POA: Diagnosis not present

## 2023-05-08 ENCOUNTER — Ambulatory Visit (INDEPENDENT_AMBULATORY_CARE_PROVIDER_SITE_OTHER): Payer: 59 | Admitting: Physician Assistant

## 2023-05-08 ENCOUNTER — Encounter: Payer: Self-pay | Admitting: Physician Assistant

## 2023-05-08 DIAGNOSIS — F4310 Post-traumatic stress disorder, unspecified: Secondary | ICD-10-CM | POA: Diagnosis not present

## 2023-05-08 DIAGNOSIS — F411 Generalized anxiety disorder: Secondary | ICD-10-CM

## 2023-05-08 DIAGNOSIS — F329 Major depressive disorder, single episode, unspecified: Secondary | ICD-10-CM

## 2023-05-08 DIAGNOSIS — F410 Panic disorder [episodic paroxysmal anxiety] without agoraphobia: Secondary | ICD-10-CM

## 2023-05-08 MED ORDER — ALPRAZOLAM 1 MG PO TABS: 1.0000 mg | ORAL_TABLET | Freq: Three times a day (TID) | ORAL | 1 refills | Status: DC | PRN
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
## 2023-05-16 ENCOUNTER — Other Ambulatory Visit: Payer: Self-pay | Admitting: Family Medicine

## 2023-05-16 DIAGNOSIS — J4521 Mild intermittent asthma with (acute) exacerbation: Secondary | ICD-10-CM

## 2023-05-26 ENCOUNTER — Emergency Department (HOSPITAL_BASED_OUTPATIENT_CLINIC_OR_DEPARTMENT_OTHER)
Admission: EM | Admit: 2023-05-26 | Discharge: 2023-05-26 | Discharge status: Against Medical Advice | Payer: 59 | Attending: Emergency Medicine | Admitting: Emergency Medicine

## 2023-05-26 ENCOUNTER — Encounter (HOSPITAL_BASED_OUTPATIENT_CLINIC_OR_DEPARTMENT_OTHER): Payer: Self-pay

## 2023-05-26 ENCOUNTER — Other Ambulatory Visit: Payer: Self-pay

## 2023-05-26 ENCOUNTER — Telehealth: Payer: Self-pay | Admitting: Physician Assistant

## 2023-05-26 DIAGNOSIS — Z5321 Procedure and treatment not carried out due to patient leaving prior to being seen by health care provider: Secondary | ICD-10-CM | POA: Diagnosis not present

## 2023-05-26 DIAGNOSIS — F419 Anxiety disorder, unspecified: Secondary | ICD-10-CM | POA: Diagnosis present

## 2023-05-26 NOTE — ED Notes (Signed)
Pt called 2x to room, no answer.

## 2023-05-26 NOTE — Telephone Encounter (Signed)
 Valerie Evans reports that she has a lot of confusion, muscle jerks, her body is constantly shaking. She is currently at the ER at [facility].  She reports that it all started happening two days ago, yesterday was bad but today is worse. She feels as though she is going to pass out.  I told her to call us  back after the ER visit.  She denied any new stressors.

## 2023-05-26 NOTE — Telephone Encounter (Signed)
LVM TO RC

## 2023-05-26 NOTE — Telephone Encounter (Signed)
 Noted.

## 2023-05-26 NOTE — Telephone Encounter (Signed)
Pt called at 9am. Past few days has increase in anxiety, cannot get rid of it, she is having random jerks in her arm, feels a bit dizzy and out of it. Not sure if she is having seratonin syndrome symptoms?

## 2023-05-26 NOTE — ED Triage Notes (Signed)
 Patient worried about possible serotonin syndrome. Just changed her from klonopin to xanax.    Anxiety, body shaking, muscle jerking, elevated HR onset 2 days ago. Denies fever.

## 2023-05-26 NOTE — Telephone Encounter (Signed)
Patient LVM at 11:58 returning the call. # [phone]

## 2023-06-23 ENCOUNTER — Other Ambulatory Visit: Payer: Self-pay | Admitting: Physician Assistant

## 2023-06-26 ENCOUNTER — Encounter: Payer: Self-pay | Admitting: Physician Assistant

## 2023-06-26 ENCOUNTER — Ambulatory Visit (INDEPENDENT_AMBULATORY_CARE_PROVIDER_SITE_OTHER): Payer: 59 | Admitting: Physician Assistant

## 2023-06-26 ENCOUNTER — Telehealth: Payer: Self-pay

## 2023-06-26 DIAGNOSIS — F410 Panic disorder [episodic paroxysmal anxiety] without agoraphobia: Secondary | ICD-10-CM | POA: Diagnosis not present

## 2023-06-26 DIAGNOSIS — F411 Generalized anxiety disorder: Secondary | ICD-10-CM

## 2023-06-26 DIAGNOSIS — Z79899 Other long term (current) drug therapy: Secondary | ICD-10-CM

## 2023-06-26 DIAGNOSIS — F329 Major depressive disorder, single episode, unspecified: Secondary | ICD-10-CM

## 2023-06-26 MED ORDER — ALPRAZOLAM 1 MG PO TABS: 1.0000 mg | ORAL_TABLET | Freq: Three times a day (TID) | ORAL | 1 refills | Status: DC | PRN

## 2023-06-26 MED ORDER — QUETIAPINE FUMARATE 400 MG PO TABS: 800.0000 mg | ORAL_TABLET | Freq: Every day | ORAL | 0 refills | Status: DC

## 2023-06-26 MED ORDER — VORTIOXETINE HBR 5 MG PO TABS: 5.0000 mg | ORAL_TABLET | Freq: Every day | ORAL | 1 refills | Status: DC

## 2023-06-26 MED ORDER — BUPROPION HCL ER (XL) 150 MG PO TB24: 300.0000 mg | ORAL_TABLET | Freq: Every day | ORAL | Status: DC
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
## 2023-06-26 NOTE — Telephone Encounter (Signed)
Prior Authorization submitted for Trintellix 5 mg with [employer], pending response

## 2023-06-29 NOTE — Telephone Encounter (Signed)
 Noted

## 2023-07-19 ENCOUNTER — Other Ambulatory Visit: Payer: Self-pay | Admitting: Family

## 2023-07-19 DIAGNOSIS — J4521 Mild intermittent asthma with (acute) exacerbation: Secondary | ICD-10-CM

## 2023-07-24 ENCOUNTER — Other Ambulatory Visit: Payer: Self-pay | Admitting: Family Medicine

## 2023-07-24 DIAGNOSIS — E039 Hypothyroidism, unspecified: Secondary | ICD-10-CM

## 2023-07-24 NOTE — Telephone Encounter (Signed)
 Pt last TSH was 06/2022. Please advise if okay to fill

## 2023-08-17 ENCOUNTER — Ambulatory Visit (INDEPENDENT_AMBULATORY_CARE_PROVIDER_SITE_OTHER): Payer: 59 | Admitting: Physician Assistant

## 2023-08-17 ENCOUNTER — Encounter: Payer: Self-pay | Admitting: Physician Assistant

## 2023-08-17 DIAGNOSIS — F99 Mental disorder, not otherwise specified: Secondary | ICD-10-CM

## 2023-08-17 DIAGNOSIS — F411 Generalized anxiety disorder: Secondary | ICD-10-CM

## 2023-08-17 DIAGNOSIS — F410 Panic disorder [episodic paroxysmal anxiety] without agoraphobia: Secondary | ICD-10-CM | POA: Diagnosis not present

## 2023-08-17 DIAGNOSIS — F329 Major depressive disorder, single episode, unspecified: Secondary | ICD-10-CM

## 2023-08-17 DIAGNOSIS — F4310 Post-traumatic stress disorder, unspecified: Secondary | ICD-10-CM | POA: Diagnosis not present

## 2023-08-17 DIAGNOSIS — F5105 Insomnia due to other mental disorder: Secondary | ICD-10-CM

## 2023-08-17 MED ORDER — ALPRAZOLAM 1 MG PO TABS: 1.0000 mg | ORAL_TABLET | Freq: Three times a day (TID) | ORAL | 2 refills | Status: DC | PRN

## 2023-08-17 NOTE — Progress Notes (Unsigned)
 [facility]  Patient ID: Valerie Evans,  MRN: [id number]  PCP: Alveria Apley, NP  Date of Evaluation: 08/17/2023 time spent:25 minutes  Chief Complaint:  Chief Complaint   Anxiety; Depression; Insomnia; Follow-up    HISTORY/CURRENT STATUS: HPI for routine med check.   Unable to get Trintellix, even with insurance it was going to be $300. Still feels sad a lot, but not as bad as she has been.    Individual Medical History/ Review of Systems: Changes? :No      Past medications for mental health diagnoses include: Cymbalta, Zoloft, Wellbutrin, Xanax, trazodone, Lexapro, Buspar, Elavil, Sonata Klonopin, Gabapentin, Prozac, Rexulti, Abilify, Modafanil, Seroquel, Wellbutrin plus dextromethorphan to mimic Auvelity was not effective  Viibryd? Trintellix? Insurance wouldn't cover  Allergies: Patient has no known allergies.  Current Medications:  Current Outpatient Medications:    albuterol (VENTOLIN HFA) 108 (90 Base) MCG/ACT inhaler, TAKE 2 PUFFS BY MOUTH EVERY 6 HOURS AS NEEDED FOR WHEEZE OR SHORTNESS OF BREATH, Disp: 8.5 each, Rfl: 1   B Complex Vitamins (B-COMPLEX/B-12) LIQD, Place under the tongue., Disp: , Rfl:    buPROPion (WELLBUTRIN XL) 150 MG 24 hr tablet, Take 2 tablets (300 mg total) by mouth daily., Disp: , Rfl:    gabapentin (NEURONTIN) 800 MG tablet, Take 1 tablet (800 mg total) by mouth 3 (three) times daily., Disp: 270 tablet, Rfl: 1   hydroxychloroquine (PLAQUENIL) 200 MG tablet, Take 100 mg by mouth daily., Disp: , Rfl:    levothyroxine (SYNTHROID) 25 MCG tablet, TAKE 1 TABLET BY MOUTH EVERY DAY BEFORE BREAKFAST, Disp: 30 tablet, Rfl: 0   Multiple Vitamin (MULTIVITAMIN WITH MINERALS) TABS tablet, Take 1 tablet by mouth daily., Disp: , Rfl:    ondansetron (ZOFRAN-ODT) 8 MG disintegrating tablet, Take 1 tablet (8 mg total) by mouth 2 (two) times daily as needed for nausea or vomiting., Disp: 60 tablet, Rfl: 11   QUEtiapine (SEROQUEL) 400 MG tablet,  Take 2 tablets (800 mg total) by mouth at bedtime., Disp: 180 tablet, Rfl: 0   traZODone (DESYREL) 100 MG tablet, Take 1-2 tablets (100-200 mg total) by mouth at bedtime as needed for sleep., Disp: 180 tablet, Rfl: 1   TURMERIC PO, Take by mouth., Disp: , Rfl:    UNABLE TO FIND, ashwaganda, Disp: , Rfl:    ALPRAZolam (XANAX) 1 MG tablet, Take 1 tablet (1 mg total) by mouth 3 (three) times daily as needed for anxiety. May take 1/2 pill during the day occasionally, Disp: 105 tablet, Rfl: 2   aspirin-acetaminophen-caffeine (EXCEDRIN MIGRAINE) 250-250-65 MG tablet, Take by mouth. (Patient not taking: Reported on 03/27/2023), Disp: , Rfl:    fluticasone-salmeterol (ADVAIR DISKUS) 250-50 MCG/ACT AEPB, Inhale 1 puff into the lungs in the morning and at bedtime. (Patient not taking: Reported on 08/17/2023), Disp: 60 each, Rfl: 11   mupirocin ointment (BACTROBAN) 2 %, Apply 1 Application topically 2 (two) times daily. (Patient not taking: Reported on 03/27/2023), Disp: 30 g, Rfl: 11   nitrofurantoin, macrocrystal-monohydrate, (MACROBID) 100 MG capsule, Take 1 capsule (100 mg total) by mouth 2 (two) times daily. (Patient not taking: Reported on 05/08/2023), Disp: 10 capsule, Rfl: 0   tranexamic acid (LYSTEDA) 650 MG TABS tablet, Two tablets Orally Take every 8 hours up to 5 days of heaviest menses for 30 days (Patient not taking: Reported on 06/26/2023), Disp: , Rfl:  Medication Side Effects: none  Family Medical/ Social History: Changes? No  MENTAL HEALTH EXAM:  There were no vitals taken for this  visit.There is no height or weight on file to calculate BMI.   General Appearance: Casual and Well Groomed   Eye Contact:  Good  Speech:  Clear and Coherent and Normal Rate  Volume:  Normal  Mood:  Anxious and sad  Affect:  Anxious  Thought Process:  Goal Directed and Descriptions of Associations: Circumstantial  Orientation:  Full (Time, Place, and Person)  Thought Content: Logical   Suicidal Thoughts:  No   Homicidal Thoughts:  No  Memory:  WNL  Judgement:  Good  Insight:  Good  Psychomotor Activity:  Normal  Concentration:  Concentration: Good and Attention Span: Good  Recall:  Good  Fund of Knowledge: Good  Language: Good  Assets:  Communication Skills Desire for Improvement Financial Resources/Insurance Housing Transportation Vocational/Educational  ADL's:  Intact  Cognition: WNL  Prognosis:  Good   DIAGNOSES:  No diagnosis found.  Receiving Psychotherapy: No   RECOMMENDATIONS:  PDMP reviewed.  Gabapentin filled 08/14/2023.  Xanax filled 08/06/2023.   Continue Xanax 1 mg, 1 tid prn and extra 1/2 during the day occas prn.  Continue Wellbutrin XL 150 mg  2 p.o. daily. Continue Gabapentin 800 mg, 1 tid.  Continue Seroquel 400 mg, 2 p.o. nightly. Continue trazodone 100 mg, 1-2 nightly as needed sleep. Return in 6-8 weeks.   Melony Overly, PA-C

## 2023-08-19 ENCOUNTER — Telehealth: Admitting: Physician Assistant

## 2023-08-19 DIAGNOSIS — R3989 Other symptoms and signs involving the genitourinary system: Secondary | ICD-10-CM

## 2023-08-19 MED ORDER — NITROFURANTOIN MONOHYD MACRO 100 MG PO CAPS
100.0000 mg | ORAL_CAPSULE | Freq: Two times a day (BID) | ORAL | 0 refills | Status: DC
Start: 2023-08-19 — End: 2024-02-22

## 2023-08-19 NOTE — Progress Notes (Signed)

## 2023-08-28 ENCOUNTER — Other Ambulatory Visit: Payer: Self-pay | Admitting: Family Medicine

## 2023-08-28 DIAGNOSIS — E039 Hypothyroidism, unspecified: Secondary | ICD-10-CM

## 2023-10-16 ENCOUNTER — Telehealth (INDEPENDENT_AMBULATORY_CARE_PROVIDER_SITE_OTHER): Admitting: Physician Assistant

## 2023-10-26 DIAGNOSIS — E039 Hypothyroidism, unspecified: Secondary | ICD-10-CM | POA: Diagnosis not present

## 2023-10-26 DIAGNOSIS — N920 Excessive and frequent menstruation with regular cycle: Secondary | ICD-10-CM | POA: Diagnosis not present

## 2023-10-30 ENCOUNTER — Other Ambulatory Visit: Payer: Self-pay | Admitting: Physician Assistant

## 2023-11-02 ENCOUNTER — Encounter: Admitting: Student in an Organized Health Care Education/Training Program

## 2023-11-09 ENCOUNTER — Ambulatory Visit: Admitting: Physician Assistant

## 2023-11-09 ENCOUNTER — Encounter: Payer: Self-pay | Admitting: Physician Assistant

## 2023-11-09 DIAGNOSIS — F329 Major depressive disorder, single episode, unspecified: Secondary | ICD-10-CM | POA: Diagnosis not present

## 2023-11-09 DIAGNOSIS — F4310 Post-traumatic stress disorder, unspecified: Secondary | ICD-10-CM | POA: Diagnosis not present

## 2023-11-09 DIAGNOSIS — F5105 Insomnia due to other mental disorder: Secondary | ICD-10-CM | POA: Diagnosis not present

## 2023-11-09 DIAGNOSIS — Z79899 Other long term (current) drug therapy: Secondary | ICD-10-CM

## 2023-11-09 DIAGNOSIS — N946 Dysmenorrhea, unspecified: Secondary | ICD-10-CM | POA: Diagnosis not present

## 2023-11-09 DIAGNOSIS — F411 Generalized anxiety disorder: Secondary | ICD-10-CM | POA: Diagnosis not present

## 2023-11-09 DIAGNOSIS — F99 Mental disorder, not otherwise specified: Secondary | ICD-10-CM

## 2023-11-09 DIAGNOSIS — F410 Panic disorder [episodic paroxysmal anxiety] without agoraphobia: Secondary | ICD-10-CM

## 2023-11-09 DIAGNOSIS — Z01419 Encounter for gynecological examination (general) (routine) without abnormal findings: Secondary | ICD-10-CM | POA: Diagnosis not present

## 2023-11-09 DIAGNOSIS — Z124 Encounter for screening for malignant neoplasm of cervix: Secondary | ICD-10-CM | POA: Diagnosis not present

## 2023-11-09 DIAGNOSIS — N83292 Other ovarian cyst, left side: Secondary | ICD-10-CM | POA: Diagnosis not present

## 2023-11-09 DIAGNOSIS — N920 Excessive and frequent menstruation with regular cycle: Secondary | ICD-10-CM | POA: Diagnosis not present

## 2023-11-09 DIAGNOSIS — N9489 Other specified conditions associated with female genital organs and menstrual cycle: Secondary | ICD-10-CM | POA: Diagnosis not present

## 2023-11-09 DIAGNOSIS — E039 Hypothyroidism, unspecified: Secondary | ICD-10-CM | POA: Diagnosis not present

## 2023-11-09 MED ORDER — GABAPENTIN 800 MG PO TABS: 800.0000 mg | ORAL_TABLET | Freq: Three times a day (TID) | ORAL | 1 refills | Status: DC

## 2023-11-09 MED ORDER — ALPRAZOLAM 1 MG PO TABS: ORAL_TABLET | ORAL | 1 refills | Status: DC

## 2023-11-09 MED ORDER — BUPROPION HCL ER (XL) 150 MG PO TB24: 300.0000 mg | ORAL_TABLET | Freq: Every day | ORAL | 1 refills | Status: DC

## 2023-11-09 MED ORDER — QUETIAPINE FUMARATE 400 MG PO TABS: 800.0000 mg | ORAL_TABLET | Freq: Every day | ORAL | 1 refills | Status: DC
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
## 2023-11-30 DIAGNOSIS — Z3202 Encounter for pregnancy test, result negative: Secondary | ICD-10-CM | POA: Diagnosis not present

## 2023-11-30 DIAGNOSIS — N946 Dysmenorrhea, unspecified: Secondary | ICD-10-CM | POA: Diagnosis not present

## 2023-11-30 DIAGNOSIS — N9489 Other specified conditions associated with female genital organs and menstrual cycle: Secondary | ICD-10-CM | POA: Diagnosis not present

## 2023-11-30 DIAGNOSIS — N83292 Other ovarian cyst, left side: Secondary | ICD-10-CM | POA: Diagnosis not present

## 2023-11-30 DIAGNOSIS — N920 Excessive and frequent menstruation with regular cycle: Secondary | ICD-10-CM | POA: Diagnosis not present

## 2023-11-30 DIAGNOSIS — R9389 Abnormal findings on diagnostic imaging of other specified body structures: Secondary | ICD-10-CM | POA: Diagnosis not present

## 2023-12-21 ENCOUNTER — Ambulatory Visit (INDEPENDENT_AMBULATORY_CARE_PROVIDER_SITE_OTHER): Admitting: Physician Assistant

## 2023-12-21 ENCOUNTER — Encounter: Payer: Self-pay | Admitting: Physician Assistant

## 2023-12-21 DIAGNOSIS — F5105 Insomnia due to other mental disorder: Secondary | ICD-10-CM | POA: Diagnosis not present

## 2023-12-21 DIAGNOSIS — F4310 Post-traumatic stress disorder, unspecified: Secondary | ICD-10-CM

## 2023-12-21 DIAGNOSIS — F329 Major depressive disorder, single episode, unspecified: Secondary | ICD-10-CM

## 2023-12-21 DIAGNOSIS — F411 Generalized anxiety disorder: Secondary | ICD-10-CM

## 2023-12-21 DIAGNOSIS — F410 Panic disorder [episodic paroxysmal anxiety] without agoraphobia: Secondary | ICD-10-CM | POA: Diagnosis not present

## 2023-12-21 DIAGNOSIS — F99 Mental disorder, not otherwise specified: Secondary | ICD-10-CM

## 2023-12-21 MED ORDER — ALPRAZOLAM 1 MG PO TABS: ORAL_TABLET | ORAL | 1 refills | Status: DC
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
## 2024-01-07 ENCOUNTER — Other Ambulatory Visit: Payer: Self-pay | Admitting: Family Medicine

## 2024-01-07 DIAGNOSIS — R110 Nausea: Secondary | ICD-10-CM

## 2024-02-22 ENCOUNTER — Encounter: Payer: Self-pay | Admitting: Physician Assistant

## 2024-02-22 ENCOUNTER — Telehealth (INDEPENDENT_AMBULATORY_CARE_PROVIDER_SITE_OTHER): Admitting: Physician Assistant

## 2024-02-22 DIAGNOSIS — F4310 Post-traumatic stress disorder, unspecified: Secondary | ICD-10-CM | POA: Diagnosis not present

## 2024-02-22 DIAGNOSIS — F329 Major depressive disorder, single episode, unspecified: Secondary | ICD-10-CM

## 2024-02-22 DIAGNOSIS — F410 Panic disorder [episodic paroxysmal anxiety] without agoraphobia: Secondary | ICD-10-CM

## 2024-02-22 DIAGNOSIS — F411 Generalized anxiety disorder: Secondary | ICD-10-CM | POA: Diagnosis not present

## 2024-02-22 MED ORDER — ALPRAZOLAM 1 MG PO TABS: ORAL_TABLET | ORAL | 2 refills | Status: DC

## 2024-02-22 MED ORDER — QUETIAPINE FUMARATE 400 MG PO TABS: 800.0000 mg | ORAL_TABLET | Freq: Every day | ORAL | 1 refills | Status: AC

## 2024-02-22 MED ORDER — GABAPENTIN 800 MG PO TABS: 800.0000 mg | ORAL_TABLET | Freq: Three times a day (TID) | ORAL | 1 refills | Status: AC

## 2024-02-22 MED ORDER — BUPROPION HCL ER (XL) 150 MG PO TB24: 300.0000 mg | ORAL_TABLET | Freq: Every day | ORAL | 1 refills | Status: AC

## 2024-02-22 NOTE — Patient Instructions (Signed)
 Change the Ashwaganda Capsule to the root extract and put in a smoothie, tea, or something like that, every morning. Add Magnesium Glycinate about 1-2 hours before bedtime.

## 2024-02-22 NOTE — Progress Notes (Signed)
 [facility]  Patient ID: Valerie Evans,  MRN: [id number]  PCP: Billy Philippe SAUNDERS, NP  Date of Evaluation: 02/22/2024 Time spent:25 minutes  Chief Complaint:  Chief Complaint   Anxiety; Insomnia; Depression; Follow-up    Virtual Visit via Telehealth  I connected with patient by a video enabled telemedicine application with their informed consent, and verified patient privacy and that I am speaking with the correct person using two identifiers.  I am private, in my office and the patient is at home.  I discussed the limitations, risks, security and privacy concerns of performing an evaluation and management service by video and the availability of in person appointments. I also discussed with the patient that there may be a patient responsible charge related to this service. The patient expressed understanding and agreed to proceed.   I discussed the assessment and treatment plan with the patient. The patient was provided an opportunity to ask questions and all were answered. The patient agreed with the plan and demonstrated an understanding of the instructions.   The patient was advised to call back or seek an in-person evaluation if the symptoms worsen or if the condition fails to improve as anticipated.  I provided approximately 25 minutes of non-face-to-face time during this encounter.  HISTORY/CURRENT STATUS: HPI  For 2 month med check.   Went to [employer] [place] this summer and enjoyed that.  Energy and motivation are good.  Still works at [employer].  Working on getting to work 4 days a week, so she'll be part-time and have benefits.   No extreme sadness, tearfulness, or feelings of hopelessness.  ADLs and personal hygiene are normal.  No memory changes.   Appetite has not changed.  No mania, delirium, AH/VH.  No SI/HI.  A new problem going on for a few weeks is tachycardia only at night.  Not every night but often enough for her to need to mention it.  It seems  like  when she's drifting off to sleep, or has been asleep for a few mins, she wakes up and her heart is pounding.  She hasn't checked her heart rate, but it feels like it's flying.  The length of time varies.  [name] had to take an extra half of a Xanax , then calms down and can go back to sleep. No SOB.  Doesn't happen during the day. Still needs the Xanax  routinely or she gets really panicky. No nightmares that she remembers, but when she wakes up, it feels like when she's woken up from a nightmare.  Individual Medical History/ Review of Systems: Changes? :Yes     Uterine polyps and endometrial lesion surgery soon Has had more joint pain, not sure if it's the weather, lupus flare, or what.   Past medications for mental health diagnoses include: Cymbalta, Zoloft, Wellbutrin , Xanax , trazodone , Lexapro, Buspar, Elavil, Sonata Klonopin , Gabapentin , Prozac , Rexulti , Abilify , Modafanil, Seroquel , Wellbutrin  plus dextromethorphan  to mimic Auvelity  was not effective  Viibryd ? Trintellix ? Insurance wouldn't cover  Allergies: Patient has no known allergies.  Current Medications:  Current Outpatient Medications:    albuterol  (VENTOLIN  HFA) 108 (90 Base) MCG/ACT inhaler, TAKE 2 PUFFS BY MOUTH EVERY 6 HOURS AS NEEDED FOR WHEEZE OR SHORTNESS OF BREATH, Disp: 8.5 each, Rfl: 1   B Complex Vitamins (B-COMPLEX/B-12) LIQD, Place under the tongue., Disp: , Rfl:    hydroxychloroquine  (PLAQUENIL ) 200 MG tablet, Take 100 mg by mouth daily., Disp: , Rfl:  levothyroxine  (SYNTHROID ) 25 MCG tablet, TAKE 1 TABLET BY MOUTH EVERY DAY BEFORE BREAKFAST, Disp: 30 tablet, Rfl: 0   Multiple Vitamin (MULTIVITAMIN WITH MINERALS) TABS tablet, Take 1 tablet by mouth daily., Disp: , Rfl:    mupirocin  ointment (BACTROBAN ) 2 %, Apply 1 Application topically 2 (two) times daily., Disp: 30 g, Rfl: 11   ondansetron  (ZOFRAN -ODT) 8 MG disintegrating tablet, Take 1 tablet (8 mg total) by mouth 2 (two) times daily as needed for nausea or  vomiting., Disp: 60 tablet, Rfl: 11   traZODone  (DESYREL ) 100 MG tablet, Take 1-2 tablets (100-200 mg total) by mouth at bedtime as needed for sleep., Disp: 180 tablet, Rfl: 1   TURMERIC PO, Take by mouth., Disp: , Rfl:    UNABLE TO FIND, ashwaganda, Disp: , Rfl:    Vitamin D-Vitamin K (VITAMIN K2-VITAMIN D3) 90-125 MCG CAPS, Vitamin K2-Vitamin D3, Disp: , Rfl:    ALPRAZolam  (XANAX ) 1 MG tablet, 1 po q 4-6 hours prn.  Max 4 pills in 24 hours., Disp: 120 tablet, Rfl: 2   aspirin-acetaminophen -caffeine (EXCEDRIN MIGRAINE) 250-250-65 MG tablet, Take by mouth. (Patient not taking: Reported on 02/22/2024), Disp: , Rfl:    buPROPion  (WELLBUTRIN  XL) 150 MG 24 hr tablet, Take 2 tablets (300 mg total) by mouth daily., Disp: 180 tablet, Rfl: 1   fluticasone -salmeterol (ADVAIR DISKUS) 250-50 MCG/ACT AEPB, Inhale 1 puff into the lungs in the morning and at bedtime. (Patient not taking: Reported on 02/22/2024), Disp: 60 each, Rfl: 11   gabapentin  (NEURONTIN ) 800 MG tablet, Take 1 tablet (800 mg total) by mouth 3 (three) times daily., Disp: 270 tablet, Rfl: 1   QUEtiapine  (SEROQUEL ) 400 MG tablet, Take 2 tablets (800 mg total) by mouth at bedtime., Disp: 180 tablet, Rfl: 1 Medication Side Effects: none  Family Medical/ Social History: Changes? 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   MENTAL HEALTH EXAM:  There were no vitals taken for this visit.There is no height or weight on file to calculate BMI.   General Appearance: Casual and Well Groomed   Eye Contact:  Good  Speech:  Clear and Coherent and Normal Rate  Volume:  Normal  Mood:  Euthymic  Affect:  Congruent  Thought Process:  Goal Directed and Descriptions of Associations: Circumstantial  Orientation:  Full (Time, Place, and Person)  Thought Content: Logical   Suicidal Thoughts:  No  Homicidal Thoughts:  No  Memory:  WNL  Judgement:  Good  Insight:  Good  Psychomotor Activity:  Normal  Concentration:  Concentration: Good  and Attention Span: Good  Recall:  Good  Fund of Knowledge: Good  Language: Good  Assets:  Communication Skills Desire for Improvement Financial Resources/Insurance Housing Resilience Transportation Vocational/Educational  ADL's:  Intact  Cognition: WNL  Prognosis:  Good   PCP follows labs.  DIAGNOSES:    ICD-10-CM   1. 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 PTSD (post-traumatic stress disorder)  F43.10     4. Panic attacks  F41.0      Receiving Psychotherapy: No   RECOMMENDATIONS:  PDMP reviewed.  Gabapentin  filled 02/03/2024.  Xanax  filled 02/03/2024. I provided approximately  25 minutes of non-face-to-face time during this encounter, including time spent before and after the visit in records review, medical decision making, counseling pertinent to today's visit, and charting.   I provided approx 18 minutes in counseling concerning sleep hygiene. No screen time within a  few hours of bedtime. Don't sleep with the tv on, even without sound.  The light can be a problem. No caffeine after 2 PM. Add Magnesium Glycinate a few hours before bedtime. Consider adding Propranolol if these changes alone aren't enough.  Recommend she check her blood pressure and pulse rate randomly but definitely when she has what sounds like a panic attack.  Discussed changing Ashwaganda caps to root extract in the mornings.   Continue Xanax  1 mg, 1 p.o. every 4-6 hours as needed. Continue Wellbutrin  XL 150 mg  2 p.o. daily. Continue Gabapentin  800 mg, 1 tid.  Continue Seroquel  400 mg, 2 p.o. nightly. Continue trazodone  100 mg, 1-2 nightly as needed sleep. She rarely takes.  Change Ashwaganda capsules to root extract every morning. Add magnesium glycinate approximately 1 to 2 hours before bed. Continue all other vitamins and supplements as per med list. Return in 4-6 weeks.   Verneita Cooks, PA-C

## 2024-03-17 ENCOUNTER — Telehealth: Admitting: Physician Assistant

## 2024-03-17 DIAGNOSIS — R3989 Other symptoms and signs involving the genitourinary system: Secondary | ICD-10-CM | POA: Diagnosis not present

## 2024-03-17 MED ORDER — CEPHALEXIN 500 MG PO CAPS: 500.0000 mg | ORAL_CAPSULE | Freq: Two times a day (BID) | ORAL | 0 refills | Status: AC

## 2024-03-17 NOTE — Progress Notes (Signed)

## 2024-03-28 DIAGNOSIS — R9389 Abnormal findings on diagnostic imaging of other specified body structures: Secondary | ICD-10-CM | POA: Diagnosis not present

## 2024-03-28 DIAGNOSIS — N946 Dysmenorrhea, unspecified: Secondary | ICD-10-CM | POA: Diagnosis not present

## 2024-03-28 DIAGNOSIS — N9489 Other specified conditions associated with female genital organs and menstrual cycle: Secondary | ICD-10-CM | POA: Diagnosis not present

## 2024-03-28 DIAGNOSIS — N920 Excessive and frequent menstruation with regular cycle: Secondary | ICD-10-CM | POA: Diagnosis not present

## 2024-05-04 DIAGNOSIS — E039 Hypothyroidism, unspecified: Secondary | ICD-10-CM | POA: Diagnosis not present

## 2024-05-21 ENCOUNTER — Encounter

## 2024-05-24 ENCOUNTER — Encounter: Payer: Self-pay | Admitting: Obstetrics and Gynecology

## 2024-05-27 ENCOUNTER — Telehealth: Admitting: Physician Assistant

## 2024-05-27 DIAGNOSIS — J4541 Moderate persistent asthma with (acute) exacerbation: Secondary | ICD-10-CM | POA: Diagnosis not present

## 2024-05-27 MED ORDER — ALBUTEROL SULFATE HFA 108 (90 BASE) MCG/ACT IN AERS: 1.0000 | INHALATION_SPRAY | Freq: Four times a day (QID) | RESPIRATORY_TRACT | 0 refills | Status: AC | PRN

## 2024-05-27 NOTE — Progress Notes (Signed)
 E Visit for Asthma  Based on what you have shared with me, it looks like you may have a flare up of your asthma.  Asthma is a chronic (ongoing) lung disease which results in airway obstruction, inflammation and hyper-responsiveness.   Asthma symptoms vary from person to person, with common symptoms including nighttime awakening and decreased ability to participate in normal activities due to shortness of breath. It is often triggered by changes in weather, changes in the season, changes in air temperature, or inside (home, school, daycare or work) allergens such as animal dander, mold, mildew, woodstoves or cockroaches.   It can also be triggered by hormonal changes, extreme emotion, physical exertion or an upper respiratory tract illness.     It is important to identify the trigger and eliminate or avoid the trigger if possible.   If you have been prescribed medications to be taken on a regular basis, it is important to follow the asthma action plan and to follow guidelines to adjust medication in response to increasing symptoms of decreased peak expiratory flow rate.  Treatment: I have prescribed: Albuterol  (Proventil  HFA; Ventolin  HFA) 108 (90 Base) MCG/ACT Inhaler 2 puffs into the lungs every six hours as needed for wheezing or shortness of breath  HOME CARE Only take medications as instructed by your medical team. Consider wearing a mask or scarf to improve breathing when there is poor air quality as this has been shown to decrease irritation and decrease exacerbations Get rest. Using a humidifier may help nasal congestion and ease sore throat pain. Using a saline nasal spray works much the same way.  Cough drops, hard candies and sore throat lozenges may ease your cough.  Avoid close contacts, especially the very you and the elderly. Cover your mouth if you cough or  sneeze. Always remember to wash your hands.   GET HELP RIGHT AWAY IF: You develop worsening shortness of breath/difficulty breathing or chest tightness, breathlessness at rest, drowsy, confused or agitated, unable to speak in full sentences, or if you develop chest pain.  You have coughing fits. You develop a severe headache or visual changes. You develop shortness of breath, difficulty breathing or start having chest pain. Your symptoms persist after you have completed your treatment plan. If your symptoms do not improve within 5 days.  MAKE SURE YOU Understand these instructions. Will watch your condition. Will get help right away if you are not doing well or get worse.   Your e-visit answers were reviewed by a board certified advanced clinical practitioner to complete your personal care plan, Depending upon the condition, your plan could have included both over the counter or prescription medications.  Please review your pharmacy choice. Your safety is important to us . If you have drug allergies check your prescription carefully. You can use MyChart to ask questions about today's visit, request a non-urgent call back, or ask for a work or school excuse for 24 hours related to this e-Visit. If it has been greater than 24 hours you will need to follow up with your provider, or enter a new e-Visit to address those concerns.  You will get an e-mail in the next two days asking about your experience. I hope that your e-visit has been valuable and will speed your recovery. Thank you for using e-visits.  I have spent 5 minutes in review of e-visit questionnaire, review and updating patient chart, medical decision making and response to patient.   Delon CHRISTELLA Dickinson, PA-C

## 2024-06-20 ENCOUNTER — Ambulatory Visit: Payer: Self-pay | Admitting: Obstetrics and Gynecology

## 2024-06-27 ENCOUNTER — Encounter: Payer: Self-pay | Admitting: Physician Assistant

## 2024-06-27 ENCOUNTER — Ambulatory Visit: Admitting: Physician Assistant

## 2024-06-27 DIAGNOSIS — Z79899 Other long term (current) drug therapy: Secondary | ICD-10-CM

## 2024-06-27 DIAGNOSIS — F411 Generalized anxiety disorder: Secondary | ICD-10-CM

## 2024-06-27 DIAGNOSIS — F329 Major depressive disorder, single episode, unspecified: Secondary | ICD-10-CM

## 2024-06-27 DIAGNOSIS — F4310 Post-traumatic stress disorder, unspecified: Secondary | ICD-10-CM

## 2024-06-27 DIAGNOSIS — F5105 Insomnia due to other mental disorder: Secondary | ICD-10-CM

## 2024-06-27 DIAGNOSIS — F410 Panic disorder [episodic paroxysmal anxiety] without agoraphobia: Secondary | ICD-10-CM

## 2024-06-27 MED ORDER — ALPRAZOLAM 1 MG PO TABS: ORAL_TABLET | ORAL | 5 refills | Status: AC

## 2024-06-27 MED ORDER — TRAZODONE HCL 100 MG PO TABS: 100.0000 mg | ORAL_TABLET | Freq: Every evening | ORAL | 3 refills | Status: AC | PRN
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
## 2024-07-18 ENCOUNTER — Ambulatory Visit: Admitting: Obstetrics and Gynecology
